# Patient Record
Sex: Male | Born: 1950
Health system: Southern US, Community
[De-identification: ages and names within clinical notes are randomized; demographics above are authoritative.]

## PROBLEM LIST (undated history)

## (undated) DIAGNOSIS — IMO0002 Reserved for concepts with insufficient information to code with codable children: Secondary | ICD-10-CM

## (undated) DIAGNOSIS — J45909 Unspecified asthma, uncomplicated: Secondary | ICD-10-CM

## (undated) DIAGNOSIS — M1990 Unspecified osteoarthritis, unspecified site: Secondary | ICD-10-CM

## (undated) DIAGNOSIS — R911 Solitary pulmonary nodule: Secondary | ICD-10-CM

## (undated) DIAGNOSIS — N189 Chronic kidney disease, unspecified: Secondary | ICD-10-CM

## (undated) DIAGNOSIS — E7800 Pure hypercholesterolemia, unspecified: Secondary | ICD-10-CM

## (undated) DIAGNOSIS — K56609 Unspecified intestinal obstruction, unspecified as to partial versus complete obstruction: Secondary | ICD-10-CM

## (undated) DIAGNOSIS — Z860100 Personal history of colon polyps, unspecified: Secondary | ICD-10-CM

## (undated) DIAGNOSIS — I2510 Atherosclerotic heart disease of native coronary artery without angina pectoris: Secondary | ICD-10-CM

## (undated) DIAGNOSIS — C61 Malignant neoplasm of prostate: Secondary | ICD-10-CM

## (undated) DIAGNOSIS — I10 Essential (primary) hypertension: Secondary | ICD-10-CM

## (undated) DIAGNOSIS — K219 Gastro-esophageal reflux disease without esophagitis: Secondary | ICD-10-CM

## (undated) DIAGNOSIS — IMO0001 Reserved for inherently not codable concepts without codable children: Secondary | ICD-10-CM

## (undated) HISTORY — PX: POLYPECTOMY: SHX149

## (undated) HISTORY — DX: Reserved for concepts with insufficient information to code with codable children: IMO0002

## (undated) HISTORY — DX: Personal history of colonic polyps: Z86.010

## (undated) HISTORY — DX: Unspecified osteoarthritis, unspecified site: M19.90

## (undated) HISTORY — DX: Unspecified intestinal obstruction, unspecified as to partial versus complete obstruction: K56.609

## (undated) HISTORY — PX: UPPER GASTROINTESTINAL ENDOSCOPY: SHX188

## (undated) HISTORY — DX: Solitary pulmonary nodule: R91.1

## (undated) HISTORY — DX: Gastro-esophageal reflux disease without esophagitis: K21.9

## (undated) HISTORY — DX: Atherosclerotic heart disease of native coronary artery without angina pectoris: I25.10

## (undated) HISTORY — PX: TONSILLECTOMY: SUR1361

## (undated) HISTORY — DX: Personal history of colon polyps, unspecified: Z86.0100

## (undated) HISTORY — PX: COLONOSCOPY: SHX174

## (undated) HISTORY — DX: Reserved for inherently not codable concepts without codable children: IMO0001

---

## 2001-05-28 ENCOUNTER — Inpatient Hospital Stay (HOSPITAL_COMMUNITY): Admission: EM | Admit: 2001-05-28 | Discharge: 2001-05-30 | Payer: Self-pay | Admitting: Emergency Medicine

## 2001-05-28 ENCOUNTER — Encounter: Payer: Self-pay | Admitting: Emergency Medicine

## 2001-05-29 ENCOUNTER — Encounter: Payer: Self-pay | Admitting: General Surgery

## 2006-12-11 ENCOUNTER — Ambulatory Visit: Payer: Self-pay | Admitting: Family Medicine

## 2007-01-21 ENCOUNTER — Ambulatory Visit: Payer: Self-pay | Admitting: Internal Medicine

## 2007-01-21 ENCOUNTER — Encounter: Payer: Self-pay | Admitting: Internal Medicine

## 2007-01-21 ENCOUNTER — Ambulatory Visit (HOSPITAL_COMMUNITY): Admission: RE | Admit: 2007-01-21 | Discharge: 2007-01-21 | Payer: Self-pay | Admitting: Internal Medicine

## 2008-12-17 ENCOUNTER — Encounter: Payer: Self-pay | Admitting: Cardiology

## 2008-12-23 ENCOUNTER — Ambulatory Visit: Payer: Self-pay | Admitting: Cardiology

## 2010-11-01 NOTE — Op Note (Signed)
NAME:  Gregory Malone, Gregory Malone            ACCOUNT NO.:  [id number]   MEDICAL RECORD NO.:  [id number]          PATIENT TYPE:  AMB   LOCATION:  DAY                           FACILITY:  [facility]   PHYSICIAN:  R. Roetta Sessions, M.D. DATE OF BIRTH:  1950-08-04   DATE OF PROCEDURE:  01/21/2007  DATE OF DISCHARGE:                               OPERATIVE REPORT   Colonoscopy with biopsy, snare polypectomy.   INDICATIONS FOR PROCEDURE:  The patient is a pleasant, 60 year old,  Caucasian male sent over courtesy of Dr. Joette Catching for colorectal  cancer screening.  He has never his lower GI tract imaged.  He tells me  he has a family history of diverticulosis but no history of colorectal  cancer.  He has no lower GI tract symptoms.  Colonoscopy is now being  done as standard screening maneuver.  This approach has discussed the  patient at length.  Potential risks, benefits and alternatives have been  reviewed, questions answered.  She is agreeable.  Please see  documentation in the medical record.   PROCEDURE NOTE:  O2 saturation, blood pressure, pulse and respirations  were monitored throughout the entire procedure.  Conscious sedation with  Versed 4 mg IV and Demerol 75 mg IV divided doses.   INSTRUMENT:  Pentax video chip system.   FINDINGS:  Digital rectal exam revealed no abnormalities.   ENDOSCOPIC FINDINGS:  The prep was adequate.   Colon:  Colonic mucosa was surveyed from rectosigmoid junction through  the left, transverse, and right colon to area of the appendiceal  orifice, ileocecal valve and cecum.  These structures were well seen and  photographed for the record.  From this level, scope was slowly and  cautiously withdrawn.  All previously mentioned mucosal surfaces were  again seen.  There were two diminutive polyps in the mid ascending colon  which were cold biopsied/removed.  The patient had a 7-mm polyp on a  stalk in the sigmoid colon which was hot snared, removed and  recovered.  The patient had left-sided diverticula.  Remainder of the colonic mucosa  appeared normal.  Scope was pulled down to the rectum where thorough  examination of the rectal mucosa including retroflexed view of the anal  verge demonstrated no rectal mucosal abnormalities.  The patient  tolerated the procedure well and was reactive to endoscopy.   IMPRESSION:  1. Normal rectum.  2. Left-sided diverticula.  3. Diminutive descending polyps cold biopsied/removed.  4. Pedunculated sigmoid polyp status post hot snare removal.   RECOMMENDATIONS:  1. No aspirin or arthritis medications for the next 10 days.  2. Diverticulosis and polyp literature provided for Gregory Malone.  3. Follow-up on pathology.  4. Further recommendations to follow.      Jonathon Bellows, M.D.  Electronically Signed     RMR/MEDQ  D:  01/21/2007  T:  01/21/2007  Job:  [id number]   cc:   Delaney Meigs, M.D.  Fax: [phone]

## 2010-11-01 NOTE — Assessment & Plan Note (Signed)
[facility]                            CARDIOLOGY OFFICE NOTE   Gregory Malone, Gregory Malone                   MRN:          [id number]  DATE:12/23/2008                            DOB:          [birth year]    PRIMARY ARE PHYSICIAN:  Helene Kelp, PA, [facility].   REASON FOR PRESENTATION:  Evaluate the patient with abnormal EKG.   HISTORY OF PRESENT ILLNESS:  The patient is a 60 year old gentleman with  no prior cardiac history.  Recently, during a routine physical, he was  noted to have an EKG with frequent premature atrial contractions.  However, the patient does not feel these contractions.  In particular,  he denies any palpitations.  He has never had any presyncope or syncope.  He says he was drinking quite a bit of caffeine at the time of that  appointment.  He denies any other cardiovascular symptoms and said that  he walked 6 miles recently without any problems.  Does not get any chest  pressure, neck, or arm discomfort.  He does not have any shortness of  breath and denies any PND or orthopnea.   PAST MEDICAL HISTORY:  Hypertension x5 years, nephrolithiasis, and  bursitis in his toes.   ALLERGIES:  None.   MEDICATIONS:  1. Hydrochlorothiazide 25 mg daily.  2. Amlodipine 5 mg daily.  3. Aspirin.   SOCIAL HISTORY:  The patient is divorced.  He has no children.  He is  retired.  He quit smoking in 1994 after 1 pack per day for 10 years.  He  does drink occasional beer.   REVIEW OF SYSTEMS:  As stated in the HPI, positive for snoring, reflux.  Negative for all other systems.   PHYSICAL EXAMINATION:  GENERAL:  The patient is pleasant and in no  distress.  VITAL SIGNS:  Blood pressure 124/62, heart rate 106 and regular, weight  238 pounds, and body mass index 39.  HEENT:  Eyelids are unremarkable; pupils equal, round, and reactive to  light; fundi not visualized; oral mucosa unremarkable.  NECK:  No jugular venous  distention at 45 degrees; carotid upstroke  brisk and symmetric; no bruits, no thyromegaly.  LYMPHATICS:  No cervical, axillary, or inguinal adenopathy.  LUNGS:  Clear to auscultation bilaterally.  BACK:  No costovertebral angle tenderness.  CHEST:  Unremarkable.  HEART:  PMI not displaced or sustained; S1 and S2 within normal limits;  no S3, no S4; no clicks, no rubs, no murmurs.  ABDOMEN:  Obese; positive  bowel sounds; normal in frequency and pitch; no bruits, no rebound, no  guarding; no midline pulsatile mass, no hepatomegaly, no splenomegaly.  SKIN:  No rashes, no nodules.  EXTREMITIES:  2+ pulses throughout; no edema, no cyanosis, no clubbing.  NEUROLOGIC:  Grossly intact.   EKG:  Sinus rhythm with premature atrial contractions, axis within  normal limits, intervals within normal limits, and no acute ST-T wave  changes.   ASSESSMENT AND PLAN:  1. Premature atrial contraction.  The patient has asymptomatic PACs.      At this point, no further cardiovascular testing is suggested.  I      do not suspect structural heart disease.  If these become      problematic in the future, he would need to reduce his caffeine      first, but I would be happy to reevaluate.  2. Obesity.  We had a long discussion about this.  He has particular      ideas about diet.  The bottom line is he needs to count calories      and I would suggest a low-glycemic index diet such as [facility].  3. Followup can be back in this clinic as needed.     Rollene Rotunda, MD, [facility]  Electronically Signed    JH/MedQ  DD: 12/23/2008  DT: 12/24/2008  Job #: [id number]   cc:   Helene Kelp, PA

## 2010-11-04 NOTE — H&P (Signed)
[facility]  Patient:    Gregory Malone, Gregory Malone Visit Number: [id number] MRN: [id number]          Service Type: MED Location: 3A [id number] Attending Physician:  Herbert Seta Dictated by:   Franky Macho, M.D. Admit Date:  05/28/2001   CC:         Colon Flattery, D.O.   History and Physical  PATIENT AGE:  60 years old.  REASON FOR ADMISSION:  Bowel obstruction.  HISTORY OF PRESENT ILLNESS:  The patient is a 60 year old white male who started having diarrhea and nonspecific abdominal pain four days ago.  He was seen by his primary care physician and was felt to have a type of viral syndrome versus gastroenteritis.  He presented to the emergency room today with worsening abdominal distention, nausea, and vomiting.  His diarrhea has since resolved.  He denies any fever or chills.  He denies any previous abdominal surgeries.  He denies any hematemesis, melena, hematochezia.  He had a recent rectal examination which was negative for blood.  There is no family history of colon carcinoma.  PAST MEDICAL HISTORY:  Includes hypertension and gout.  PAST SURGICAL HISTORY:  Unremarkable.  CURRENT MEDICATIONS:  Avapro for blood pressure, allopurinol.  ALLERGIES:  No known drug allergies.  REVIEW OF SYSTEMS:  Unremarkable.  PHYSICAL EXAMINATION:  GENERAL:  The patient is well-developed and well-nourished white male who is lying on the cart in mild discomfort.  VITAL SIGNS:  He is afebrile, and vital signs are stable.  LUNGS:  Clear to auscultation with equal breath sounds bilaterally.  HEART:  Regular rate and rhythm without S3, S4, or murmurs.  ABDOMEN:  Significantly distended with a reducible umbilical hernia.  No inguinal hernias are noted.  No hepatosplenomegaly or masses are noted.  RECTAL:  Deferred due to patient discomfort.  LABORATORY DATA:  MET-7 remarkable for potassium of 3.3, glucose 149.  Liver enzymes tests were within normal limits.   Amylase and lipase are within normal limits.  White blood cell count 15.1 with 87 segs, 8 lymphocytes.  Hematocrit 48, platelet count 240.  Acute abdominal series reveals significant small-bowel distention with some air noted in the right colon.  This is consistent with either a small-bowel obstruction or ileus.  Colon obstruction cannot be ruled out at this time.  IMPRESSION:  Bowel obstruction, question ileus versus mechanical, with an umbilical hernia.  PLAN:  The patient will be admitted to the hospital for intravenous hydration and nasogastric tube for decompression.  A CT scan of the abdomen and pelvis has been ordered for this evening.  Further management is pending the CT scan results. Dictated by:   Franky Macho, M.D. Attending Physician:  Herbert Seta DD:  05/28/01 TD:  05/28/01 Job: [id number]

## 2010-11-04 NOTE — Discharge Summary (Signed)
[facility]  Patient:    Gregory Malone, Gregory Malone Visit Number: [id number] MRN: [id number]          Service Type: MED Location: 3A [phone] Attending Physician:  Dalia Heading Dictated by:   Franky Macho, M.D. Admit Date:  05/28/2001 Discharge Date: 05/30/2001   CC:         Colon Flattery, D.O.   Discharge Summary  HOSPITAL COURSE:  The patient is a 60 year old white male who presented to the emergency room with abdominal distention, nausea, vomiting.  Abdominal films were obtained which revealed significant small bowel distention with air in the colon.  This was consistent with either a small bowel obstruction or an ileus.  A colonic obstruction could not be ruled out at the time.  A CT scan of the abdomen and pelvis was performed which revealed a partial small bowel obstruction with some narrowing at the terminal ileum.  No bowel wall thickening was noted and there was no completion of the obstruction.  The patients potassium was also noted to be low.  His white blood cell count was noted to be elevated at 15.1 at the time of admission.  He was admitted to the hospital under the surgical service for further evaluation and treatment.  He was started on Unasyn.  His white blood cell count subsequently dropped to 11.4.  A NG tube was attempted, but the patient did not tolerate it.  His potassium was corrected.  His follow up abdominal films revealed mild small bowel distention though it had somewhat resolved. His diet was then advanced without difficulty.  On hospital day #2, he was able to tolerate a soft diet without difficulty. He is having bowel movements and his abdominal pain has resolved.  His abdomen is less distended and there is no evidence of rigidity.  At this point, he did not have any evidence of an acute surgical abdomen.  The patient is being discharged home in good and improving condition.  DISCHARGE INSTRUCTIONS:  The patient is to  follow up with Dr. Franky Macho on June 04, 2001.  DISCHARGE MEDICATIONS: 1. Ciprofloxacin 500 mg p.o. b.i.d. x 5 days. 2. He is to resume his pre hospital medications as prescribed.  PRINCIPLE DIAGNOSES: 1. Partial small bowel obstruction, resolving, etiology unknown. 2. Hypertension. 3. History of gout.  PRINCIPLE PROCEDURES:  None. Dictated by:   Franky Macho, M.D. Attending Physician:  Dalia Heading DD:  05/30/01 TD:  05/30/01 Job: [id number] NF/[id number]

## 2012-03-26 HISTORY — PX: PROSTATE BIOPSY: SHX241

## 2012-05-13 ENCOUNTER — Encounter: Payer: Self-pay | Admitting: Radiation Oncology

## 2012-05-13 DIAGNOSIS — C61 Malignant neoplasm of prostate: Secondary | ICD-10-CM | POA: Insufficient documentation

## 2012-05-15 ENCOUNTER — Ambulatory Visit: Payer: Self-pay

## 2012-05-15 ENCOUNTER — Encounter: Payer: Self-pay | Admitting: Radiation Oncology

## 2012-05-15 ENCOUNTER — Ambulatory Visit
Admission: RE | Admit: 2012-05-15 | Discharge: 2012-05-15 | Disposition: A | Discharge status: Home / Self Care | Payer: PRIVATE HEALTH INSURANCE | Source: Ambulatory Visit | Attending: Radiation Oncology | Admitting: Radiation Oncology

## 2012-05-15 ENCOUNTER — Ambulatory Visit: Payer: Self-pay | Admitting: Radiation Oncology

## 2012-05-15 VITALS — BP 108/75 | HR 89 | Temp 98.3°F | Resp 20 | Ht 64.0 in | Wt 237.0 lb

## 2012-05-15 DIAGNOSIS — J45909 Unspecified asthma, uncomplicated: Secondary | ICD-10-CM | POA: Insufficient documentation

## 2012-05-15 DIAGNOSIS — C61 Malignant neoplasm of prostate: Secondary | ICD-10-CM

## 2012-05-15 DIAGNOSIS — I129 Hypertensive chronic kidney disease with stage 1 through stage 4 chronic kidney disease, or unspecified chronic kidney disease: Secondary | ICD-10-CM | POA: Insufficient documentation

## 2012-05-15 DIAGNOSIS — N189 Chronic kidney disease, unspecified: Secondary | ICD-10-CM | POA: Insufficient documentation

## 2012-05-15 HISTORY — DX: Unspecified asthma, uncomplicated: J45.909

## 2012-05-15 HISTORY — DX: Essential (primary) hypertension: I10

## 2012-05-15 HISTORY — DX: Malignant neoplasm of prostate: C61

## 2012-05-15 HISTORY — DX: Pure hypercholesterolemia, unspecified: E78.00

## 2012-05-15 HISTORY — DX: Chronic kidney disease, unspecified: N18.9

## 2012-05-15 NOTE — Progress Notes (Signed)
Please see the Nurse Progress Note in the MD Initial Consult Encounter for this patient. 

## 2012-05-15 NOTE — Progress Notes (Addendum)
[facility] Radiation Oncology NEW PATIENT EVALUATION  Name: Gregory Malone MRN: [id number]  Date:   05/15/2012           DOB: 1950-10-24  Status: outpatient   CC:   Valetta Fuller, MD   Helene Kelp. PA   REFERRING PHYSICIAN: Valetta Fuller, MD   DIAGNOSIS: Stage TI C. favorable risk adenocarcinoma prostate   HISTORY OF PRESENT ILLNESS:  Gregory Malone is a 61 y.o. male who is seen today for the courtesy of Dr. Isabel Caprice for discussion of possible radiation therapy in the management of his stage TI C. favorable risk adenocarcinoma prostate. He was noted to have a rise in his PSA from 2.3 in 2012 to 4.22 this past 01/29/2012. He was referred by Helene Kelp, PA  to Dr. Isabel Caprice for further evaluation. He underwent ultrasound-guided biopsies on 03/26/2012 with a diagnosis of adenocarcinoma, Gleason 6 (3+3) involving 20% of one core from the left base and 5% of one core from the left lateral apex. He noted areas of high-grade PIN and atypia. His gland volume was approximately 34 cc. He is doing well from a GU and GI standpoint. His I PSS score is 4. He is potent.  PREVIOUS RADIATION THERAPY: No   PAST MEDICAL HISTORY:  has a past medical history of Prostate cancer (03/26/12 bx); Asthma; Hypertension; Hypercholesterolemia; and Chronic kidney disease.     PAST SURGICAL HISTORY:  Past Surgical History  Procedure Date  . Tonsillectomy   . Prostate biopsy 03/26/12    adenocarcinoma     FAMILY HISTORY: family history includes Cancer in his cousin and paternal uncle; Cancer (age of onset:50) in his maternal grandfather; and Cancer (age of onset:71) in his father.  I treated father for prostate cancer almost 18 years ago at the [facility] in [place] . He died at age 32 of unrelated causes. His mother is alive with Alzheimer's disease at age 91. His maternal grandfather was diagnosed with prostate cancer at age 59.   SOCIAL HISTORY:  reports that he has quit  smoking. His smoking use included Cigarettes. He has a 10 pack-year smoking history. He has never used smokeless tobacco. He reports that he drinks alcohol. He reports that he does not use illicit drugs. Divorced, no children. 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   ALLERGIES: Review of patient's allergies indicates no known allergies.   MEDICATIONS:  Current Outpatient Prescriptions  Medication Sig Dispense Refill  . amLODipine (NORVASC) 5 MG tablet Take 5 mg by mouth daily.      Marland Kitchen atorvastatin (LIPITOR) 40 MG tablet Take 40 mg by mouth daily.      . hydrochlorothiazide (HYDRODIURIL) 25 MG tablet Take 25 mg by mouth daily.      Marland Kitchen ibuprofen (ADVIL,MOTRIN) 200 MG tablet Take 200 mg by mouth every 6 (six) hours as needed.         REVIEW OF SYSTEMS:  Pertinent items are noted in HPI.    PHYSICAL EXAM:  height is 5\' 4"  (1.626 m) and weight is 237 lb (107.502 kg). His oral temperature is 98.3 F (36.8 C). His blood pressure is 108/75 and his pulse is 89. His respiration is 20.   Alert and oriented. Head and neck examination: Grossly unremarkable. Nodes: Without palpable cervical or supraclavicular lymphadenopathy. Chest: Lungs clear. Back: Without spinal or CVA tenderness. Heart: Regular rate and  rhythm. Abdomen: Without masses organomegaly. Genitalia: Unremarkable to inspection. Rectal: Prostate gland is normal in size and is without focal induration or nodularity. Extremities: Without edema. Neurologic examination: Grossly nonfocal.   LABORATORY DATA:  No results found for this basename: WBC, HGB, HCT, MCV, PLT   No results found for this basename: NA, K, CL, CO2   No results found for this basename: ALT, AST, GGT, ALKPHOS, BILITOT   PSA 4.22 from 01/29/2012.   IMPRESSION: Stage TI C. favorable risk adenocarcinoma prostate. I explained to the patient that his prognosis is related to his stage, PSA level, and  Gleason score. All are favorable. We discussed surgery versus close surveillance versus radiation therapy. We discussed his radiation therapy options which include seed implantation alone or 8 weeks of external beam/IMRT. We discussed the potential acute and late toxicities of radiation therapy. I think a seed implant would be an excellent choice for him. We talked about the need to obtain a CT arch study prior to scheduling a seed implant. He will think things over and get back in touch with me after he is made a final decision. He was given seed implantation literature for review.   PLAN: As discussed above.   I spent 60 minutes minutes face to face with the patient and more than 50% of that time was spent in counseling and/or coordination of care.

## 2012-05-15 NOTE — Addendum Note (Signed)
Encounter addended by: Delynn Flavin, RN on: 05/15/2012  7:22 PM<BR>     Documentation filed: Charges VN

## 2012-05-15 NOTE — Progress Notes (Signed)
New Consult Prostate Cancer BX=03/26/12=Adenocarcinoma gleason= 3+3=6,PSA=4.22,volume=34cc PSA this year 4.22 PSA 2012=2.3 Single, Retired Chemical engineer Alert,oriented x3, no dysuria, no pain, interested in Radiation only here    His  Father  along with his  ,maternal grandfather, Kateri Mc,  And cousin dx with prostate cancer His Father died leukemia not prostate cancer, he was treated with radiation with Dr.Murray 08/24/1993 in [facility]     Allergies:NKDA

## 2012-05-27 ENCOUNTER — Telehealth: Payer: Self-pay | Admitting: *Deleted

## 2012-05-27 ENCOUNTER — Encounter: Payer: Self-pay | Admitting: Radiation Oncology

## 2012-05-27 NOTE — Telephone Encounter (Signed)
Called patient to inform of sim appt. For 06-17-12  At 11:00 am, spoke with patient and he is aware of this appt.

## 2012-05-27 NOTE — Telephone Encounter (Signed)
xxxx 

## 2012-05-27 NOTE — Progress Notes (Signed)
The patient called back, and would like to proceed with IMRT rather than seed implantation.

## 2012-05-28 ENCOUNTER — Institutional Professional Consult (permissible substitution): Payer: Self-pay | Admitting: Radiation Oncology

## 2012-05-28 ENCOUNTER — Ambulatory Visit: Payer: Self-pay

## 2012-06-14 ENCOUNTER — Telehealth: Payer: Self-pay | Admitting: *Deleted

## 2012-06-14 NOTE — Telephone Encounter (Signed)
CALLED PATIENT TO REMIND OF APPT. FOR 06-17-12 , SPOKE WITH PATIENT AND HE IS AWARE OF THIS APPT.

## 2012-06-17 ENCOUNTER — Encounter: Payer: Self-pay | Admitting: Radiation Oncology

## 2012-06-17 ENCOUNTER — Ambulatory Visit
Admission: RE | Admit: 2012-06-17 | Discharge: 2012-06-17 | Disposition: A | Discharge status: Home / Self Care | Payer: PRIVATE HEALTH INSURANCE | Source: Ambulatory Visit | Attending: Radiation Oncology | Admitting: Radiation Oncology

## 2012-06-17 DIAGNOSIS — C61 Malignant neoplasm of prostate: Secondary | ICD-10-CM

## 2012-06-17 DIAGNOSIS — Z510 Encounter for antineoplastic radiation therapy: Secondary | ICD-10-CM | POA: Insufficient documentation

## 2012-06-17 NOTE — Progress Notes (Signed)
Simulation/treatment planning note:  Gregory Malone underwent simulation/treatment planning in the management of his favorable risk adenocarcinoma prostate. A Vac Loc immobilization device was constructed. A red rubber catheter was placed in the rectal vault. He was then catheterized and contrast instilled into the bladder/urethra. I contoured his prostate, seminal vesicles, rectum, bladder, and rectosigmoid colon. I am prescribing 7600 cGy in 38 sessions utilizing 6 MV photons VMAT IMRT. I prescribing 5700 cGy 30 sessions to his seminal vesicles. He'll undergo daily KV imaging sitting up to his gold seeds and weekly cone beam CT to assess his bladder filling. He is now ready for IMRT simulation/treatment planning.

## 2012-06-17 NOTE — Progress Notes (Signed)
Met with patient to discuss RO billing.  Patient had no concerns today.  Told patient IMRT was already approved.

## 2012-06-25 ENCOUNTER — Encounter: Payer: Self-pay | Admitting: Radiation Oncology

## 2012-06-25 NOTE — Progress Notes (Signed)
IMRT simulation/treatment planning note:  The patient completed his IMRT simulation/treatment planning in the management of his carcinoma the prostate. IMRT was chosen to decrease the risk for both acute and late bladder toxicity compared to conventional or 3-D conformal radiation therapy. Dose volume histograms were obtained for the target structures including the prostate and seminal vesicles and also avoidance structures including the bladder, rectum and femoral heads. He had a small rectal diameter, and thus was difficult to meet our avoidance goals with respect to the rectum. He is being treated with 3 volume modulated arcs with 10 MV photons. I accepted a PTV coverage of just over 95% for his prescription dose to the prostate in order to limit the dose to his rectum. Otherwise, we met the departmental goals. He'll undergo daily KV imaging, setting up to his 3 gold seeds and also a weekly cone beam CT scan to assess his bladder filling. Please see the electronic medical record for specific dose volume histograms.

## 2012-06-27 ENCOUNTER — Ambulatory Visit
Admission: RE | Admit: 2012-06-27 | Discharge: 2012-06-27 | Disposition: A | Discharge status: Home / Self Care | Payer: PRIVATE HEALTH INSURANCE | Source: Ambulatory Visit | Attending: Radiation Oncology | Admitting: Radiation Oncology

## 2012-06-27 DIAGNOSIS — C61 Malignant neoplasm of prostate: Secondary | ICD-10-CM

## 2012-06-27 NOTE — Progress Notes (Signed)
Simulation verification note: The patient was simulation verification prior to his first treatment in the management of his carcinoma the prostate. He underwent KV imaging, setting up to his gold seeds.

## 2012-06-28 ENCOUNTER — Ambulatory Visit
Admission: RE | Admit: 2012-06-28 | Discharge: 2012-06-28 | Disposition: A | Discharge status: Home / Self Care | Payer: PRIVATE HEALTH INSURANCE | Source: Ambulatory Visit | Attending: Radiation Oncology | Admitting: Radiation Oncology

## 2012-07-01 ENCOUNTER — Ambulatory Visit
Admission: RE | Admit: 2012-07-01 | Discharge: 2012-07-01 | Disposition: A | Discharge status: Home / Self Care | Payer: PRIVATE HEALTH INSURANCE | Source: Ambulatory Visit | Attending: Radiation Oncology | Admitting: Radiation Oncology

## 2012-07-01 ENCOUNTER — Encounter: Payer: Self-pay | Admitting: Radiation Oncology

## 2012-07-01 VITALS — BP 133/84 | HR 93 | Temp 99.1°F | Resp 20 | Wt 242.1 lb

## 2012-07-01 DIAGNOSIS — C61 Malignant neoplasm of prostate: Secondary | ICD-10-CM

## 2012-07-01 NOTE — Progress Notes (Signed)
Post sim ed completed; documented under post sim appt. Pt denies pain, urinary, bowel issues, fatigue, loss of appetite.

## 2012-07-01 NOTE — Progress Notes (Signed)
Weekly Management Note:  Site: Prostate Current Dose:  600  cGy Projected Dose: 7600  cGy  Narrative: The patient is seen today for routine under treatment assessment. CBCT/MVCT images/port films were reviewed. The chart was reviewed.   Bladder filling is satisfactory. No new GU or GI difficulties.  Physical Examination:  Filed Vitals:   07/01/12 1440  BP: 133/84  Pulse: 93  Temp: 99.1 F (37.3 C)  Resp: 20  .  Weight: 242 lb 1.6 oz (109.816 kg). No change .  Impression: Tolerating radiation therapy well.  Plan: Continue radiation therapy as planned.

## 2012-07-02 ENCOUNTER — Ambulatory Visit
Admission: RE | Admit: 2012-07-02 | Discharge: 2012-07-02 | Disposition: A | Discharge status: Home / Self Care | Payer: PRIVATE HEALTH INSURANCE | Source: Ambulatory Visit | Attending: Radiation Oncology | Admitting: Radiation Oncology

## 2012-07-02 NOTE — Progress Notes (Signed)
Post sim ed completed w/pt. Gave pt "Radiation and You" booklet w/all pertinent pages/information marked and discussed, re: fatigue, diarrhea, skin care, nutrition, pain, urinary/bladder changes/management. All questions answered.

## 2012-07-03 ENCOUNTER — Ambulatory Visit
Admission: RE | Admit: 2012-07-03 | Discharge: 2012-07-03 | Disposition: A | Discharge status: Home / Self Care | Payer: PRIVATE HEALTH INSURANCE | Source: Ambulatory Visit | Attending: Radiation Oncology | Admitting: Radiation Oncology

## 2012-07-04 ENCOUNTER — Ambulatory Visit
Admission: RE | Admit: 2012-07-04 | Discharge: 2012-07-04 | Disposition: A | Discharge status: Home / Self Care | Payer: PRIVATE HEALTH INSURANCE | Source: Ambulatory Visit | Attending: Radiation Oncology | Admitting: Radiation Oncology

## 2012-07-05 ENCOUNTER — Ambulatory Visit
Admission: RE | Admit: 2012-07-05 | Discharge: 2012-07-05 | Disposition: A | Discharge status: Home / Self Care | Payer: PRIVATE HEALTH INSURANCE | Source: Ambulatory Visit | Attending: Radiation Oncology | Admitting: Radiation Oncology

## 2012-07-08 ENCOUNTER — Ambulatory Visit
Admission: RE | Admit: 2012-07-08 | Discharge: 2012-07-08 | Disposition: A | Discharge status: Home / Self Care | Payer: PRIVATE HEALTH INSURANCE | Source: Ambulatory Visit | Attending: Radiation Oncology | Admitting: Radiation Oncology

## 2012-07-08 ENCOUNTER — Encounter: Payer: Self-pay | Admitting: Radiation Oncology

## 2012-07-08 VITALS — BP 111/68 | HR 74 | Temp 98.4°F | Resp 20 | Wt 239.7 lb

## 2012-07-08 DIAGNOSIS — C61 Malignant neoplasm of prostate: Secondary | ICD-10-CM

## 2012-07-08 NOTE — Progress Notes (Signed)
Weekly Management Note:  Site: Prostate Current Dose:  1600  cGy Projected Dose: 7600  cGy  Narrative: The patient is seen today for routine under treatment assessment. CBCT/MVCT images/port films were reviewed. The chart was reviewed.   Satisfactory bladder filling. No GU or GI difficulties.  Physical Examination:  Filed Vitals:   07/08/12 1001  BP: 111/68  Pulse: 74  Temp: 98.4 F (36.9 C)  Resp: 20  .  Weight: 239 lb 11.2 oz (108.727 kg). No change.  Impression: Tolerating radiation therapy well.  Plan: Continue radiation therapy as planned.

## 2012-07-08 NOTE — Progress Notes (Signed)
Patient here for weekly rad txs prostate  8/38  Completed,  No dysuria, no frequency or urgency, bowels regular no c/o 10:05 AM

## 2012-07-09 ENCOUNTER — Ambulatory Visit
Admission: RE | Admit: 2012-07-09 | Discharge: 2012-07-09 | Disposition: A | Discharge status: Home / Self Care | Payer: PRIVATE HEALTH INSURANCE | Source: Ambulatory Visit | Attending: Radiation Oncology | Admitting: Radiation Oncology

## 2012-07-10 ENCOUNTER — Ambulatory Visit
Admission: RE | Admit: 2012-07-10 | Discharge: 2012-07-10 | Disposition: A | Discharge status: Home / Self Care | Payer: PRIVATE HEALTH INSURANCE | Source: Ambulatory Visit | Attending: Radiation Oncology | Admitting: Radiation Oncology

## 2012-07-11 ENCOUNTER — Ambulatory Visit
Admission: RE | Admit: 2012-07-11 | Discharge: 2012-07-11 | Disposition: A | Discharge status: Home / Self Care | Payer: PRIVATE HEALTH INSURANCE | Source: Ambulatory Visit | Attending: Radiation Oncology | Admitting: Radiation Oncology

## 2012-07-12 ENCOUNTER — Ambulatory Visit
Admission: RE | Admit: 2012-07-12 | Discharge: 2012-07-12 | Disposition: A | Discharge status: Home / Self Care | Payer: PRIVATE HEALTH INSURANCE | Source: Ambulatory Visit | Attending: Radiation Oncology | Admitting: Radiation Oncology

## 2012-07-15 ENCOUNTER — Encounter: Payer: Self-pay | Admitting: Radiation Oncology

## 2012-07-15 ENCOUNTER — Ambulatory Visit
Admission: RE | Admit: 2012-07-15 | Discharge: 2012-07-15 | Disposition: A | Discharge status: Home / Self Care | Payer: PRIVATE HEALTH INSURANCE | Source: Ambulatory Visit | Attending: Radiation Oncology | Admitting: Radiation Oncology

## 2012-07-15 VITALS — BP 125/87 | HR 88 | Temp 97.3°F | Resp 20 | Wt 239.0 lb

## 2012-07-15 DIAGNOSIS — C61 Malignant neoplasm of prostate: Secondary | ICD-10-CM

## 2012-07-15 NOTE — Progress Notes (Signed)
Pt reports slight fatigue twice last week. He denies urinary, bowel issues, loss of appetite.

## 2012-07-15 NOTE — Progress Notes (Signed)
Weekly Management Note:  Site: Prostate Current Dose:  2600  cGy Projected Dose: 7600  cGy  Narrative: The patient is seen today for routine under treatment assessment. CBCT/MVCT images/port films were reviewed. The chart was reviewed.   Bladder filling satisfactory today. No significant GU or GI difficulties.  Physical Examination:  Filed Vitals:   07/15/12 1009  BP: 125/87  Pulse: 88  Temp: 97.3 F (36.3 C)  Resp: 20  .  Weight: 239 lb (108.41 kg). No change.  Impression: Tolerating radiation therapy well.  Plan: Continue radiation therapy as planned.

## 2012-07-16 ENCOUNTER — Ambulatory Visit
Admission: RE | Admit: 2012-07-16 | Discharge: 2012-07-16 | Disposition: A | Discharge status: Home / Self Care | Payer: PRIVATE HEALTH INSURANCE | Source: Ambulatory Visit | Attending: Radiation Oncology | Admitting: Radiation Oncology

## 2012-07-17 ENCOUNTER — Ambulatory Visit
Admission: RE | Admit: 2012-07-17 | Discharge: 2012-07-17 | Disposition: A | Discharge status: Home / Self Care | Payer: PRIVATE HEALTH INSURANCE | Source: Ambulatory Visit | Attending: Radiation Oncology | Admitting: Radiation Oncology

## 2012-07-18 ENCOUNTER — Ambulatory Visit
Admission: RE | Admit: 2012-07-18 | Discharge: 2012-07-18 | Disposition: A | Discharge status: Home / Self Care | Payer: PRIVATE HEALTH INSURANCE | Source: Ambulatory Visit | Attending: Radiation Oncology | Admitting: Radiation Oncology

## 2012-07-19 ENCOUNTER — Ambulatory Visit
Admission: RE | Admit: 2012-07-19 | Discharge: 2012-07-19 | Disposition: A | Discharge status: Home / Self Care | Payer: PRIVATE HEALTH INSURANCE | Source: Ambulatory Visit | Attending: Radiation Oncology | Admitting: Radiation Oncology

## 2012-07-22 ENCOUNTER — Ambulatory Visit
Admission: RE | Admit: 2012-07-22 | Discharge: 2012-07-22 | Disposition: A | Discharge status: Home / Self Care | Payer: PRIVATE HEALTH INSURANCE | Source: Ambulatory Visit | Attending: Radiation Oncology | Admitting: Radiation Oncology

## 2012-07-22 DIAGNOSIS — C61 Malignant neoplasm of prostate: Secondary | ICD-10-CM

## 2012-07-22 NOTE — Progress Notes (Signed)
S/p rad txs 18/38 completed prostate, no  Dysuria , bowels regular, no changes stated patient, no pain

## 2012-07-22 NOTE — Progress Notes (Signed)
Weekly Management Note:  Site: Prostate Current Dose:  3600  cGy Projected Dose: 7600  cGy  Narrative: The patient is seen today for routine under treatment assessment. CBCT/MVCT images/port films were reviewed. The chart was reviewed.   Bladder filling satisfactory. No GU or GI difficulties.  Physical Examination: There were no vitals filed for this visit..  Weight:  . No change.  Impression: Tolerating radiation therapy well.  Plan: Continue radiation therapy as planned.

## 2012-07-23 ENCOUNTER — Ambulatory Visit
Admission: RE | Admit: 2012-07-23 | Discharge: 2012-07-23 | Disposition: A | Discharge status: Home / Self Care | Payer: PRIVATE HEALTH INSURANCE | Source: Ambulatory Visit | Attending: Radiation Oncology | Admitting: Radiation Oncology

## 2012-07-24 ENCOUNTER — Ambulatory Visit
Admission: RE | Admit: 2012-07-24 | Discharge: 2012-07-24 | Disposition: A | Discharge status: Home / Self Care | Payer: PRIVATE HEALTH INSURANCE | Source: Ambulatory Visit | Attending: Radiation Oncology | Admitting: Radiation Oncology

## 2012-07-25 ENCOUNTER — Ambulatory Visit
Admission: RE | Admit: 2012-07-25 | Discharge: 2012-07-25 | Disposition: A | Discharge status: Home / Self Care | Payer: PRIVATE HEALTH INSURANCE | Source: Ambulatory Visit | Attending: Radiation Oncology | Admitting: Radiation Oncology

## 2012-07-26 ENCOUNTER — Ambulatory Visit
Admission: RE | Admit: 2012-07-26 | Discharge: 2012-07-26 | Disposition: A | Discharge status: Home / Self Care | Payer: PRIVATE HEALTH INSURANCE | Source: Ambulatory Visit | Attending: Radiation Oncology | Admitting: Radiation Oncology

## 2012-07-29 ENCOUNTER — Ambulatory Visit
Admission: RE | Admit: 2012-07-29 | Discharge: 2012-07-29 | Disposition: A | Discharge status: Home / Self Care | Payer: PRIVATE HEALTH INSURANCE | Source: Ambulatory Visit | Attending: Radiation Oncology | Admitting: Radiation Oncology

## 2012-07-29 ENCOUNTER — Encounter: Payer: Self-pay | Admitting: Radiation Oncology

## 2012-07-29 VITALS — BP 119/71 | HR 84 | Temp 98.5°F | Resp 20 | Wt 244.6 lb

## 2012-07-29 DIAGNOSIS — C61 Malignant neoplasm of prostate: Secondary | ICD-10-CM

## 2012-07-29 NOTE — Addendum Note (Signed)
Encounter addended by: Maryln Gottron, MD on: 07/29/2012 11:48 AM<BR>     Documentation filed: Notes Section

## 2012-07-29 NOTE — Progress Notes (Signed)
Patient completed 23/38 prosatte rad txs, no c/o pain, or dysuria, regular bowels, slight fatigue at time,s exercised walking in the woods this weekend,eating well, drinks mostly diet sodas, juices 11:40 AM

## 2012-07-29 NOTE — Progress Notes (Addendum)
Weekly Management Note:  Site: Prostate Current Dose:  4600  cGy Projected Dose: 7600  cGy  Narrative: The patient is seen today for routine under treatment assessment. CBCT/MVCT images/port films were reviewed. The chart was reviewed.   Excellent bladder filling. No new GU or GI difficulties. He does have occasional fatigue.  Physical Examination:  Filed Vitals:   07/29/12 1137  BP: 119/71  Pulse: 84  Temp: 98.5 F (36.9 C)  Resp: 20  .  Weight: 244 lb 9.6 oz (110.95 kg). No change.  Impression: Tolerating radiation therapy well.  Plan: Continue radiation therapy as planned.

## 2012-07-30 ENCOUNTER — Ambulatory Visit
Admission: RE | Admit: 2012-07-30 | Discharge: 2012-07-30 | Disposition: A | Discharge status: Home / Self Care | Payer: PRIVATE HEALTH INSURANCE | Source: Ambulatory Visit | Attending: Radiation Oncology | Admitting: Radiation Oncology

## 2012-07-31 ENCOUNTER — Ambulatory Visit
Admission: RE | Admit: 2012-07-31 | Discharge: 2012-07-31 | Disposition: A | Discharge status: Home / Self Care | Payer: PRIVATE HEALTH INSURANCE | Source: Ambulatory Visit | Attending: Radiation Oncology | Admitting: Radiation Oncology

## 2012-08-01 ENCOUNTER — Ambulatory Visit: Payer: PRIVATE HEALTH INSURANCE

## 2012-08-02 ENCOUNTER — Ambulatory Visit
Admission: RE | Admit: 2012-08-02 | Discharge: 2012-08-02 | Disposition: A | Discharge status: Home / Self Care | Payer: PRIVATE HEALTH INSURANCE | Source: Ambulatory Visit | Attending: Radiation Oncology | Admitting: Radiation Oncology

## 2012-08-05 ENCOUNTER — Ambulatory Visit
Admission: RE | Admit: 2012-08-05 | Discharge: 2012-08-05 | Disposition: A | Discharge status: Home / Self Care | Payer: PRIVATE HEALTH INSURANCE | Source: Ambulatory Visit | Attending: Radiation Oncology | Admitting: Radiation Oncology

## 2012-08-05 ENCOUNTER — Encounter: Payer: Self-pay | Admitting: Radiation Oncology

## 2012-08-05 VITALS — BP 144/84 | HR 97 | Resp 18 | Wt 243.2 lb

## 2012-08-05 DIAGNOSIS — C61 Malignant neoplasm of prostate: Secondary | ICD-10-CM

## 2012-08-05 NOTE — Progress Notes (Signed)
Weekly Management Note:  Site: Prostate Current Dose:  5400  cGy Projected Dose: 7600  cGy  Narrative: The patient is seen today for routine under treatment assessment. CBCT/MVCT images/port films were reviewed. The chart was reviewed.   Excellent bladder filling. No significant GU or GI difficulties. He does report slight dysuria.  Physical Examination:  Filed Vitals:   08/05/12 1127  BP: 144/84  Pulse: 97  Resp: 18  .  Weight: 243 lb 3.2 oz (110.315 kg). No change.   Impression: Tolerating radiation therapy well.  Plan: Continue radiation therapy as planned.

## 2012-08-05 NOTE — Progress Notes (Signed)
Patient presents to the clinic today unaccompanied for PUT with Dr. Dayton Scrape. Patient alert and oriented to person, place, and time. No distress noted. Steady gait noted. Pleasant affect noted. Patient denies pain at this time. Patient denies burning with urination. Patient denies diarrhea. Patient reports on average he gets up once during the night to void. Patient denies hematuria. Patient reports mild fatigue. Reported all findings to Dr. Dayton Scrape

## 2012-08-06 ENCOUNTER — Ambulatory Visit
Admission: RE | Admit: 2012-08-06 | Discharge: 2012-08-06 | Disposition: A | Discharge status: Home / Self Care | Payer: PRIVATE HEALTH INSURANCE | Source: Ambulatory Visit | Attending: Radiation Oncology | Admitting: Radiation Oncology

## 2012-08-07 ENCOUNTER — Ambulatory Visit
Admission: RE | Admit: 2012-08-07 | Discharge: 2012-08-07 | Disposition: A | Discharge status: Home / Self Care | Payer: PRIVATE HEALTH INSURANCE | Source: Ambulatory Visit | Attending: Radiation Oncology | Admitting: Radiation Oncology

## 2012-08-08 ENCOUNTER — Ambulatory Visit
Admission: RE | Admit: 2012-08-08 | Discharge: 2012-08-08 | Disposition: A | Discharge status: Home / Self Care | Payer: PRIVATE HEALTH INSURANCE | Source: Ambulatory Visit | Attending: Radiation Oncology | Admitting: Radiation Oncology

## 2012-08-09 ENCOUNTER — Ambulatory Visit
Admission: RE | Admit: 2012-08-09 | Discharge: 2012-08-09 | Disposition: A | Discharge status: Home / Self Care | Payer: PRIVATE HEALTH INSURANCE | Source: Ambulatory Visit | Attending: Radiation Oncology | Admitting: Radiation Oncology

## 2012-08-12 ENCOUNTER — Ambulatory Visit
Admission: RE | Admit: 2012-08-12 | Discharge: 2012-08-12 | Disposition: A | Discharge status: Home / Self Care | Payer: PRIVATE HEALTH INSURANCE | Source: Ambulatory Visit | Attending: Radiation Oncology | Admitting: Radiation Oncology

## 2012-08-12 VITALS — BP 129/78 | HR 92 | Temp 98.2°F | Wt 243.0 lb

## 2012-08-12 DIAGNOSIS — C61 Malignant neoplasm of prostate: Secondary | ICD-10-CM

## 2012-08-12 NOTE — Progress Notes (Signed)
Mr. Gwynne here for routine put.  He is receiving radiation treatment for prostate cancer. He reports having to urinate 2 times during the night.  He denies pain on urination, incontinence and any problems with his bowels.

## 2012-08-12 NOTE — Progress Notes (Signed)
Weekly Management Note:  Site: Prostate Current Dose:  6400  cGy Projected Dose: 7600  cGy  Narrative: The patient is seen today for routine under treatment assessment. CBCT/MVCT images/port films were reviewed. The chart was reviewed.   Bladder filling is satisfactory. No GU or GI difficulties although he does omit to some increasing urinary frequency/urgency.  Physical Examination:  Filed Vitals:   08/12/12 1800  BP: 129/78  Pulse: 92  Temp: 98.2 F (36.8 C)  .  Weight: 243 lb (110.224 kg). No change.  Impression: Tolerating radiation therapy well.  Plan: Continue radiation therapy as planned.

## 2012-08-13 ENCOUNTER — Ambulatory Visit
Admission: RE | Admit: 2012-08-13 | Discharge: 2012-08-13 | Disposition: A | Discharge status: Home / Self Care | Payer: PRIVATE HEALTH INSURANCE | Source: Ambulatory Visit | Attending: Radiation Oncology | Admitting: Radiation Oncology

## 2012-08-14 ENCOUNTER — Ambulatory Visit
Admission: RE | Admit: 2012-08-14 | Discharge: 2012-08-14 | Disposition: A | Discharge status: Home / Self Care | Payer: PRIVATE HEALTH INSURANCE | Source: Ambulatory Visit | Attending: Radiation Oncology | Admitting: Radiation Oncology

## 2012-08-15 ENCOUNTER — Ambulatory Visit
Admission: RE | Admit: 2012-08-15 | Discharge: 2012-08-15 | Disposition: A | Discharge status: Home / Self Care | Payer: PRIVATE HEALTH INSURANCE | Source: Ambulatory Visit | Attending: Radiation Oncology | Admitting: Radiation Oncology

## 2012-08-16 ENCOUNTER — Ambulatory Visit
Admission: RE | Admit: 2012-08-16 | Discharge: 2012-08-16 | Disposition: A | Discharge status: Home / Self Care | Payer: PRIVATE HEALTH INSURANCE | Source: Ambulatory Visit | Attending: Radiation Oncology | Admitting: Radiation Oncology

## 2012-08-19 ENCOUNTER — Ambulatory Visit
Admission: RE | Admit: 2012-08-19 | Discharge: 2012-08-19 | Disposition: A | Discharge status: Home / Self Care | Payer: PRIVATE HEALTH INSURANCE | Source: Ambulatory Visit | Attending: Radiation Oncology | Admitting: Radiation Oncology

## 2012-08-20 ENCOUNTER — Ambulatory Visit
Admission: RE | Admit: 2012-08-20 | Discharge: 2012-08-20 | Disposition: A | Discharge status: Home / Self Care | Payer: PRIVATE HEALTH INSURANCE | Source: Ambulatory Visit | Attending: Radiation Oncology | Admitting: Radiation Oncology

## 2012-08-20 ENCOUNTER — Encounter: Payer: Self-pay | Admitting: Radiation Oncology

## 2012-08-20 ENCOUNTER — Ambulatory Visit: Payer: PRIVATE HEALTH INSURANCE

## 2012-08-20 VITALS — BP 135/84 | HR 91 | Temp 98.3°F | Wt 245.9 lb

## 2012-08-20 NOTE — Progress Notes (Signed)
Chart note: On 06/27/2012 Mr. Gregory Malone began his radiation therapy. He was treated with IMRT with 2 modulated arcs utilizing dynamic MLCs corresponding to one set of IMRT treatment devices (40981).

## 2012-08-20 NOTE — Progress Notes (Signed)
[facility] Radiation Oncology End of Treatment Note  Name:Gregory Malone  Date: 08/20/2012 ZOX:[id number] DOB:January 03, 1951   Status:outpatient    CC:  Helene Kelp, P.A., Dr. Barron Alvine  REFERRING PHYSICIAN:   Dr. Barron Alvine   DIAGNOSIS:  Stage TI C. favorable risk adenocarcinoma prostate  INDICATION FOR TREATMENT: Curative   TREATMENT DATES: 06/27/2012 through 08/20/2012                          SITE/DOSE:  Prostate 7600 cGy 38 sessions                        BEAMS/ENERGY:   6 MV photons, IMRT with dual modulated arcs                NARRATIVE:   [name] tolerated treatment beautifully with only slight urinary urgency by completion of therapy.                         PLAN: Routine followup in one month. Patient instructed to call if questions or worsening complaints in interim.

## 2012-08-20 NOTE — Progress Notes (Signed)
Gregory Malone is here for his last treatment visit for 38 fractions to his prostate.  He denies pain at this time.  He does urinate one time a night.  He also has urgency with urination which he states is improving.  He is alert and oriented x 3.

## 2012-08-20 NOTE — Progress Notes (Signed)
Weekly Management Note:  Site: Prostate Current Dose:  7600  cGy Projected Dose: 7600  cGy  Narrative: The patient is seen today for routine under treatment assessment. CBCT/MVCT images/port films were reviewed. The chart was reviewed.   Bladder filling has been satisfactory. No GU or GI difficulties except for slight urgency.  Physical Examination:  Filed Vitals:   08/20/12 1135  BP: 135/84  Pulse: 91  Temp: 98.3 F (36.8 C)  .  Weight: 245 lb 14.4 oz (111.54 kg). No change.  Impression: Radiation therapy completed.  Plan: Followup visit in one month.

## 2012-08-21 ENCOUNTER — Ambulatory Visit: Payer: PRIVATE HEALTH INSURANCE

## 2012-09-13 ENCOUNTER — Encounter: Payer: Self-pay | Admitting: Oncology

## 2012-09-17 ENCOUNTER — Ambulatory Visit
Admission: RE | Admit: 2012-09-17 | Discharge: 2012-09-17 | Disposition: A | Discharge status: Home / Self Care | Payer: PRIVATE HEALTH INSURANCE | Source: Ambulatory Visit | Attending: Radiation Oncology | Admitting: Radiation Oncology

## 2012-09-17 VITALS — BP 124/84 | HR 88 | Temp 98.2°F | Ht 64.0 in | Wt 244.3 lb

## 2012-09-17 DIAGNOSIS — C61 Malignant neoplasm of prostate: Secondary | ICD-10-CM

## 2012-09-17 NOTE — Progress Notes (Signed)
Gregory Malone here for 1 month follow appointment.  He received 38 fractions to his prostate.  He denies pain.  He does have fatigue. He denies urinary hesitancy, frequency, hematuria and diarrhea.  He states that he does not have to get up during the night to urinate.

## 2012-09-17 NOTE — Progress Notes (Addendum)
CC: Dr. Barron Alvine  Followup note: Gregory Malone returns today approximately 1 month following completion of external beam/IMRT and management of his stage TI C. favorable risk adenocarcinoma prostate. He is doing well from a GU and GI standpoint. He is back to his baseline. He has not yet have a followup appointment with Dr. Isabel Caprice.  Physical examination: Alert and oriented. Filed Vitals:   09/17/12 1603  BP: 124/84  Pulse: 88  Temp: 98.2 F (36.8 C)   Rectal examination not performed today.  Impression: Satisfactory progress. No sequelae from radiation therapy.  Plan: I told Gregory Malone that Dr. Ellin Goodie office will be calling him for a followup visit in approximately one to 2 months. I've not scheduled Gregory Malone for a formal followup visit and I ask that Dr. Isabel Caprice keep me posted on his progress.

## 2012-09-17 NOTE — Addendum Note (Signed)
Encounter addended by: Maryln Gottron, MD on: 09/17/2012  4:26 PM<BR>     Documentation filed: Visit Diagnoses, Notes Section

## 2013-02-27 ENCOUNTER — Encounter: Payer: Self-pay | Admitting: Family Medicine

## 2013-02-27 ENCOUNTER — Ambulatory Visit (INDEPENDENT_AMBULATORY_CARE_PROVIDER_SITE_OTHER): Payer: PRIVATE HEALTH INSURANCE | Admitting: Family Medicine

## 2013-02-27 VITALS — BP 115/71 | HR 86 | Temp 99.4°F | Ht 63.0 in | Wt 241.8 lb

## 2013-02-27 DIAGNOSIS — E785 Hyperlipidemia, unspecified: Secondary | ICD-10-CM

## 2013-02-27 DIAGNOSIS — I10 Essential (primary) hypertension: Secondary | ICD-10-CM

## 2013-02-27 DIAGNOSIS — Z0000 Encounter for general adult medical examination without abnormal findings: Secondary | ICD-10-CM

## 2013-02-27 LAB — POCT CBC
Granulocyte percent: 83.6 %G — AB (ref 37–80)
HCT, POC: 43.1 % — AB (ref 43.5–53.7)
Hemoglobin: 14.1 g/dL (ref 14.1–18.1)
Lymph, poc: 1.1 (ref 0.6–3.4)
MCH, POC: 29.2 pg (ref 27–31.2)
MCHC: 32.8 g/dL (ref 31.8–35.4)
MCV: 89 fL (ref 80–97)
MPV: 8.2 fL (ref 0–99.8)
POC Granulocyte: 8.3 — AB (ref 2–6.9)
POC LYMPH PERCENT: 11.6 %L (ref 10–50)
Platelet Count, POC: 197 10*3/uL (ref 142–424)
RBC: 4.8 M/uL (ref 4.69–6.13)
RDW, POC: 13.6 %
WBC: 9.9 10*3/uL (ref 4.6–10.2)

## 2013-02-27 MED ORDER — AMLODIPINE BESYLATE 5 MG PO TABS: 5.0000 mg | ORAL_TABLET | Freq: Every day | ORAL | Status: DC

## 2013-02-27 MED ORDER — ATORVASTATIN CALCIUM 40 MG PO TABS: 40.0000 mg | ORAL_TABLET | Freq: Every day | ORAL | Status: DC

## 2013-02-27 MED ORDER — HYDROCHLOROTHIAZIDE 25 MG PO TABS: 25.0000 mg | ORAL_TABLET | Freq: Every day | ORAL | Status: DC

## 2013-02-27 NOTE — Progress Notes (Signed)
  Subjective:    Patient ID: Gregory Malone, male    DOB: 09/13/1950, 62 y.o.   MRN: [id number]  HPI  This 62 y.o. male presents for evaluation of annual physical. He has hx of prostate Cancer and he sees urology.  He has had radiation tx.  He has hx of OA, htn, and hyperlipidemia. He has no acute complaints.  Review of Systems No chest pain, SOB, HA, dizziness, vision change, N/V, diarrhea, constipation, dysuria, urinary urgency or frequency, myalgias, arthralgias or rash.     Objective:   Physical Exam Vital signs noted  Well developed well nourished male.  HEENT - Head atraumatic Normocephalic                Eyes - PERRLA, Conjuctiva - clear Sclera- Clear EOMI                Ears - EAC's Wnl TM's Wnl Gross Hearing WNL                Nose - Nares patent                 Throat - oropharanx wnl Respiratory - Lungs CTA bilateral Cardiac - RRR S1 and S2 without murmur GI - Abdomen soft Nontender and bowel sounds active x 4 Extremities - No edema. Neuro - Grossly intact.       Assessment & Plan:  Other and unspecified hyperlipidemia - Plan: Lipid panel, CMP14+EGFR, atorvastatin (LIPITOR) 40 MG tablet  Essential hypertension, benign - Plan: POCT CBC, CMP14+EGFR, amLODipine (NORVASC) 5 MG tablet, hydrochlorothiazide (HYDRODIURIL) 25 MG tablet  Routine general medical examination at a health care facility - Plan: POCT CBC, Lipid panel, CMP14+EGFR, Thyroid Panel With TSH  Follow up in 6 months

## 2013-02-27 NOTE — Patient Instructions (Signed)

## 2013-02-28 ENCOUNTER — Other Ambulatory Visit: Payer: Self-pay | Admitting: Family Medicine

## 2013-02-28 ENCOUNTER — Telehealth: Payer: Self-pay | Admitting: *Deleted

## 2013-02-28 DIAGNOSIS — E785 Hyperlipidemia, unspecified: Secondary | ICD-10-CM

## 2013-02-28 DIAGNOSIS — E876 Hypokalemia: Secondary | ICD-10-CM

## 2013-02-28 LAB — CMP14+EGFR
ALT: 11 IU/L (ref 0–44)
AST: 13 IU/L (ref 0–40)
Albumin/Globulin Ratio: 1.6 (ref 1.1–2.5)
Albumin: 4.1 g/dL (ref 3.6–4.8)
Alkaline Phosphatase: 79 IU/L (ref 39–117)
BUN/Creatinine Ratio: 16 (ref 10–22)
BUN: 19 mg/dL (ref 8–27)
CO2: 27 mmol/L (ref 18–29)
Calcium: 9.4 mg/dL (ref 8.6–10.2)
Chloride: 99 mmol/L (ref 97–108)
Creatinine, Ser: 1.19 mg/dL (ref 0.76–1.27)
GFR calc Af Amer: 76 mL/min/{1.73_m2} (ref >59)
GFR calc non Af Amer: 66 mL/min/{1.73_m2} (ref >59)
Globulin, Total: 2.5 g/dL (ref 1.5–4.5)
Glucose: 97 mg/dL (ref 65–99)
Potassium: 3.3 mmol/L — ABNORMAL LOW (ref 3.5–5.2)
Sodium: 141 mmol/L (ref 134–144)
Total Bilirubin: 0.4 mg/dL (ref 0.0–1.2)
Total Protein: 6.6 g/dL (ref 6.0–8.5)

## 2013-02-28 LAB — THYROID PANEL WITH TSH
Free Thyroxine Index: 1.7 (ref 1.2–4.9)
T3 Uptake Ratio: 25 % (ref 24–39)
T4, Total: 6.7 ug/dL (ref 4.5–12.0)
TSH: 2.42 u[IU]/mL (ref 0.450–4.500)

## 2013-02-28 LAB — LIPID PANEL
Chol/HDL Ratio: 4.4 ratio units (ref 0.0–5.0)
Cholesterol, Total: 175 mg/dL (ref 100–199)
HDL: 40 mg/dL (ref >39)
LDL Calculated: 115 mg/dL — ABNORMAL HIGH (ref 0–99)
Triglycerides: 99 mg/dL (ref 0–149)
VLDL Cholesterol Cal: 20 mg/dL (ref 5–40)

## 2013-02-28 MED ORDER — POTASSIUM CHLORIDE CRYS ER 20 MEQ PO TBCR: 20.0000 meq | EXTENDED_RELEASE_TABLET | Freq: Every day | ORAL | Status: DC

## 2013-02-28 MED ORDER — ATORVASTATIN CALCIUM 80 MG PO TABS: 80.0000 mg | ORAL_TABLET | Freq: Every day | ORAL | Status: DC

## 2013-02-28 NOTE — Telephone Encounter (Signed)
Pt notified of results Had not been taking Lipitor regularly Wants to try current dose first RX called into [facility] for [facility]dur Pt notified he will continue this med until rck in 3 mths

## 2013-02-28 NOTE — Telephone Encounter (Signed)
Message copied by Bearl Mulberry on Fri Feb 28, 2013  5:38 PM ------      Message from: Deatra Canter      Created: Fri Feb 28, 2013  2:59 PM       Potassium is low and add KCL po qd and called to pharm, Increase lipitor ot 80mg  po qd and rx sent to pharm, Kwas low and chol elevated. ------

## 2014-02-25 ENCOUNTER — Other Ambulatory Visit: Payer: Self-pay | Admitting: Family Medicine

## 2014-02-26 NOTE — Telephone Encounter (Signed)
Patient last seen in office on 02-27-13. Please advise on refill

## 2014-03-03 ENCOUNTER — Other Ambulatory Visit: Payer: Self-pay | Admitting: Family Medicine

## 2014-03-04 ENCOUNTER — Other Ambulatory Visit: Payer: Self-pay | Admitting: Family Medicine

## 2014-04-03 ENCOUNTER — Other Ambulatory Visit: Payer: Self-pay | Admitting: Family Medicine

## 2014-04-03 ENCOUNTER — Ambulatory Visit (INDEPENDENT_AMBULATORY_CARE_PROVIDER_SITE_OTHER): Payer: BC Managed Care – PPO | Admitting: Family Medicine

## 2014-04-03 VITALS — BP 146/92 | HR 87 | Temp 98.1°F | Ht 63.0 in | Wt 254.0 lb

## 2014-04-03 DIAGNOSIS — E876 Hypokalemia: Secondary | ICD-10-CM

## 2014-04-03 DIAGNOSIS — R5383 Other fatigue: Secondary | ICD-10-CM

## 2014-04-03 DIAGNOSIS — I10 Essential (primary) hypertension: Secondary | ICD-10-CM

## 2014-04-03 DIAGNOSIS — E785 Hyperlipidemia, unspecified: Secondary | ICD-10-CM

## 2014-04-03 LAB — POCT CBC
Granulocyte percent: 75.1 %G (ref 37–80)
HCT, POC: 45.1 % (ref 43.5–53.7)
Hemoglobin: 14.6 g/dL (ref 14.1–18.1)
Lymph, poc: 1.5 (ref 0.6–3.4)
MCH, POC: 29.1 pg (ref 27–31.2)
MCHC: 32.3 g/dL (ref 31.8–35.4)
MCV: 90.2 fL (ref 80–97)
MPV: 9.3 fL (ref 0–99.8)
POC Granulocyte: 5.9 (ref 2–6.9)
POC LYMPH PERCENT: 19.6 %L (ref 10–50)
Platelet Count, POC: 198 10*3/uL (ref 142–424)
RBC: 5 M/uL (ref 4.69–6.13)
RDW, POC: 14.2 %
WBC: 7.9 10*3/uL (ref 4.6–10.2)

## 2014-04-03 MED ORDER — HYDROCHLOROTHIAZIDE 25 MG PO TABS: 25.0000 mg | ORAL_TABLET | Freq: Every day | ORAL | Status: DC

## 2014-04-03 MED ORDER — POTASSIUM CHLORIDE CRYS ER 20 MEQ PO TBCR
20.0000 meq | EXTENDED_RELEASE_TABLET | Freq: Once | ORAL | Status: DC
Start: 2014-04-03 — End: 2015-04-10

## 2014-04-03 MED ORDER — AMLODIPINE BESYLATE 10 MG PO TABS: 10.0000 mg | ORAL_TABLET | Freq: Every day | ORAL | Status: DC

## 2014-04-03 MED ORDER — ATORVASTATIN CALCIUM 80 MG PO TABS: 80.0000 mg | ORAL_TABLET | Freq: Every day | ORAL | Status: DC

## 2014-04-03 NOTE — Progress Notes (Signed)
   Subjective:    Patient ID: Gregory Malone, male    DOB: 02/16/1951, 63 y.o.   MRN: [id number]  HPI This 63 y.o. male presents for evaluation of CPE.  He has hx of hypertension,hyperlipidemia, And OA.  He sees Urology for hx of prostate cancer.   Review of Systems No chest pain, SOB, HA, dizziness, vision change, N/V, diarrhea, constipation, dysuria, urinary urgency or frequency, myalgias, arthralgias or rash.     Objective:   Physical Exam Vital signs noted  Well developed well nourished male.  HEENT - Head atraumatic Normocephalic                Eyes - PERRLA, Conjuctiva - clear Sclera- Clear EOMI                Ears - EAC's Wnl TM's Wnl Gross Hearing WNL                Nose - Nares patent                 Throat - oropharanx wnl Respiratory - Lungs CTA bilateral Cardiac - RRR S1 and S2 without murmur GI - Abdomen soft Nontender and bowel sounds active x 4 Extremities - No edema. Neuro - Grossly intact.       Assessment & Plan:  Essential hypertension, benign - Plan: CMP14+EGFR, potassium chloride SA (KLOR-CON M20) 20 MEQ tablet, hydrochlorothiazide (HYDRODIURIL) 25 MG tablet, amLODipine (NORVASC) 10 MG tablet  Other fatigue - Plan: POCT CBC, Thyroid Panel With TSH  Hypokalemia - Plan: CMP14+EGFR  Hyperlipemia - Plan: Lipid panel  Hyperlipidemia - Plan: atorvastatin (LIPITOR) 80 MG tablet  Lysbeth Penner FNP

## 2014-04-04 LAB — CMP14+EGFR
ALT: 21 IU/L (ref 0–44)
AST: 21 IU/L (ref 0–40)
Albumin/Globulin Ratio: 1.8 (ref 1.1–2.5)
Albumin: 4.4 g/dL (ref 3.6–4.8)
Alkaline Phosphatase: 97 IU/L (ref 39–117)
BUN/Creatinine Ratio: 13 (ref 10–22)
BUN: 17 mg/dL (ref 8–27)
CO2: 28 mmol/L (ref 18–29)
Calcium: 9.4 mg/dL (ref 8.6–10.2)
Chloride: 98 mmol/L (ref 97–108)
Creatinine, Ser: 1.26 mg/dL (ref 0.76–1.27)
GFR calc Af Amer: 71 mL/min/{1.73_m2} (ref >59)
GFR calc non Af Amer: 61 mL/min/{1.73_m2} (ref >59)
Globulin, Total: 2.4 g/dL (ref 1.5–4.5)
Glucose: 99 mg/dL (ref 65–99)
Potassium: 3.9 mmol/L (ref 3.5–5.2)
Sodium: 141 mmol/L (ref 134–144)
Total Bilirubin: 0.3 mg/dL (ref 0.0–1.2)
Total Protein: 6.8 g/dL (ref 6.0–8.5)

## 2014-04-04 LAB — THYROID PANEL WITH TSH
Free Thyroxine Index: 1.5 (ref 1.2–4.9)
T3 Uptake Ratio: 23 % — ABNORMAL LOW (ref 24–39)
T4, Total: 6.5 ug/dL (ref 4.5–12.0)
TSH: 2.7 u[IU]/mL (ref 0.450–4.500)

## 2014-04-04 LAB — LIPID PANEL
Chol/HDL Ratio: 3.4 ratio units (ref 0.0–5.0)
Cholesterol, Total: 122 mg/dL (ref 100–199)
HDL: 36 mg/dL — ABNORMAL LOW (ref >39)
LDL Calculated: 63 mg/dL (ref 0–99)
Triglycerides: 116 mg/dL (ref 0–149)
VLDL Cholesterol Cal: 23 mg/dL (ref 5–40)

## 2014-10-13 ENCOUNTER — Telehealth: Payer: Self-pay | Admitting: Family Medicine

## 2014-10-13 DIAGNOSIS — C61 Malignant neoplasm of prostate: Secondary | ICD-10-CM

## 2014-10-13 NOTE — Telephone Encounter (Signed)
It is okay to do referral based on the previous problem he had.

## 2014-10-13 NOTE — Telephone Encounter (Signed)
Patient needs a updated referral for the urologist Dr. Risa Grill his appointment is 12/03/14 and 6/23. Is it ok to send in new referral? Patient was last seen in 03/2014

## 2014-10-13 NOTE — Telephone Encounter (Signed)
Patient aware referral has been sent over.

## 2015-04-02 ENCOUNTER — Ambulatory Visit (INDEPENDENT_AMBULATORY_CARE_PROVIDER_SITE_OTHER): Payer: 59 | Admitting: Pediatrics

## 2015-04-02 ENCOUNTER — Encounter: Payer: Self-pay | Admitting: Pediatrics

## 2015-04-02 ENCOUNTER — Other Ambulatory Visit: Payer: Self-pay | Admitting: Family Medicine

## 2015-04-02 VITALS — BP 139/94 | HR 97 | Temp 97.8°F | Ht 63.0 in | Wt 250.8 lb

## 2015-04-02 DIAGNOSIS — I10 Essential (primary) hypertension: Secondary | ICD-10-CM | POA: Diagnosis not present

## 2015-04-02 DIAGNOSIS — E785 Hyperlipidemia, unspecified: Secondary | ICD-10-CM | POA: Diagnosis not present

## 2015-04-02 MED ORDER — HYDROCHLOROTHIAZIDE 25 MG PO TABS: 25.0000 mg | ORAL_TABLET | Freq: Every day | ORAL | Status: DC

## 2015-04-02 MED ORDER — AMLODIPINE BESYLATE 10 MG PO TABS: 10.0000 mg | ORAL_TABLET | Freq: Every day | ORAL | Status: DC

## 2015-04-02 NOTE — Progress Notes (Signed)
    Subjective:    Patient ID: Gregory Malone, male    DOB: 06/07/1951, 64 y.o.   MRN: [id number]  CC: med refill  HPI: Gregory Malone is a 64 y.o. male presenting on 04/02/2015 for Medication Refill  Ran out of meds today. Followed by urologist for prostate cancer Otherwise feeling well Hiking regularly No CP or SOB with exertion Used to be scout leader   Relevant past medical, surgical, family and social history reviewed and updated as indicated. Interim medical history since our last visit reviewed. Allergies and medications reviewed and updated.   ROS: Per HPI unless specifically indicated above  Past Medical History Patient Active Problem List   Diagnosis Date Noted  . Prostate cancer [facility])     Current Outpatient Prescriptions  Medication Sig Dispense Refill  . amLODipine (NORVASC) 10 MG tablet Take 1 tablet (10 mg total) by mouth daily. 90 tablet 3  . atorvastatin (LIPITOR) 80 MG tablet Take 1 tablet (80 mg total) by mouth daily. 90 tablet 3  . hydrochlorothiazide (HYDRODIURIL) 25 MG tablet Take 1 tablet (25 mg total) by mouth daily. 90 tablet 3  . ibuprofen (ADVIL,MOTRIN) 200 MG tablet Take 200 mg by mouth every 6 (six) hours as needed.    . potassium chloride SA (KLOR-CON M20) 20 MEQ tablet Take 1 tablet (20 mEq total) by mouth once. 90 tablet 3   No current facility-administered medications for this visit.       Objective:    BP 139/94 mmHg  Pulse 97  Temp(Src) 97.8 F (36.6 C) (Oral)  Ht _0  (1.6 m)  Wt 250 lb 12.8 oz (113.762 kg)  BMI 44.44 kg/m2  Wt Readings from Last 3 Encounters:  04/02/15 250 lb 12.8 oz (113.762 kg)  04/03/14 254 lb (115.214 kg)  02/27/13 241 lb 12.8 oz (109.68 kg)     Gen: NAD, alert, cooperative with exam, NCAT EYES: EOMI, no scleral injection or icterus ENT:  TMs pearly gray b/l, OP without erythema LYMPH: no cervical LAD CV: NRRR, normal S1/S2, no murmur, distal pulses 2+ b/l Resp: CTABL, no wheezes, normal  WOB Abd: +BS, soft, NTND. no guarding or organomegaly Ext: No edema, warm Neuro: Alert and oriented     Assessment & Plan:    Evyn was seen today for medication refill.  Diagnoses and all orders for this visit:  Essential hypertension, benign Elevated today. Will RTC for BP recheck in 2 weeks. Discussed small amount weight loss can help with BP control, continue reg exercise, increase fruits/vegetables. -     amLODipine (NORVASC) 10 MG tablet; Take 1 tablet (10 mg total) by mouth daily. -     BMP8+EGFR -     hydrochlorothiazide (HYDRODIURIL) 25 MG tablet; Take 1 tablet (25 mg total) by mouth daily.  Hyperlipidemia Continue atorvastatin   Follow up plan: Return in about 2 weeks (around 04/16/2015) for bp check with Dr. Evette Doffing.  [name], MD [facility] 04/02/2015, 4:58 PM

## 2015-04-03 LAB — BMP8+EGFR
BUN/Creatinine Ratio: 19 (ref 10–22)
BUN: 23 mg/dL (ref 8–27)
CALCIUM: 9.7 mg/dL (ref 8.6–10.2)
CO2: 29 mmol/L (ref 18–29)
CREATININE: 1.23 mg/dL (ref 0.76–1.27)
Chloride: 95 mmol/L — ABNORMAL LOW (ref 97–108)
GFR calc Af Amer: 72 mL/min/{1.73_m2} (ref >59)
GFR calc non Af Amer: 63 mL/min/{1.73_m2} (ref >59)
GLUCOSE: 94 mg/dL (ref 65–99)
Potassium: 4 mmol/L (ref 3.5–5.2)
Sodium: 139 mmol/L (ref 134–144)

## 2015-04-10 ENCOUNTER — Other Ambulatory Visit: Payer: Self-pay | Admitting: Family Medicine

## 2015-04-12 ENCOUNTER — Telehealth: Payer: Self-pay | Admitting: Pediatrics

## 2015-04-12 ENCOUNTER — Other Ambulatory Visit: Payer: Self-pay | Admitting: Pediatrics

## 2015-04-12 DIAGNOSIS — E876 Hypokalemia: Secondary | ICD-10-CM

## 2015-04-12 MED ORDER — POTASSIUM CHLORIDE CRYS ER 20 MEQ PO TBCR: 20.0000 meq | EXTENDED_RELEASE_TABLET | Freq: Every day | ORAL | Status: DC

## 2015-04-12 NOTE — Telephone Encounter (Signed)
Pt notified of results Verbalizes understanding 

## 2015-04-12 NOTE — Telephone Encounter (Signed)
Please review and advise.

## 2015-04-12 NOTE — Telephone Encounter (Signed)
Please review results and advise.

## 2015-04-12 NOTE — Telephone Encounter (Signed)
His labs were normal, there is a results note in now, thank you!

## 2015-04-12 NOTE — Telephone Encounter (Signed)
Done earlier this am

## 2015-04-19 ENCOUNTER — Ambulatory Visit: Payer: 59 | Admitting: Pediatrics

## 2015-04-21 ENCOUNTER — Ambulatory Visit (INDEPENDENT_AMBULATORY_CARE_PROVIDER_SITE_OTHER): Payer: 59 | Admitting: Pediatrics

## 2015-04-21 ENCOUNTER — Encounter: Payer: Self-pay | Admitting: Pediatrics

## 2015-04-21 VITALS — BP 126/78 | HR 80 | Temp 97.7°F | Ht 63.0 in | Wt 249.8 lb

## 2015-04-21 DIAGNOSIS — I10 Essential (primary) hypertension: Secondary | ICD-10-CM | POA: Diagnosis not present

## 2015-04-21 DIAGNOSIS — Z6841 Body Mass Index (BMI) 40.0 and over, adult: Secondary | ICD-10-CM | POA: Diagnosis not present

## 2015-04-21 DIAGNOSIS — E785 Hyperlipidemia, unspecified: Secondary | ICD-10-CM | POA: Diagnosis not present

## 2015-04-21 NOTE — Progress Notes (Signed)
    Subjective:    Patient ID: Gregory Malone, male    DOB: [birth year], 64 y.o.   MRN: [id number]  CC: f/u BP  HPI: Gregory Malone is a 64 y.o. male presenting on 04/21/2015 for Follow-up  On occasion will feel light headed when he stands up, less than once a month Last check was 140/80 at [place], was a while ago Continues to take amlodipine, HCTZ daily. F/u urologist appt in next few months Dad with prostate cancer No headaches, dizziness Smoked 1ppd for 10 yrs, quit 1994   Relevant past medical, surgical, family and social history reviewed and updated as indicated. Interim medical history since our last visit reviewed. Allergies and medications reviewed and updated.   ROS: Per HPI unless specifically indicated above  Past Medical History Patient Active Problem List   Diagnosis Date Noted  . Essential hypertension 04/21/2015  . Hyperlipidemia 04/21/2015  . Prostate cancer [facility])     Current Outpatient Prescriptions  Medication Sig Dispense Refill  . amLODipine (NORVASC) 10 MG tablet Take 1 tablet (10 mg total) by mouth daily. 90 tablet 3  . atorvastatin (LIPITOR) 80 MG tablet Take 1 tablet (80 mg total) by mouth daily. 90 tablet 3  . hydrochlorothiazide (HYDRODIURIL) 25 MG tablet Take 1 tablet (25 mg total) by mouth daily. 90 tablet 3  . ibuprofen (ADVIL,MOTRIN) 200 MG tablet Take 200 mg by mouth every 6 (six) hours as needed.    . potassium chloride SA (KLOR-CON M20) 20 MEQ tablet Take 1 tablet (20 mEq total) by mouth daily. 90 tablet 1   No current facility-administered medications for this visit.       Objective:    BP 126/78 mmHg  Pulse 80  Temp(Src) 97.7 F (36.5 C) (Oral)  Ht 5\' 3"  (1.6 m)  Wt 249 lb 12.8 oz (113.309 kg)  BMI 44.26 kg/m2  Wt Readings from Last 3 Encounters:  04/21/15 249 lb 12.8 oz (113.309 kg)  04/02/15 250 lb 12.8 oz (113.762 kg)  04/03/14 254 lb (115.214 kg)    Gen: NAD, alert, cooperative with exam, NCAT EYES: EOMI, no  scleral injection or icterus LYMPH: no cervical LAD CV: NRRR, normal S1/S2, no murmur, distal pulses 2+ b/l Resp: CTABL, no wheezes, normal WOB Abd: +BS, soft, NTND. no guarding or organomegaly Ext: No edema, warm Neuro: Alert and oriented, strength equal b/l UE and LE, coordination grossly normal MSK: normal muscle bulk     Assessment & Plan:   Cougar was seen today for follow-up of multiple medical problems.  Diagnoses and all orders for this visit:  Essential hypertension Improved control today. Continue current medicines.  Hyperlipidemia Continue atorvastatin.   BMI 40.0-44.9, adult ([place]) Discussed healthy eating choices, increasing physical activity, only going through buffet lines once, even if meat and vegetables should still only have one plate at a meal.  Follow up plan: Return in about 6 months (around 10/19/2015).  [name], MD [facility] 04/21/2015, 11:56 AM

## 2015-04-21 NOTE — Patient Instructions (Signed)
Avoid carbohydrates such as bread Keep hiking regularly One plate with each meal, three meals a day. Minimize snacking

## 2015-05-31 ENCOUNTER — Other Ambulatory Visit: Payer: Self-pay | Admitting: Family Medicine

## 2015-07-13 ENCOUNTER — Other Ambulatory Visit: Payer: BLUE CROSS/BLUE SHIELD

## 2015-07-13 DIAGNOSIS — C61 Malignant neoplasm of prostate: Secondary | ICD-10-CM

## 2015-07-13 NOTE — Progress Notes (Signed)
Labs for dr. Risa Grill

## 2015-07-14 LAB — PSA, TOTAL AND FREE
PSA FREE PCT: 15 %
PSA FREE: 0.06 ng/mL
Prostate Specific Ag, Serum: 0.4 ng/mL (ref 0.0–4.0)

## 2015-10-11 ENCOUNTER — Other Ambulatory Visit: Payer: Self-pay | Admitting: Pediatrics

## 2015-10-12 NOTE — Telephone Encounter (Signed)
Last refill without being seen 

## 2015-12-22 ENCOUNTER — Ambulatory Visit (INDEPENDENT_AMBULATORY_CARE_PROVIDER_SITE_OTHER): Payer: BLUE CROSS/BLUE SHIELD | Admitting: Pediatrics

## 2015-12-22 ENCOUNTER — Encounter: Payer: Self-pay | Admitting: Pediatrics

## 2015-12-22 VITALS — BP 141/87 | HR 73 | Temp 97.2°F | Ht 63.0 in | Wt 251.8 lb

## 2015-12-22 DIAGNOSIS — I10 Essential (primary) hypertension: Secondary | ICD-10-CM

## 2015-12-22 DIAGNOSIS — Z6841 Body Mass Index (BMI) 40.0 and over, adult: Secondary | ICD-10-CM

## 2015-12-22 DIAGNOSIS — C61 Malignant neoplasm of prostate: Secondary | ICD-10-CM

## 2015-12-22 DIAGNOSIS — E785 Hyperlipidemia, unspecified: Secondary | ICD-10-CM

## 2015-12-22 DIAGNOSIS — Z1159 Encounter for screening for other viral diseases: Secondary | ICD-10-CM | POA: Diagnosis not present

## 2015-12-22 MED ORDER — LOSARTAN POTASSIUM 50 MG PO TABS: 50.0000 mg | ORAL_TABLET | Freq: Every day | ORAL | Status: DC

## 2015-12-22 NOTE — Progress Notes (Signed)
    Subjective:    Patient ID: Gregory Malone, male    DOB: 1950/11/07, 65 y.o.   MRN: [id number]  CC: Follow-up multiple med problems.  HPI: Gregory Malone is a 65 y.o. male presenting for Follow-up  Elevated BMI: Started new diet Avoiding simple carbs Eating whole grains Drinking unsweet tea Still hiking some, mowing yards  HTN: no headaches, dizziness BPs at home around 140s/80s  Hep C screen due  Snores some Feels rested when he wakes up Does not fall asleep during the day   Depression screen [facility] 2/9 12/22/2015 04/21/2015 04/02/2015  Decreased Interest 0 0 0  Down, Depressed, Hopeless 0 0 0  PHQ - 2 Score 0 0 0     Relevant past medical, surgical, family and social history reviewed and updated as indicated.  Interim medical history since our last visit reviewed. Allergies and medications reviewed and updated.  ROS: Per HPI unless specifically indicated above  History  Smoking status  . Former Smoker -- 1.00 packs/day for 10 years  . Types: Cigarettes  Smokeless tobacco  . Never Used       Objective:    BP 141/87 mmHg  Pulse 73  Temp(Src) 97.2 F (36.2 C) (Oral)  Ht 5\' 3"  (1.6 m)  Wt 251 lb 12.8 oz (114.216 kg)  BMI 44.62 kg/m2  Wt Readings from Last 3 Encounters:  12/22/15 251 lb 12.8 oz (114.216 kg)  04/21/15 249 lb 12.8 oz (113.309 kg)  04/02/15 250 lb 12.8 oz (113.762 kg)     Gen: NAD, alert, cooperative with exam, NCAT EYES: EOMI, no scleral injection or icterus CV: NRRR, normal S1/S2, no murmur, distal pulses 2+ b/l Resp: CTABL, no wheezes, normal WOB Ext: No edema, warm Neuro: Alert and oriented     Assessment & Plan:    Gregory Malone was seen today for follow-up multiple med problems.  Diagnoses and all orders for this visit:  Essential hypertension Continue amlodipine, HCTZ, cont weight loss. If BPs remain elevated next visit or if increase will start ACE-i -     Basic Metabolic Panel  Hyperlipidemia Cont atorvastatin,  improved control  BMI 40.0-44.9, adult (HCC) Cont lifestyle changes, nutrition changes, decrease sugar -     Hemoglobin A1c  Prostate cancer ([place]) Followed by urology, most recent numbers good S/p radiation.  Need for hepatitis C screening test -     Hepatitis C antibody   Follow up plan: Return in about 4 months (around 04/23/2016) for med follow up.  [name], MD [facility] 12/22/2015, 12:12 PM

## 2015-12-23 LAB — BASIC METABOLIC PANEL
BUN / CREAT RATIO: 18 (ref 10–24)
BUN: 20 mg/dL (ref 8–27)
CHLORIDE: 99 mmol/L (ref 96–106)
CO2: 26 mmol/L (ref 18–29)
CREATININE: 1.1 mg/dL (ref 0.76–1.27)
Calcium: 9.7 mg/dL (ref 8.6–10.2)
GFR calc Af Amer: 82 mL/min/{1.73_m2} (ref >59)
GFR calc non Af Amer: 71 mL/min/{1.73_m2} (ref >59)
GLUCOSE: 101 mg/dL — AB (ref 65–99)
Potassium: 4.1 mmol/L (ref 3.5–5.2)
SODIUM: 143 mmol/L (ref 134–144)

## 2015-12-23 LAB — HEMOGLOBIN A1C
ESTIMATED AVERAGE GLUCOSE: 123 mg/dL
HEMOGLOBIN A1C: 5.9 % — AB (ref 4.8–5.6)

## 2015-12-23 LAB — HEPATITIS C ANTIBODY: Hep C Virus Ab: <0.1 s/co ratio (ref 0.0–0.9)

## 2015-12-27 ENCOUNTER — Encounter: Payer: Self-pay | Admitting: Pediatrics

## 2015-12-27 DIAGNOSIS — E119 Type 2 diabetes mellitus without complications: Secondary | ICD-10-CM | POA: Insufficient documentation

## 2015-12-27 DIAGNOSIS — R7303 Prediabetes: Secondary | ICD-10-CM | POA: Insufficient documentation

## 2015-12-27 DIAGNOSIS — E669 Obesity, unspecified: Secondary | ICD-10-CM | POA: Insufficient documentation

## 2015-12-27 DIAGNOSIS — E1169 Type 2 diabetes mellitus with other specified complication: Secondary | ICD-10-CM | POA: Insufficient documentation

## 2015-12-27 HISTORY — DX: Type 2 diabetes mellitus without complications: E11.9

## 2016-01-13 ENCOUNTER — Other Ambulatory Visit: Payer: Self-pay | Admitting: Nurse Practitioner

## 2016-01-25 ENCOUNTER — Other Ambulatory Visit: Payer: Self-pay | Admitting: Pediatrics

## 2016-03-22 ENCOUNTER — Other Ambulatory Visit: Payer: Self-pay | Admitting: Pediatrics

## 2016-03-22 DIAGNOSIS — I10 Essential (primary) hypertension: Secondary | ICD-10-CM

## 2016-04-14 ENCOUNTER — Other Ambulatory Visit: Payer: Self-pay | Admitting: Pediatrics

## 2016-04-26 ENCOUNTER — Encounter: Payer: Self-pay | Admitting: Pediatrics

## 2016-04-26 ENCOUNTER — Ambulatory Visit (INDEPENDENT_AMBULATORY_CARE_PROVIDER_SITE_OTHER): Payer: BLUE CROSS/BLUE SHIELD | Admitting: Pediatrics

## 2016-04-26 VITALS — BP 159/93 | HR 80 | Temp 97.5°F | Ht 63.0 in | Wt 245.0 lb

## 2016-04-26 DIAGNOSIS — I10 Essential (primary) hypertension: Secondary | ICD-10-CM

## 2016-04-26 DIAGNOSIS — R7303 Prediabetes: Secondary | ICD-10-CM

## 2016-04-26 DIAGNOSIS — E785 Hyperlipidemia, unspecified: Secondary | ICD-10-CM | POA: Diagnosis not present

## 2016-04-26 DIAGNOSIS — Z6841 Body Mass Index (BMI) 40.0 and over, adult: Secondary | ICD-10-CM | POA: Diagnosis not present

## 2016-04-26 LAB — BAYER DCA HB A1C WAIVED: HB A1C: 5.4 % (ref <7.0)

## 2016-04-26 MED ORDER — LISINOPRIL-HYDROCHLOROTHIAZIDE 20-25 MG PO TABS: 1.0000 | ORAL_TABLET | Freq: Every day | ORAL | 2 refills | Status: DC

## 2016-04-26 MED ORDER — ATORVASTATIN CALCIUM 80 MG PO TABS: 80.0000 mg | ORAL_TABLET | Freq: Every day | ORAL | 1 refills | Status: DC

## 2016-04-26 NOTE — Progress Notes (Signed)
  Subjective:   Patient ID: Gregory Malone, male    DOB: [birth year], 65 y.o.   MRN: [id number] CC: Follow-up (4 month)  HPI: Gregory Malone is a 65 y.o. male presenting for Follow-up (4 month)  BMI elevated: Decreased sugar level Doesn't eat sweets at all, cut out pies and ice cream Walking some regulalry, enjoys hiking Checking sugar grams regularly, carbs under 50 a day  HTN: Says BP varies at home Taking meds daily No CP, no SOB  Prostate cancer: follows with urology  HLD: Takes lipitor regularly  Relevant past medical, surgical, family and social history reviewed. Allergies and medications reviewed and updated. History  Smoking Status  . Former Smoker  . Packs/day: 1.00  . Years: 10.00  . Types: Cigarettes  Smokeless Tobacco  . Never Used   ROS: Per HPI   Objective:    BP (!) 159/93   Pulse 80   Temp 97.5 F (36.4 C) (Oral)   Ht '5\' 3"'$  (1.6 m)   Wt 245 lb (111.1 kg)   BMI 43.40 kg/m   Wt Readings from Last 3 Encounters:  04/26/16 245 lb (111.1 kg)  12/22/15 251 lb 12.8 oz (114.2 kg)  04/21/15 249 lb 12.8 oz (113.3 kg)    Gen: NAD, alert, cooperative with exam, NCAT EYES: EOMI, no conjunctival injection, or no icterus ENT:  TMs pearly gray b/l, OP without erythema LYMPH: no cervical LAD CV: NRRR, normal S1/S2, no murmur, distal pulses 2+ b/l Resp: CTABL, no wheezes, normal WOB Abd: +BS, soft, NTND. no guarding or organomegaly Ext: No edema, warm Neuro: Alert and oriented, strength equal b/l UE and LE, coordination grossly normal MSK: normal muscle bulk  Assessment & Plan:  [name] was seen today for follow-up multiple med problems  Diagnoses and all orders for this visit:  Essential hypertension BP elevated Add lisinopril Repeat BMP 2 weeks Check BP at home, let me know if regularly elevated -     BMP8+EGFR -     lisinopril-hydrochlorothiazide (PRINZIDE,ZESTORETIC) 20-25 MG tablet; Take 1 tablet by mouth daily.  Hyperlipidemia,  unspecified hyperlipidemia type Cont lipitor -     atorvastatin (LIPITOR) 80 MG tablet; Take 1 tablet (80 mg total) by mouth daily.  BMI 40.0-44.9, adult (HCC) Weight down 10 lbs Cont lifestyle changes, diet, exercise, avoiding sugar  Pre-diabetes Down to 5.4 A1c, was 5.9 Cont lifestyle changes -     Bayer DCA Hb A1c Waived   Follow up plan: Return in about 4 months (around 08/24/2016). [name], MD [place]

## 2016-04-27 LAB — BMP8+EGFR
BUN/Creatinine Ratio: 17 (ref 10–24)
BUN: 18 mg/dL (ref 8–27)
CO2: 29 mmol/L (ref 18–29)
CREATININE: 1.08 mg/dL (ref 0.76–1.27)
Calcium: 9.6 mg/dL (ref 8.6–10.2)
Chloride: 99 mmol/L (ref 96–106)
GFR calc Af Amer: 84 mL/min/{1.73_m2} (ref >59)
GFR calc non Af Amer: 73 mL/min/{1.73_m2} (ref >59)
GLUCOSE: 96 mg/dL (ref 65–99)
POTASSIUM: 4.2 mmol/L (ref 3.5–5.2)
SODIUM: 144 mmol/L (ref 134–144)

## 2016-06-01 ENCOUNTER — Other Ambulatory Visit: Payer: Self-pay | Admitting: *Deleted

## 2016-06-01 DIAGNOSIS — I10 Essential (primary) hypertension: Secondary | ICD-10-CM

## 2016-06-01 MED ORDER — LISINOPRIL-HYDROCHLOROTHIAZIDE 20-25 MG PO TABS: 1.0000 | ORAL_TABLET | Freq: Every day | ORAL | 1 refills | Status: DC

## 2016-06-19 ENCOUNTER — Other Ambulatory Visit: Payer: Self-pay | Admitting: Pediatrics

## 2016-06-19 DIAGNOSIS — I10 Essential (primary) hypertension: Secondary | ICD-10-CM

## 2016-07-12 ENCOUNTER — Other Ambulatory Visit: Payer: Self-pay | Admitting: Pediatrics

## 2016-08-09 ENCOUNTER — Encounter: Payer: Self-pay | Admitting: Pediatrics

## 2016-08-09 ENCOUNTER — Telehealth: Payer: Self-pay | Admitting: Pediatrics

## 2016-08-09 ENCOUNTER — Ambulatory Visit (INDEPENDENT_AMBULATORY_CARE_PROVIDER_SITE_OTHER): Payer: BLUE CROSS/BLUE SHIELD | Admitting: Pediatrics

## 2016-08-09 VITALS — BP 123/84 | HR 90 | Temp 98.3°F | Ht 63.0 in | Wt 239.0 lb

## 2016-08-09 DIAGNOSIS — Z8546 Personal history of malignant neoplasm of prostate: Secondary | ICD-10-CM

## 2016-08-09 DIAGNOSIS — I10 Essential (primary) hypertension: Secondary | ICD-10-CM

## 2016-08-09 DIAGNOSIS — K219 Gastro-esophageal reflux disease without esophagitis: Secondary | ICD-10-CM | POA: Diagnosis not present

## 2016-08-09 DIAGNOSIS — Z6841 Body Mass Index (BMI) 40.0 and over, adult: Secondary | ICD-10-CM

## 2016-08-09 MED ORDER — ESOMEPRAZOLE MAGNESIUM 40 MG PO CPDR: 40.0000 mg | DELAYED_RELEASE_CAPSULE | Freq: Every day | ORAL | 3 refills | Status: DC

## 2016-08-09 NOTE — Telephone Encounter (Signed)
What symptoms do you have? Coughing up blood   How long have you been sick? 2-3 day  Have you been seen for this problem? no  If your provider decides to give you a prescription, which pharmacy would you like for it to be sent to? [facility]   Patient informed that this information will be sent to the clinical staff for review and that they should receive a follow up call.

## 2016-08-09 NOTE — Progress Notes (Signed)
  Subjective:   Patient ID: Gregory Malone, male    DOB: 1951/01/31, 66 y.o.   MRN: [id number] CC: coughing up small amount of blood in mornings (? acid reflux) and darker stools  HPI: Gregory Malone is a 66 y.o. male presenting for coughing up small amount of blood in mornings (? acid reflux) and darker stools  Two days ago in the morning coughed up small "scabby bits" of reddish or black material Coughing in the morning past two days, not every morning This is unusual No bright red blood Tiny amount of light pink material that he had to force out of coughing this morning by coughing really hard No SOB, no trouble breathing, no coughing other than in the morning No chest pain No pain with deep breaths  Continues to have some acid reflux Drinking a lot of tomato juice every day Drinks 1-2 beers at a time, none the last few days  Stopped taking ibuprofen No chest pain, SOB, trouble breathing  No abd pain Takes tums every day for reflux, peptobismal regularly but not every day Stools have been slightly darker he thinks  Relevant past medical, surgical, family and social history reviewed. Allergies and medications reviewed and updated. History  Smoking Status  . Former Smoker  . Packs/day: 1.00  . Years: 10.00  . Types: Cigarettes  Smokeless Tobacco  . Never Used   ROS: Per HPI   Objective:    BP 123/84   Pulse 90   Temp 98.3 F (36.8 C) (Oral)   Ht 5\' 3"  (1.6 m)   Wt 239 lb (108.4 kg)   BMI 42.34 kg/m   Wt Readings from Last 3 Encounters:  08/09/16 239 lb (108.4 kg)  04/26/16 245 lb (111.1 kg)  12/22/15 251 lb 12.8 oz (114.2 kg)    Gen: NAD, alert, cooperative with exam, NCAT EYES: EOMI, no conjunctival injection, or no icterus ENT:   OP without erythema LYMPH: no cervical LAD CV: NRRR, normal S1/S2, no murmur, distal pulses 2+ b/l Resp: CTABL, no wheezes, normal WOB Abd: +BS, soft, NTND.  Ext: No edema, warm, calves equal Neuro: Alert and  oriented  Assessment & Plan:  Gregory Malone was seen today for coughing up small amount of blood in mornings and darker stools.  Diagnoses and all orders for this visit:  Gastroesophageal reflux disease, esophagitis presence not specified Worsening symptoms of reflux Tends to have worse symptoms in the morning Not sure if coughing vs regurgitating reddish flecks in mucus No bright red blood, only flecks, minimal amount of material O2 sat 97%, no resp symptoms, normal lung exam Will treat for GER, discussed foods to avoid Let me know if any worsening symptoms -     esomeprazole (NEXIUM) 40 MG capsule; Take 1 capsule (40 mg total) by mouth daily.  H/O prostate cancer Due for repeat PSA per urology -     PSA, total and free -     CBC with Differential/Platelet  Essential hypertension Adequate control, cont current medicines  BMI 40.0-44.9, adult ([place]) Has been working hard to lose weight Cont lifestyle changes  Follow up plan: 3 mo [name], MD [place]

## 2016-08-09 NOTE — Telephone Encounter (Signed)
No answer, no voicemail jkp 2/21

## 2016-08-09 NOTE — Telephone Encounter (Signed)
Patient has been coughing up some blood, has noticed that his stool seems to be a little doctor.  Is concerned and would like to be evaluated.  Appointment today at 3:30 with Dr. Evette Doffing.

## 2016-08-10 LAB — CBC WITH DIFFERENTIAL/PLATELET
BASOS ABS: 0 10*3/uL (ref 0.0–0.2)
Basos: 0 %
EOS (ABSOLUTE): 0.1 10*3/uL (ref 0.0–0.4)
Eos: 1 %
HEMOGLOBIN: 15.8 g/dL (ref 13.0–17.7)
Hematocrit: 46.3 % (ref 37.5–51.0)
IMMATURE GRANS (ABS): 0 10*3/uL (ref 0.0–0.1)
IMMATURE GRANULOCYTES: 0 %
LYMPHS: 18 %
Lymphocytes Absolute: 1.7 10*3/uL (ref 0.7–3.1)
MCH: 31.3 pg (ref 26.6–33.0)
MCHC: 34.1 g/dL (ref 31.5–35.7)
MCV: 92 fL (ref 79–97)
MONOCYTES: 8 %
Monocytes Absolute: 0.7 10*3/uL (ref 0.1–0.9)
NEUTROS PCT: 73 %
Neutrophils Absolute: 6.8 10*3/uL (ref 1.4–7.0)
Platelets: 231 10*3/uL (ref 150–379)
RBC: 5.05 x10E6/uL (ref 4.14–5.80)
RDW: 14.5 % (ref 12.3–15.4)
WBC: 9.3 10*3/uL (ref 3.4–10.8)

## 2016-08-10 LAB — PSA, TOTAL AND FREE
PSA FREE PCT: 10
PSA, Free: 0.05 ng/mL
Prostate Specific Ag, Serum: 0.5 ng/mL (ref 0.0–4.0)

## 2016-08-11 ENCOUNTER — Telehealth: Payer: Self-pay | Admitting: Pediatrics

## 2016-08-11 NOTE — Telephone Encounter (Signed)
Patient wants to make you aware

## 2016-08-23 ENCOUNTER — Telehealth: Payer: Self-pay | Admitting: Pediatrics

## 2016-08-23 DIAGNOSIS — R040 Epistaxis: Secondary | ICD-10-CM

## 2016-08-23 NOTE — Progress Notes (Signed)
I will put in referral. He does not need to come in tomorrow, can f/u in 3 mo

## 2016-08-23 NOTE — Telephone Encounter (Signed)
Frequent nosebleeds

## 2016-08-24 ENCOUNTER — Ambulatory Visit: Payer: BLUE CROSS/BLUE SHIELD | Admitting: Pediatrics

## 2016-10-03 DIAGNOSIS — R042 Hemoptysis: Secondary | ICD-10-CM | POA: Insufficient documentation

## 2016-10-07 ENCOUNTER — Other Ambulatory Visit: Payer: Self-pay | Admitting: Pediatrics

## 2016-10-07 DIAGNOSIS — I10 Essential (primary) hypertension: Secondary | ICD-10-CM

## 2016-10-09 ENCOUNTER — Telehealth: Payer: Self-pay | Admitting: Pediatrics

## 2016-10-09 DIAGNOSIS — R042 Hemoptysis: Secondary | ICD-10-CM

## 2016-10-09 NOTE — Telephone Encounter (Signed)
ENT saw small spots in sinus but no real cause for the epitaxis he has been having.  ENT suggests seeing a pulmonologist since he is still having this issue.  He is still taking his reflux meds with no current issues besides the soreness he has reported around the middle of the chest occasionally as if he has coughed to hard.

## 2016-10-09 NOTE — Addendum Note (Signed)
Addended by: Eustaquio Maize on: 10/09/2016 02:06 PM   Modules accepted: Orders

## 2016-10-09 NOTE — Telephone Encounter (Signed)
Taking nexium daily Continues to have pink frothy sputum every morning A few weeks ago thought was decreasing Saw by ENT, no cause identified Will refer to pulm

## 2016-10-17 ENCOUNTER — Ambulatory Visit (INDEPENDENT_AMBULATORY_CARE_PROVIDER_SITE_OTHER): Payer: BLUE CROSS/BLUE SHIELD | Admitting: Pulmonary Disease

## 2016-10-17 ENCOUNTER — Encounter: Payer: Self-pay | Admitting: Pulmonary Disease

## 2016-10-17 DIAGNOSIS — R042 Hemoptysis: Secondary | ICD-10-CM

## 2016-10-17 NOTE — Patient Instructions (Addendum)
We'll schedule for a CT of the chest If this is normal then we will need to refer you to gastroenterology for evaluation of acid reflux, esophagitis  Return to clinic in 3 months

## 2016-10-17 NOTE — Progress Notes (Signed)
Gregory Malone    [id number]    1950-09-09  Primary Care Physician:Carol Esther Hardy, MD  Referring Physician: Eustaquio Maize, MD [place], [place] [id number]  Chief complaint:  Consult for evaluation of bloody sputum  HPI: 66 year old with history of GERD. He has complains of spitting up blood for the past 3 months. He reports that he brings up blood when he wakes up in the morning. The blood is described as small scabby pieces of reddish material. He does not have issues for the rest of the day usually. However when he lies down and takes a nap for several hours the symptoms recur. He has some soreness at the center of the chest. No cough, palpitations, dyspnea, wheezing, fevers production, fevers, chills.  He had an ENT examination by Dr. Redmond Baseman in April 2018 which did not show any acute abnormality. A GI consult was suggested to evaluate acid reflux but this has not been done yet. He's had a colonoscopy in 2008 which showed diverticula, colon polyps. There is no record of EGD. He has been started on Nexium for the past 2 months by his primary care and this has not improved his symptoms.   Occupation: Worked in the [employer]. Currently retired Exposures: No significant exposures at work or at home Smoking history: 10 pack year smoker and quit in [place] Prescriptions as of 10/17/2016  Medication Sig  . amLODipine (NORVASC) 10 MG tablet TAKE 1 TABLET BY MOUTH EVERY DAY  . atorvastatin (LIPITOR) 80 MG tablet Take 1 tablet (80 mg total) by mouth daily.  Marland Kitchen esomeprazole (NEXIUM) 40 MG capsule Take 1 capsule (40 mg total) by mouth daily.  Marland Kitchen ibuprofen (ADVIL,MOTRIN) 200 MG tablet Take 200 mg by mouth every 6 (six) hours as needed.  Marland Kitchen KLOR-CON M20 20 MEQ tablet TAKE 1 TABLET EVERY DAY  . lisinopril-hydrochlorothiazide (PRINZIDE,ZESTORETIC) 20-25 MG tablet TAKE 1 TABLET BY MOUTH DAILY.  Marland Kitchen ibuprofen (ADVIL,MOTRIN) 200 MG tablet  Take by mouth.   No facility-administered encounter medications on file as of 10/17/2016.     Allergies as of 10/17/2016  . (No Known Allergies)    Past Medical History:  Diagnosis Date  . Asthma   . Chronic kidney disease    nephrolithiasis  couple episodes 10 years ago  . Hypercholesterolemia   . Hypertension   . Prostate cancer ([place]) 03/26/12 bx   ,gleason=3+3=6,PSA=4.22,volume=34cc  . Radiation 06/27/2012-08/20/2012   prostate 7600 cGy 38 sessions    Past Surgical History:  Procedure Laterality Date  . PROSTATE BIOPSY  03/26/12   adenocarcinoma  . TONSILLECTOMY      Family History  Problem Relation Age of Onset  . Cancer Father 85    prostate   . Cancer Maternal Grandfather 50    prostate ca  . Cancer Paternal Uncle     prostate ca  . Cancer Cousin     prostate ca    Social History   Social History  . Marital status: Divorced    Spouse name: N/A  . Number of children: 0  . Years of education: N/A   Occupational History  .      Retired Media planner   Social History Main Topics  . Smoking status: Former Smoker    Packs/day: 1.00    Years: 10.00    Types: Cigarettes  . Smokeless tobacco: Never Used     Comment:  quit smoking in 1994  . Alcohol use Yes     Comment: 1  beer  occasionally social  . Drug use: No     Comment: quit smoking 1994  . Sexual activity: Yes   Other Topics Concern  . Not on file   Social History Narrative  . No narrative on file    Review of systems: Review of Systems  Constitutional: Negative for fever and chills.  HENT: Negative.   Eyes: Negative for blurred vision.  Respiratory: as per HPI  Cardiovascular: Negative for chest pain and palpitations.  Gastrointestinal: Negative for vomiting, diarrhea, blood per rectum. Genitourinary: Negative for dysuria, urgency, frequency and hematuria.  Musculoskeletal: Negative for myalgias, back pain and joint pain.  Skin: Negative for itching and rash.  Neurological: Negative  for dizziness, tremors, focal weakness, seizures and loss of consciousness.  Endo/Heme/Allergies: Negative for environmental allergies.  Psychiatric/Behavioral: Negative for depression, suicidal ideas and hallucinations.  All other systems reviewed and are negative.  Physical Exam: There were no vitals taken for this visit. Gen:      No acute distress HEENT:  EOMI, sclera anicteric Neck:     No masses; no thyromegaly Lungs:    Clear to auscultation bilaterally; normal respiratory effort CV:         Regular rate and rhythm; no murmurs Abd:      + bowel sounds; soft, non-tender; no palpable masses, no distension Ext:    No edema; adequate peripheral perfusion Skin:      Warm and dry; no rash Neuro: alert and oriented x 3 Psych: normal mood and affect  Data Reviewed:   Assessment:  Eval for atypical chest pain, blood in the sputum The symptoms may be related to his acid reflux and possibly esophagitis. This is supported by the pattern of having the symptoms when lying down and bringing up blood in the morning. Though he has a remote smoking history suspicion for malignancy is low. As he has atypical chest pain we will evaluate by getting a CT of the chest. If this is normal then I will refer him to GI for evaluation of acid reflux.  I don't feel he'll need a bronchoscopy until the above evaluation is complete. However if his symptoms persist then we will schedule bronch for airway inspection.  Plan/Recommendations: - CT chest  Marshell Garfinkel MD [facility] Pager [phone] 10/17/2016, 4:07 PM  CC: Eustaquio Maize, MD

## 2016-10-18 ENCOUNTER — Telehealth: Payer: Self-pay | Admitting: Pulmonary Disease

## 2016-10-18 NOTE — Telephone Encounter (Signed)
Pt is having CT chest tomorrow, and order is not signed by PM.   PM please sign order ASAP.  Thanks!

## 2016-10-19 ENCOUNTER — Telehealth: Payer: Self-pay | Admitting: Pediatrics

## 2016-10-19 ENCOUNTER — Other Ambulatory Visit: Payer: Self-pay

## 2016-10-19 ENCOUNTER — Encounter: Payer: Self-pay | Admitting: Gastroenterology

## 2016-10-19 ENCOUNTER — Ambulatory Visit (HOSPITAL_COMMUNITY)
Admission: RE | Admit: 2016-10-19 | Discharge: 2016-10-19 | Disposition: A | Discharge status: Home / Self Care | Payer: BLUE CROSS/BLUE SHIELD | Source: Ambulatory Visit | Attending: Pulmonary Disease | Admitting: Pulmonary Disease

## 2016-10-19 DIAGNOSIS — I700 Atherosclerosis of aorta: Secondary | ICD-10-CM | POA: Diagnosis not present

## 2016-10-19 DIAGNOSIS — I2510 Atherosclerotic heart disease of native coronary artery without angina pectoris: Secondary | ICD-10-CM | POA: Insufficient documentation

## 2016-10-19 DIAGNOSIS — K219 Gastro-esophageal reflux disease without esophagitis: Secondary | ICD-10-CM

## 2016-10-19 DIAGNOSIS — R918 Other nonspecific abnormal finding of lung field: Secondary | ICD-10-CM | POA: Insufficient documentation

## 2016-10-19 DIAGNOSIS — R042 Hemoptysis: Secondary | ICD-10-CM | POA: Diagnosis present

## 2016-10-19 NOTE — Telephone Encounter (Signed)
Patient has a CT chest done this morning and would like Dr. Evette Doffing to call him about results.

## 2016-10-19 NOTE — Telephone Encounter (Signed)
He'll should contact the pulmonologist bc that is who ordered the test

## 2016-10-19 NOTE — Telephone Encounter (Signed)
Patient aware and verbalizes understanding. 

## 2016-10-20 ENCOUNTER — Telehealth: Payer: Self-pay | Admitting: Pulmonary Disease

## 2016-10-20 NOTE — Telephone Encounter (Signed)
PM  Please Advise-  Pt called and stated he talked with Dr. Autumn Patty office and they received the CT scan. Pt states when he spoke with nurse he did not know if he needed to see them urgently due to the coronary artery calcifications. Please advise if pt needs to see them urgently or have them make the decision.    Notes recorded by Marshell Garfinkel, MD on 10/19/2016 at 12:15 PM EDT Please let the patient know that the CT small lung nodules that are likely benign. There is no explanation in the lung for the blood in sputum. Refer him to GI for evaluation of GERD  He has coronary artery calcifications that may indicate heart disease. Ask him to follow with his primary care physician and send a report of the CT scan. Thanks

## 2016-10-20 NOTE — Telephone Encounter (Signed)
Attempted to call the pt but the VM has not been set up yet.

## 2016-10-20 NOTE — Telephone Encounter (Signed)
It is non urgent.  PM

## 2016-10-20 NOTE — Telephone Encounter (Signed)
Patient returned phone call, patient states does not have voicemail.Marland Kitchen Marland KitchenMearl Malone

## 2016-10-23 NOTE — Telephone Encounter (Signed)
Patient returning call - he can be reached at [phone] -pr

## 2016-10-23 NOTE — Telephone Encounter (Signed)
Pt called back, aware of PM's recs.   Nothing further needed.

## 2016-10-23 NOTE — Telephone Encounter (Signed)
atc pt back, no answer, no vm.  wcb.

## 2016-10-23 NOTE — Telephone Encounter (Signed)
Pt returning call.Gregory Malone ° °

## 2016-10-23 NOTE — Telephone Encounter (Signed)
Attempted to call the pt but the VM is not set up.

## 2016-10-26 ENCOUNTER — Ambulatory Visit (INDEPENDENT_AMBULATORY_CARE_PROVIDER_SITE_OTHER): Payer: BLUE CROSS/BLUE SHIELD | Admitting: Gastroenterology

## 2016-10-26 ENCOUNTER — Encounter: Payer: Self-pay | Admitting: Gastroenterology

## 2016-10-26 VITALS — BP 138/72 | HR 88 | Ht 63.0 in | Wt 246.0 lb

## 2016-10-26 DIAGNOSIS — R0789 Other chest pain: Secondary | ICD-10-CM | POA: Diagnosis not present

## 2016-10-26 DIAGNOSIS — K920 Hematemesis: Secondary | ICD-10-CM | POA: Insufficient documentation

## 2016-10-26 DIAGNOSIS — Z1211 Encounter for screening for malignant neoplasm of colon: Secondary | ICD-10-CM | POA: Diagnosis not present

## 2016-10-26 DIAGNOSIS — K219 Gastro-esophageal reflux disease without esophagitis: Secondary | ICD-10-CM

## 2016-10-26 MED ORDER — NA SULFATE-K SULFATE-MG SULF 17.5-3.13-1.6 GM/177ML PO SOLN: 1.0000 | ORAL | 0 refills | Status: DC

## 2016-10-26 NOTE — Progress Notes (Signed)
Thank you for sending this case to me. I have reviewed the entire note, and the outlined plan seems appropriate.  I agree that is sounds unlikely to be from the UGI tract.  However, its duration and extensive evaluation thus far warrants an answer one way or the other.  Wilfrid Lund, MD

## 2016-10-26 NOTE — Progress Notes (Signed)
10/26/2016 DEAUNTE DENTE [id number] 1951/01/08   HISTORY OF PRESENT ILLNESS:  This is a 66 year old male who is new to our office. He is here at the request of Dr. Vaughan Browner of Pulmonary to discuss spitting up blood.  He states that the end of February he began having this issue. He says that when he wakes up in the morning he spits out pink material with what he describes as small scabby pieces of reddish material or flakes of blood. He did see ENT and had evaluation by them, which was unremarkable. Then he saw pulmonary for hemoptysis and underwent chest CT, but no source of this was identified. He was then referred here to determine if this could be GI in origin.  He has had some issues with reflux over the years. Was actually placed on Nexium about 8 weeks ago, which he thinks has helped with his reflux symptoms. He also describes a discomfort in the center of his chest like a soreness. He is unsure if it is musculoskeletal or related to some possible esophagitis or whatever else may be going on for from a GI standpoint that is causing this bleeding issue.  He does admit to taking ibuprofen daily for the past 5 years or more for arthritic type pains. Denies black stools or red blood in his stools.  He tells me that he did have a colonoscopy at [name] about 9 or 10 years ago. He thinks he is found have some polyps but they were benign and he was told to have a repeat colonoscopy in 10 years. We do not have the records of that study.  Past Medical History:  Diagnosis Date  . Asthma   . Bowel obstruction ([place])   . CAD (coronary artery disease)   . Chronic kidney disease    nephrolithiasis  couple episodes 10 years ago  . GERD (gastroesophageal reflux disease)   . Hx of colonic polyp   . Hypercholesterolemia   . Hypertension   . Prostate cancer ([place]) 03/26/12 bx   ,gleason=3+3=6,PSA=4.22,volume=34cc  . Pulmonary nodule   . Radiation 06/27/2012-08/20/2012   prostate 7600  cGy 38 sessions   Past Surgical History:  Procedure Laterality Date  . PROSTATE BIOPSY  03/26/12   adenocarcinoma  . TONSILLECTOMY      reports that he quit smoking about 24 years ago. His smoking use included Cigarettes. He has a 10.00 pack-year smoking history. He has never used smokeless tobacco. He reports that he drinks alcohol. He reports that he does not use drugs. family history includes Colon cancer in his cousin and maternal aunt; Diabetes in his sister; Prostate cancer in his cousin and paternal uncle; Prostate cancer (age of onset: 30) in his maternal grandfather; Prostate cancer (age of onset: 69) in his father; Uterine cancer in his sister. No Known Allergies    Outpatient Encounter Prescriptions as of 10/26/2016  Medication Sig  . amLODipine (NORVASC) 10 MG tablet TAKE 1 TABLET BY MOUTH EVERY DAY  . atorvastatin (LIPITOR) 80 MG tablet Take 1 tablet (80 mg total) by mouth daily.  Marland Kitchen esomeprazole (NEXIUM) 40 MG capsule Take 1 capsule (40 mg total) by mouth daily.  Marland Kitchen ibuprofen (ADVIL,MOTRIN) 200 MG tablet Take 200 mg by mouth every 6 (six) hours as needed.  Marland Kitchen ibuprofen (ADVIL,MOTRIN) 200 MG tablet Take by mouth.  Marland Kitchen KLOR-CON M20 20 MEQ tablet TAKE 1 TABLET EVERY DAY  . lisinopril-hydrochlorothiazide (PRINZIDE,ZESTORETIC) 20-25 MG tablet TAKE 1 TABLET BY  MOUTH DAILY.   No facility-administered encounter medications on file as of 10/26/2016.     REVIEW OF SYSTEMS  : All other systems reviewed and negative except where noted in the History of Present Illness.   PHYSICAL EXAM: BP 138/72 (BP Location: Left Arm, Patient Position: Sitting, Cuff Size: Large)   Pulse 88   Ht 5\' 3"  (1.6 m) Comment: height measured without shoes  Wt 246 lb (111.6 kg)   BMI 43.58 kg/m  General: Well developed white male in no acute distress Head: Normocephalic and atraumatic Eyes:  Sclerae anicteric, conjunctiva pink. Ears: Normal auditory acuity Lungs: Clear throughout to auscultation; no  increased WOB Heart: Regular rate and rhythm Abdomen: Soft, obese, non-distended.  Normal bowel sounds. Non-tender. Rectal:  Will be done at the time of colonoscopy. Musculoskeletal: Symmetrical with no gross deformities  Skin: No lesions on visible extremities Extremities: No edema  Neurological: Alert oriented x 4, grossly non-focal Psychological:  Alert and cooperative. Normal mood and affect  ASSESSMENT AND PLAN: -Hematemesis, atypical chest pain, chronic GERD:  This does not sound like hematemesis.  Describes "spitting blood".  ENT and pulmonary evals negative and referred here.  ? Esophagitis with nocturnal reflux causing this.  ? If chest pain is musculoskeletal.  Will schedule for EGD with Dr. [name].  Continue Nexium 40 mg daily for now. -Screening colonoscopy:  Will schedule with Dr. [name] as well.  *The risks, benefits, and alternatives to EGD and colonoscopy were discussed with the patient and he consents to proceed.   CC:  Gregory Maize, MD  CC:  Dr. Vaughan Browner CC:  Dr. Redmond Baseman, ENT

## 2016-10-26 NOTE — Patient Instructions (Signed)

## 2016-10-30 ENCOUNTER — Ambulatory Visit (HOSPITAL_COMMUNITY): Payer: BLUE CROSS/BLUE SHIELD

## 2016-11-09 ENCOUNTER — Encounter: Payer: Self-pay | Admitting: Gastroenterology

## 2016-11-20 ENCOUNTER — Telehealth: Payer: Self-pay | Admitting: Gastroenterology

## 2016-11-20 NOTE — Telephone Encounter (Signed)
Called and spoke to the pharmacy. Pt has a pay no more than $50 coupon. Explained the process to the patient.

## 2016-11-23 ENCOUNTER — Ambulatory Visit (AMBULATORY_SURGERY_CENTER): Payer: BLUE CROSS/BLUE SHIELD | Admitting: Gastroenterology

## 2016-11-23 ENCOUNTER — Encounter: Payer: Self-pay | Admitting: Gastroenterology

## 2016-11-23 VITALS — BP 123/76 | HR 80 | Temp 99.1°F | Resp 17 | Ht 63.0 in | Wt 246.0 lb

## 2016-11-23 DIAGNOSIS — D124 Benign neoplasm of descending colon: Secondary | ICD-10-CM | POA: Diagnosis not present

## 2016-11-23 DIAGNOSIS — Z1212 Encounter for screening for malignant neoplasm of rectum: Secondary | ICD-10-CM

## 2016-11-23 DIAGNOSIS — D120 Benign neoplasm of cecum: Secondary | ICD-10-CM | POA: Diagnosis not present

## 2016-11-23 DIAGNOSIS — D122 Benign neoplasm of ascending colon: Secondary | ICD-10-CM

## 2016-11-23 DIAGNOSIS — D123 Benign neoplasm of transverse colon: Secondary | ICD-10-CM | POA: Diagnosis not present

## 2016-11-23 DIAGNOSIS — K635 Polyp of colon: Secondary | ICD-10-CM

## 2016-11-23 DIAGNOSIS — K219 Gastro-esophageal reflux disease without esophagitis: Secondary | ICD-10-CM

## 2016-11-23 DIAGNOSIS — D125 Benign neoplasm of sigmoid colon: Secondary | ICD-10-CM

## 2016-11-23 DIAGNOSIS — Z1211 Encounter for screening for malignant neoplasm of colon: Secondary | ICD-10-CM | POA: Diagnosis not present

## 2016-11-23 MED ORDER — SODIUM CHLORIDE 0.9 % IV SOLN: 500.0000 mL | INTRAVENOUS | Status: DC

## 2016-11-23 NOTE — Patient Instructions (Signed)
Discharge instructions given.  Handout on polyps and diverticulosis. Resume previous medications. No aspirin ,ibuprofen,naproxen,or other non-steroidal anti-inflammatory drugs for 7 days. YOU HAD AN ENDOSCOPIC PROCEDURE TODAY AT [facility]:   Refer to the procedure report that was given to you for any specific questions about what was found during the examination.  If the procedure report does not answer your questions, please call your gastroenterologist to clarify.  If you requested that your care partner not be given the details of your procedure findings, then the procedure report has been included in a sealed envelope for you to review at your convenience later.  YOU SHOULD EXPECT: Some feelings of bloating in the abdomen. Passage of more gas than usual.  Walking can help get rid of the air that was put into your GI tract during the procedure and reduce the bloating. If you had a lower endoscopy (such as a colonoscopy or flexible sigmoidoscopy) you may notice spotting of blood in your stool or on the toilet paper. If you underwent a bowel prep for your procedure, you may not have a normal bowel movement for a few days.  Please Note:  You might notice some irritation and congestion in your nose or some drainage.  This is from the oxygen used during your procedure.  There is no need for concern and it should clear up in a day or so.  SYMPTOMS TO REPORT IMMEDIATELY:   Following lower endoscopy (colonoscopy or flexible sigmoidoscopy):  Excessive amounts of blood in the stool  Significant tenderness or worsening of abdominal pains  Swelling of the abdomen that is new, acute  Fever of 100F or higher   Following upper endoscopy (EGD)  Vomiting of blood or coffee ground material  New chest pain or pain under the shoulder blades  Painful or persistently difficult swallowing  New shortness of breath  Fever of 100F or higher  Black, tarry-looking stools  For urgent or  emergent issues, a gastroenterologist can be reached at any hour by calling [phone].   DIET:  We do recommend a small meal at first, but then you may proceed to your regular diet.  Drink plenty of fluids but you should avoid alcoholic beverages for 24 hours.  ACTIVITY:  You should plan to take it easy for the rest of today and you should NOT DRIVE or use heavy machinery until tomorrow (because of the sedation medicines used during the test).    FOLLOW UP: Our staff will call the number listed on your records the next business day following your procedure to check on you and address any questions or concerns that you may have regarding the information given to you following your procedure. If we do not reach you, we will leave a message.  However, if you are feeling well and you are not experiencing any problems, there is no need to return our call.  We will assume that you have returned to your regular daily activities without incident.  If any biopsies were taken you will be contacted by phone or by letter within the next 1-3 weeks.  Please call us at [phone] if you have not heard about the biopsies in 3 weeks.    SIGNATURES/CONFIDENTIALITY: You and/or your care partner have signed paperwork which will be entered into your electronic medical record.  These signatures attest to the fact that that the information above on your After Visit Summary has been reviewed and is understood.  Full responsibility of the confidentiality  of this discharge information lies with you and/or your care-partner.

## 2016-11-23 NOTE — Progress Notes (Signed)
Pt's states no medical or surgical changes since previsit or office visit. 

## 2016-11-23 NOTE — Progress Notes (Signed)
Called to room to assist during endoscopic procedure.  Patient ID and intended procedure confirmed with present staff. Received instructions for my participation in the procedure from the performing physician.  

## 2016-11-23 NOTE — Progress Notes (Signed)
Report given to PACU, vss 

## 2016-11-23 NOTE — Progress Notes (Signed)
Patient's r hand was swollen. He stated that "it was normal"  It's okay to put IV in it."

## 2016-11-23 NOTE — Op Note (Signed)
[place] Patient Name: Gregory Malone Procedure Date: 11/23/2016 2:02 PM MRN: [id number] Endoscopist: Mallie Mussel L. [name] , MD Age: 66 Referring MD:  Date of Birth: Mar 19, 1951 Gender: Male Account #: [id number] Procedure:                Upper GI endoscopy Indications:              Heartburn, hemoptysis (extensive prior workup -                            question of whether GERD may have caused bleeding) Medicines:                Monitored Anesthesia Care Procedure:                Pre-Anesthesia Assessment:                           - Prior to the procedure, a History and Physical                            was performed, and patient medications and                            allergies were reviewed. The patient's tolerance of                            previous anesthesia was also reviewed. The risks                            and benefits of the procedure and the sedation                            options and risks were discussed with the patient.                            All questions were answered, and informed consent                            was obtained. Prior Anticoagulants: The patient has                            taken no previous anticoagulant or antiplatelet                            agents. ASA Grade Assessment: II - A patient with                            mild systemic disease. After reviewing the risks                            and benefits, the patient was deemed in                            satisfactory condition to undergo the procedure.  After obtaining informed consent, the endoscope was                            passed under direct vision. Throughout the                            procedure, the patient's blood pressure, pulse, and                            oxygen saturations were monitored continuously. The                            Model GIF-HQ190 [id number]) scope was introduced   through the mouth, and advanced to the second part                            of duodenum. The upper GI endoscopy was                            accomplished without difficulty. The patient                            tolerated the procedure well. Scope In: Scope Out: Findings:                 The larynx was normal.                           The esophagus was normal.                           The stomach was normal.                           The cardia and gastric fundus were normal on                            retroflexion.                           The examined duodenum was normal. Complications:            No immediate complications. Estimated Blood Loss:     Estimated blood loss: none. Impression:               - Normal larynx.                           - Normal esophagus.                           - Normal stomach.                           - Normal examined duodenum.                           - No specimens collected.  No source of bleeding seen. Sounds most likely to                            have been from an airway source. Recommendation:           - Patient has a contact number available for                            emergencies. The signs and symptoms of potential                            delayed complications were discussed with the                            patient. Return to normal activities tomorrow.                            Written discharge instructions were provided to the                            patient.                           - Resume previous diet.                           - Continue present medications.                           - See the other procedure note for documentation of                            additional recommendations. Henry L. [name], MD 11/23/2016 2:46:04 PM This report has been signed electronically.

## 2016-11-23 NOTE — Op Note (Signed)
[place] Patient Name: Gregory Malone Procedure Date: 11/23/2016 2:17 PM MRN: [id number] Endoscopist: Mallie Mussel L. [name] , MD Age: 66 Referring MD:  Date of Birth: 12-07-1950 Gender: Male Account #: [id number] Procedure:                Colonoscopy Indications:              Screening for colorectal malignant neoplasm                            (patient reports normal colonoscopy 10 years ago) Medicines:                Monitored Anesthesia Care Procedure:                Pre-Anesthesia Assessment:                           - Prior to the procedure, a History and Physical                            was performed, and patient medications and                            allergies were reviewed. The patient's tolerance of                            previous anesthesia was also reviewed. The risks                            and benefits of the procedure and the sedation                            options and risks were discussed with the patient.                            All questions were answered, and informed consent                            was obtained. Prior Anticoagulants: The patient has                            taken no previous anticoagulant or antiplatelet                            agents. ASA Grade Assessment: II - A patient with                            mild systemic disease. After reviewing the risks                            and benefits, the patient was deemed in                            satisfactory condition to undergo the procedure.                           -  Prior to the procedure, a History and Physical                            was performed, and patient medications and                            allergies were reviewed. The patient's tolerance of                            previous anesthesia was also reviewed. The risks                            and benefits of the procedure and the sedation                            options and risks were  discussed with the patient.                            All questions were answered, and informed consent                            was obtained. Prior Anticoagulants: The patient has                            taken no previous anticoagulant or antiplatelet                            agents. ASA Grade Assessment: II - A patient with                            mild systemic disease. After reviewing the risks                            and benefits, the patient was deemed in                            satisfactory condition to undergo the procedure.                           After obtaining informed consent, the colonoscope                            was passed under direct vision. Throughout the                            procedure, the patient's blood pressure, pulse, and                            oxygen saturations were monitored continuously. The                            Colonoscope was introduced through the anus and  advanced to the the cecum, identified by                            appendiceal orifice and ileocecal valve. The                            colonoscopy was performed without difficulty. The                            patient tolerated the procedure well. The quality                            of the bowel preparation was good. The ileocecal                            valve, appendiceal orifice, and rectum were                            photographed. The quality of the bowel preparation                            was evaluated using the BBPS [facility] Bowel                            Preparation Scale) with scores of: Right Colon = 2,                            Transverse Colon = 2 and Left Colon = 2. The total                            BBPS score equals 6. After lavage. The bowel                            preparation used was SUPREP. Scope In: 2:18:39 PM Scope Out: 2:41:08 PM Scope Withdrawal Time: 0 hours 13 minutes 34 seconds  Total  Procedure Duration: 0 hours 22 minutes 29 seconds  Findings:                 The perianal and digital rectal examinations were                            normal.                           12 sessile polyps were found in the sigmoid colon                            (1), descending colon (2), transverse colon,(5)                            ascending colon (2) and cecum (2). The polyps were                            2 to 6 mm in  size. These polyps were removed with a                            cold snare. Resection and retrieval were complete.                            Six of the polyps were removed during scope                            insertion and the remainder during scope withdrawal.                           Diverticula were found in the entire colon.                           The exam was otherwise without abnormality on                            direct and retroflexion views. Complications:            No immediate complications. Estimated Blood Loss:     Estimated blood loss: none. Impression:               - 12 2 to 6 mm polyps in the sigmoid colon, in the                            descending colon, in the transverse colon, in the                            ascending colon and in the cecum, removed with a                            cold snare. Resected and retrieved.                           - Diverticulosis in the entire examined colon.                           - The examination was otherwise normal on direct                            and retroflexion views. Recommendation:           - Patient has a contact number available for                            emergencies. The signs and symptoms of potential                            delayed complications were discussed with the                            patient. Return to normal activities tomorrow.  Written discharge instructions were provided to the                            patient.                            - Resume previous diet.                           - Continue present medications.                           - No aspirin, ibuprofen, naproxen, or other                            non-steroidal anti-inflammatory drugs for 7 days                            after polyp removal.                           - Await pathology results.                           - Repeat colonoscopy is recommended for                            surveillance. The colonoscopy date will be                            determined after pathology results from today's                            exam become available for review. Kaya Klausing L. [name], MD 11/23/2016 2:50:17 PM This report has been signed electronically.

## 2016-11-24 ENCOUNTER — Telehealth: Payer: Self-pay | Admitting: *Deleted

## 2016-11-24 NOTE — Telephone Encounter (Signed)
  Follow up Call-  Call back number 11/23/2016  Post procedure Call Back phone  # [phone]  Permission to leave phone message No  Some recent data might be hidden     Patient questions:  Do you have a fever, pain , or abdominal swelling? No. Pain Score  0 *  Have you tolerated food without any problems? Yes.    Have you been able to return to your normal activities? Yes.    Do you have any questions about your discharge instructions: Diet   No. Medications  No. Follow up visit  No.  Do you have questions or concerns about your Care? No.  Actions: * If pain score is 4 or above: No action needed, pain <4.

## 2016-11-28 ENCOUNTER — Encounter: Payer: Self-pay | Admitting: Gastroenterology

## 2016-11-29 ENCOUNTER — Other Ambulatory Visit: Payer: Self-pay | Admitting: Pediatrics

## 2016-11-29 DIAGNOSIS — K219 Gastro-esophageal reflux disease without esophagitis: Secondary | ICD-10-CM

## 2016-12-17 ENCOUNTER — Other Ambulatory Visit: Payer: Self-pay | Admitting: Pediatrics

## 2016-12-17 DIAGNOSIS — E785 Hyperlipidemia, unspecified: Secondary | ICD-10-CM

## 2016-12-19 NOTE — Telephone Encounter (Signed)
Last lipid 10/15

## 2016-12-28 ENCOUNTER — Encounter: Payer: Self-pay | Admitting: Pediatrics

## 2016-12-28 ENCOUNTER — Ambulatory Visit (INDEPENDENT_AMBULATORY_CARE_PROVIDER_SITE_OTHER): Payer: BLUE CROSS/BLUE SHIELD | Admitting: Pediatrics

## 2016-12-28 VITALS — BP 129/87 | HR 95 | Temp 99.2°F | Ht 63.0 in | Wt 226.6 lb

## 2016-12-28 DIAGNOSIS — I2510 Atherosclerotic heart disease of native coronary artery without angina pectoris: Secondary | ICD-10-CM | POA: Diagnosis not present

## 2016-12-28 DIAGNOSIS — I10 Essential (primary) hypertension: Secondary | ICD-10-CM

## 2016-12-28 DIAGNOSIS — K5790 Diverticulosis of intestine, part unspecified, without perforation or abscess without bleeding: Secondary | ICD-10-CM

## 2016-12-28 DIAGNOSIS — M7989 Other specified soft tissue disorders: Secondary | ICD-10-CM | POA: Diagnosis not present

## 2016-12-28 MED ORDER — METOPROLOL TARTRATE 25 MG PO TABS: 25.0000 mg | ORAL_TABLET | Freq: Two times a day (BID) | ORAL | 3 refills | Status: DC

## 2016-12-28 MED ORDER — ASPIRIN EC 81 MG PO TBEC: 81.0000 mg | DELAYED_RELEASE_TABLET | Freq: Every day | ORAL | Status: DC

## 2016-12-28 NOTE — Progress Notes (Signed)
  Subjective:   Patient ID: Gregory Malone, male    DOB: [birth year], 66 y.o.   MRN: [id number] CC: Follow-up and Leg cramps  HPI: Gregory Malone is a 66 y.o. male presenting for Follow-up and Leg cramps  Started with leg cramps several months ago in the spring Thinks it was due to low potassium Has been improving past week Was pain to walk on legs, both legs were swollen equally  Changed his diet Started losing weight Has continued to be active, hiking regularly Has not had any chest pain or pressure or SOB with exercise Is pleased with weight loss  Coughing up pink tinged sputum has stopped Had EGD, CT scan of chest with no clear source or cause of blood CT chest showed CAD  Relevant past medical, surgical, family and social history reviewed. Allergies and medications reviewed and updated. History  Smoking Status  . Former Smoker  . Packs/day: 1.00  . Years: 10.00  . Types: Cigarettes  . Quit date: 06/19/1992  Smokeless Tobacco  . Never Used    Comment: quit smoking in 1994   ROS: Per HPI   Objective:    BP 129/87   Pulse 95   Temp 99.2 F (37.3 C) (Oral)   Ht 5' 3" (1.6 m)   Wt 226 lb 9.6 oz (102.8 kg)   BMI 40.14 kg/m   Wt Readings from Last 3 Encounters:  12/28/16 226 lb 9.6 oz (102.8 kg)  11/23/16 246 lb (111.6 kg)  10/26/16 246 lb (111.6 kg)    Gen: NAD, alert, cooperative with exam, NCAT EYES: EOMI, no conjunctival injection, or no icterus ENT: OP without erythema LYMPH: no cervical LAD CV: NRRR, normal S1/S2, no murmur, distal pulses 2+ b/l Resp: CTABL, no wheezes, normal WOB Abd: +BS, soft, NTND. Ext: No edema, warm Neuro: Alert and oriented, strength equal b/l UE and LE, coordination grossly normal MSK: normal muscle bulk  Assessment & Plan:  Gregory Malone was seen today for follow-up and leg cramps.  Diagnoses and all orders for this visit:  Leg swelling Improved, decreased salt in diet -     CMP14+EGFR -     Uric Acid  Coronary artery  disease involving native heart without angina pectoris, unspecified vessel or lesion type No h/o MI, never had symptoms Remains very active, hiking regularly though has had to decrease activity past few weeks with leg pain  On statin, start ASA 81, low dose metoprolol Cont BP meds -     aspirin EC 81 MG tablet; Take 1 tablet (81 mg total) by mouth daily. -     metoprolol tartrate (LOPRESSOR) 25 MG tablet; Take 1 tablet (25 mg total) by mouth 2 (two) times daily.  Diverticulosis of intestine without bleeding, unspecified intestinal tract location No symptoms, seen on colonoscopy  HTN DBP slightly elevated today Cont to check at home Let me know if stays elevated  Follow up plan: Return in about 3 months (around 03/30/2017) for med follow up. [name], MD [place]

## 2016-12-29 LAB — CMP14+EGFR
ALBUMIN: 3.8 g/dL (ref 3.6–4.8)
ALK PHOS: 96 IU/L (ref 39–117)
ALT: 18 IU/L (ref 0–44)
AST: 14 IU/L (ref 0–40)
Albumin/Globulin Ratio: 1.2 (ref 1.2–2.2)
BILIRUBIN TOTAL: 0.3 mg/dL (ref 0.0–1.2)
BUN / CREAT RATIO: 16 (ref 10–24)
BUN: 20 mg/dL (ref 8–27)
CHLORIDE: 100 mmol/L (ref 96–106)
CO2: 28 mmol/L (ref 20–29)
CREATININE: 1.29 mg/dL — AB (ref 0.76–1.27)
Calcium: 9.7 mg/dL (ref 8.6–10.2)
GFR calc Af Amer: 67 mL/min/{1.73_m2} (ref >59)
GFR calc non Af Amer: 58 mL/min/{1.73_m2} — ABNORMAL LOW (ref >59)
GLUCOSE: 87 mg/dL (ref 65–99)
Globulin, Total: 3.2 g/dL (ref 1.5–4.5)
Potassium: 4 mmol/L (ref 3.5–5.2)
Sodium: 143 mmol/L (ref 134–144)
Total Protein: 7 g/dL (ref 6.0–8.5)

## 2016-12-29 LAB — URIC ACID: URIC ACID: 8.2 mg/dL (ref 3.7–8.6)

## 2017-01-02 LAB — LIPID PANEL
CHOLESTEROL TOTAL: 111 mg/dL (ref 100–199)
Chol/HDL Ratio: 4 ratio (ref 0.0–5.0)
HDL: 28 mg/dL — ABNORMAL LOW (ref >39)
LDL Calculated: 60 mg/dL (ref 0–99)
Triglycerides: 114 mg/dL (ref 0–149)
VLDL Cholesterol Cal: 23 mg/dL (ref 5–40)

## 2017-01-02 LAB — SPECIMEN STATUS REPORT

## 2017-01-03 ENCOUNTER — Other Ambulatory Visit: Payer: Self-pay | Admitting: Pediatrics

## 2017-01-03 DIAGNOSIS — K219 Gastro-esophageal reflux disease without esophagitis: Secondary | ICD-10-CM

## 2017-01-04 ENCOUNTER — Other Ambulatory Visit: Payer: Self-pay | Admitting: Pediatrics

## 2017-01-18 ENCOUNTER — Encounter: Payer: Self-pay | Admitting: Pulmonary Disease

## 2017-01-18 ENCOUNTER — Ambulatory Visit (INDEPENDENT_AMBULATORY_CARE_PROVIDER_SITE_OTHER): Payer: BLUE CROSS/BLUE SHIELD | Admitting: Pulmonary Disease

## 2017-01-18 VITALS — BP 134/74 | HR 86 | Ht 63.0 in | Wt 226.4 lb

## 2017-01-18 DIAGNOSIS — R042 Hemoptysis: Secondary | ICD-10-CM | POA: Diagnosis not present

## 2017-01-18 DIAGNOSIS — R918 Other nonspecific abnormal finding of lung field: Secondary | ICD-10-CM

## 2017-01-18 NOTE — Patient Instructions (Signed)
I'm glad that his hemoptysis has resolved Recommend getting a follow-up CT scan in a year's time to keep an eye on the lung nodules Return to clinic in 1 year. Please let us know when he gets on Medicare and would like to get that ordered.

## 2017-01-18 NOTE — Progress Notes (Signed)
Gregory Malone    [id number]    [birth year]  Primary Care Physician:Vincent, [place] Hun, MD  Referring Physician: Eustaquio Maize, MD [place], [place] [address]  Chief complaint:  Follow up for evaluation of bloody sputum  HPI: 66 year old with history of GERD. He has complains of spitting up blood for the past 3 months. He reports that he brings up blood when he wakes up in the morning. The blood is described as small scabby pieces of reddish material. He does not have issues for the rest of the day usually. However when he lies down and takes a nap for several hours the symptoms recur. He has some soreness at the center of the chest. No cough, palpitations, dyspnea, wheezing, fevers production, fevers, chills.  He had an ENT examination by Dr. Redmond Baseman in April 2018 which did not show any acute abnormality. A GI consult was suggested to evaluate acid reflux but this has not been done yet. He's had a colonoscopy in 2008 which showed diverticula, colon polyps. There is no record of EGD. He has been started on Nexium for the past 2 months by his primary care and this has not improved his symptoms.   Occupation: Worked in the [place]. Currently retired Exposures: No significant exposures at work or at home Smoking history: 10 pack year smoker and quit in 1994  Interim History: He has seen GI and had EGD, colonoscopy done by Dr. [name]. The results of these are unremarkable with no signs of bleeding identified. He reports that the hemoptysis has resolved. He does not have any other episodes of chest pain. Denies any cough, sputum production, dyspnea, wheezing.  Outpatient Encounter Prescriptions as of 01/18/2017  Medication Sig  . amLODipine (NORVASC) 10 MG tablet TAKE 1 TABLET BY MOUTH EVERY DAY  . aspirin EC 81 MG tablet Take 1 tablet (81 mg total) by mouth daily.  Marland Kitchen atorvastatin (LIPITOR) 80 MG tablet TAKE 1 TABLET (80 MG TOTAL) BY MOUTH  DAILY.  Marland Kitchen esomeprazole (NEXIUM) 40 MG capsule TAKE 1 CAPSULE (40 MG TOTAL) BY MOUTH DAILY.  Marland Kitchen ibuprofen (ADVIL,MOTRIN) 200 MG tablet Take 200 mg by mouth every 6 (six) hours as needed.  Marland Kitchen KLOR-CON M20 20 MEQ tablet TAKE 1 TABLET EVERY DAY  . lisinopril-hydrochlorothiazide (PRINZIDE,ZESTORETIC) 20-25 MG tablet TAKE 1 TABLET BY MOUTH DAILY.  . metoprolol tartrate (LOPRESSOR) 25 MG tablet Take 1 tablet (25 mg total) by mouth 2 (two) times daily.   Facility-Administered Encounter Medications as of 01/18/2017  Medication  . 0.9 %  sodium chloride infusion    Allergies as of 01/18/2017  . (No Known Allergies)    Past Medical History:  Diagnosis Date  . Asthma   . Bowel obstruction ([place])   . CAD (coronary artery disease)   . Chronic kidney disease    nephrolithiasis  couple episodes 10 years ago  . GERD (gastroesophageal reflux disease)   . Hx of colonic polyp   . Hypercholesterolemia   . Hypertension   . Prostate cancer ([place]) 03/26/12 bx   ,gleason=3+3=6,PSA=4.22,volume=34cc  . Pulmonary nodule   . Radiation 06/27/2012-08/20/2012   prostate 7600 cGy 38 sessions    Past Surgical History:  Procedure Laterality Date  . PROSTATE BIOPSY  03/26/12   adenocarcinoma  . TONSILLECTOMY      Family History  Problem Relation Age of Onset  . Prostate cancer Father 93  . Prostate cancer  Maternal Grandfather 65  . Prostate cancer Paternal Uncle   . Prostate cancer Cousin   . Uterine cancer Sister   . Diabetes Sister   . Colon cancer Maternal Aunt   . Colon cancer Cousin        x 2    Social History   Social History  . Marital status: Divorced    Spouse name: N/A  . Number of children: 0  . Years of education: N/A   Occupational History  . retired/[employer]     Retired Media planner   Social History Main Topics  . Smoking status: Former Smoker    Packs/day: 1.00    Years: 10.00    Types: Cigarettes    Quit date: 06/19/1992  . Smokeless tobacco: Never Used     Comment:  quit smoking in 1994  . Alcohol use Yes     Comment: 1  beer  occasionally social  . Drug use: No  . Sexual activity: Yes   Other Topics Concern  . Not on file   Social History Narrative  . No narrative on file    Review of systems: Review of Systems  Constitutional: Negative for fever and chills.  HENT: Negative.   Eyes: Negative for blurred vision.  Respiratory: as per HPI  Cardiovascular: Negative for chest pain and palpitations.  Gastrointestinal: Negative for vomiting, diarrhea, blood per rectum. Genitourinary: Negative for dysuria, urgency, frequency and hematuria.  Musculoskeletal: Negative for myalgias, back pain and joint pain.  Skin: Negative for itching and rash.  Neurological: Negative for dizziness, tremors, focal weakness, seizures and loss of consciousness.  Endo/Heme/Allergies: Negative for environmental allergies.  Psychiatric/Behavioral: Negative for depression, suicidal ideas and hallucinations.  All other systems reviewed and are negative.  Physical Exam: There were no vitals taken for this visit. Gen:      No acute distress HEENT:  EOMI, sclera anicteric Neck:     No masses; no thyromegaly Lungs:    Clear to auscultation bilaterally; normal respiratory effort CV:         Regular rate and rhythm; no murmurs Abd:      + bowel sounds; soft, non-tender; no palpable masses, no distension Ext:    No edema; adequate peripheral perfusion Skin:      Warm and dry; no rash Neuro: alert and oriented x 3 Psych: normal mood and affect  Data Reviewed: CT chest 10/19/16 - scattered subcentimeter pulmonary nodules, aortic atherosclerosis, coronary calcification I reviewed the images personally.  Assessment:  Eval for atypical chest pain, blood in the sputum He has had an extensive workup including ENT, GI eval. CT scan of chest shows only small pulmonary nodules that are unlikely to be the source of the bleed. His symptoms appear to have resolved and we will hold  off on a bronch for now. He'll call us back if there is any recurrence and we can schedule him for bronch that time.  Sub cm pulm nodules He will need a follow-up CT in 1 year given his smoking history. He like to hold off ordering this until he can get on Medicare next year.  Plan/Recommendations: - CT chest without contrast I 1 year - Consider bronch if symptoms recur.   Marshell Garfinkel MD  Pulmonary and Critical Care Pager [phone] 01/18/2017, 1:41 PM  CC: Eustaquio Maize, MD

## 2017-03-12 ENCOUNTER — Other Ambulatory Visit: Payer: Self-pay | Admitting: Pediatrics

## 2017-03-12 DIAGNOSIS — I10 Essential (primary) hypertension: Secondary | ICD-10-CM

## 2017-03-19 ENCOUNTER — Other Ambulatory Visit: Payer: Self-pay | Admitting: Pediatrics

## 2017-03-19 DIAGNOSIS — I10 Essential (primary) hypertension: Secondary | ICD-10-CM

## 2017-03-20 ENCOUNTER — Other Ambulatory Visit: Payer: Self-pay | Admitting: Pediatrics

## 2017-03-20 DIAGNOSIS — E785 Hyperlipidemia, unspecified: Secondary | ICD-10-CM

## 2017-05-25 ENCOUNTER — Other Ambulatory Visit: Payer: Self-pay | Admitting: *Deleted

## 2017-05-25 NOTE — Patient Outreach (Signed)
[place] [facility]) Care Management  05/25/2017  Gregory Malone [birth year] [id number]   Health Risk Assessment   Unsuccessful telephone contact, phone message no voice mail set, unable to leave a message. Will plan return call in next  Week.  [name], RN, [place] Management Coordinator  [phone]- Mobile [phone]- Toll Free Main Office

## 2017-05-28 ENCOUNTER — Other Ambulatory Visit: Payer: Self-pay | Admitting: *Deleted

## 2017-05-28 ENCOUNTER — Encounter: Payer: Self-pay | Admitting: *Deleted

## 2017-05-28 NOTE — Patient Outreach (Addendum)
[place] [place] [facility]) Care Management  05/28/2017  Gregory Malone March 11, 1951 [id number]  Health Risk Assessment  2nd attempt   Unsuccessful telephone contact, phone message no voice mail set, unable to leave a message. Will plan return call within a week.   19 Addendum Incoming call from patient , explained reason for the call . Patient discussed his medical condition of Hypertension, reports taking medication as prescribed, following up with PCP about every 6 months, and checking his blood pressure at [facility] about weekly. Patient discussed that he  is active daily and tolerating well.  Patient denies any other concerns at this time,he understands how to contact [facility] if needed.   Will send successful outreach letter with [facility] care management contact information.   [name], RN, [place] Management Coordinator  [phone]- Mobile [phone]- Toll Free Main Office

## 2017-06-07 ENCOUNTER — Other Ambulatory Visit: Payer: Self-pay | Admitting: Pediatrics

## 2017-06-07 DIAGNOSIS — I10 Essential (primary) hypertension: Secondary | ICD-10-CM

## 2017-06-09 ENCOUNTER — Other Ambulatory Visit: Payer: Self-pay | Admitting: Pediatrics

## 2017-06-09 DIAGNOSIS — I10 Essential (primary) hypertension: Secondary | ICD-10-CM

## 2017-06-17 ENCOUNTER — Other Ambulatory Visit: Payer: Self-pay | Admitting: Pediatrics

## 2017-06-17 DIAGNOSIS — E785 Hyperlipidemia, unspecified: Secondary | ICD-10-CM

## 2017-06-21 ENCOUNTER — Ambulatory Visit: Payer: PPO | Admitting: Pediatrics

## 2017-06-27 ENCOUNTER — Ambulatory Visit (INDEPENDENT_AMBULATORY_CARE_PROVIDER_SITE_OTHER): Payer: PPO | Admitting: Pediatrics

## 2017-06-27 ENCOUNTER — Encounter: Payer: Self-pay | Admitting: Pediatrics

## 2017-06-27 VITALS — BP 149/81 | HR 75 | Temp 97.8°F | Ht 63.0 in | Wt 260.2 lb

## 2017-06-27 DIAGNOSIS — R7303 Prediabetes: Secondary | ICD-10-CM | POA: Diagnosis not present

## 2017-06-27 DIAGNOSIS — E785 Hyperlipidemia, unspecified: Secondary | ICD-10-CM

## 2017-06-27 DIAGNOSIS — I10 Essential (primary) hypertension: Secondary | ICD-10-CM

## 2017-06-27 DIAGNOSIS — Z6841 Body Mass Index (BMI) 40.0 and over, adult: Secondary | ICD-10-CM

## 2017-06-27 DIAGNOSIS — I2510 Atherosclerotic heart disease of native coronary artery without angina pectoris: Secondary | ICD-10-CM

## 2017-06-27 DIAGNOSIS — Z8546 Personal history of malignant neoplasm of prostate: Secondary | ICD-10-CM | POA: Diagnosis not present

## 2017-06-27 DIAGNOSIS — K219 Gastro-esophageal reflux disease without esophagitis: Secondary | ICD-10-CM

## 2017-06-27 MED ORDER — ATORVASTATIN CALCIUM 80 MG PO TABS: 80.0000 mg | ORAL_TABLET | Freq: Every day | ORAL | 1 refills | Status: DC

## 2017-06-27 MED ORDER — METOPROLOL TARTRATE 25 MG PO TABS: 25.0000 mg | ORAL_TABLET | Freq: Two times a day (BID) | ORAL | 1 refills | Status: DC

## 2017-06-27 MED ORDER — ESOMEPRAZOLE MAGNESIUM 40 MG PO CPDR: 40.0000 mg | DELAYED_RELEASE_CAPSULE | Freq: Every day | ORAL | 1 refills | Status: DC

## 2017-06-27 MED ORDER — LOSARTAN POTASSIUM-HCTZ 100-25 MG PO TABS
1.0000 | ORAL_TABLET | Freq: Every day | ORAL | 3 refills | Status: DC
Start: 2017-06-27 — End: 2018-04-22

## 2017-06-27 MED ORDER — AMLODIPINE BESYLATE 10 MG PO TABS: 10.0000 mg | ORAL_TABLET | Freq: Every day | ORAL | 1 refills | Status: DC

## 2017-06-27 NOTE — Progress Notes (Signed)
Subjective:   Patient ID: Gregory Malone, male    DOB: 02-19-1951, 68 y.o.   MRN: [id number] CC: Follow-up multiple med problems HPI: Gregory Malone is a 67 y.o. male presenting for Follow-up  No leg swelling recently  Says he needs his PSA checked per Dr Risa Grill S/p prostate cancer irradiation treatments  GERD: taking nexium regularly, has symptoms when he skips it  Weight gain: up 35 lbs since last visit Says he always gains weight in the winter, less active, eating meals with family regularly  HTN: no SOB, no CP Still hiking regularly without limitations going up and hills Taking his medicines regularly.  Relevant past medical, surgical, family and social history reviewed. Allergies and medications reviewed and updated. Social History   Tobacco Use  Smoking Status Former Smoker  . Packs/day: 1.00  . Years: 10.00  . Pack years: 10.00  . Types: Cigarettes  . Last attempt to quit: 06/19/1992  . Years since quitting: 25.0  Smokeless Tobacco Never Used  Tobacco Comment   quit smoking in 1994   ROS: Per HPI   Objective:    BP (!) 149/81   Pulse 75   Temp 97.8 F (36.6 C) (Oral)   Ht '5\' 3"'$  (1.6 m)   Wt 260 lb 3.2 oz (118 kg)   BMI 46.09 kg/m   Wt Readings from Last 3 Encounters:  06/27/17 260 lb 3.2 oz (118 kg)  01/18/17 226 lb 6.4 oz (102.7 kg)  12/28/16 226 lb 9.6 oz (102.8 kg)    Gen: NAD, alert, cooperative with exam, NCAT EYES: EOMI, no conjunctival injection, or no icterus ENT:  TMs pearly gray b/l, OP without erythema LYMPH: no cervical LAD CV: NRRR, normal S1/S2, no murmur, distal pulses 2+ b/l Resp: CTABL, no wheezes, normal WOB Abd: +BS, soft, NTND Ext: No edema, warm Neuro: Alert and oriented, strength equal b/l UE and LE, coordination grossly normal MSK: normal muscle bulk  Assessment & Plan:  Gregory Malone was seen today for follow-up multiple medical problems  Diagnoses and all orders for this visit:  Essential hypertension,  benign Elevated today, will switch from medium dose lisinopril, with HCTZ to losartan HCTZ Patient to check blood pressures at home, let me know if regularly over 140 or over 90 -     amLODipine (NORVASC) 10 MG tablet; Take 1 tablet (10 mg total) by mouth daily. -     CMP14+EGFR -     losartan-hydrochlorothiazide (HYZAAR) 100-25 MG tablet; Take 1 tablet by mouth daily.  Hyperlipidemia, unspecified hyperlipidemia type Stable, continue below -     atorvastatin (LIPITOR) 80 MG tablet; Take 1 tablet (80 mg total) by mouth daily.  Gastroesophageal reflux disease, esophagitis presence not specified Well-controlled as long as he takes below, continue -     esomeprazole (NEXIUM) 40 MG capsule; Take 1 capsule (40 mg total) by mouth daily.  Coronary artery disease involving native heart without angina pectoris, unspecified vessel or lesion type -     metoprolol tartrate (LOPRESSOR) 25 MG tablet; Take 1 tablet (25 mg total) by mouth 2 (two) times daily.  H/O prostate cancer -     PSA, total and free  Pre-diabetes A1c of 5.8, lifestyle changes as below -     Bayer DCA Hb A1c Waived  Obesity, BMI 46 35 pound weight gain since last visit.  Discussed lifestyle changes, eating smaller portions, eating lots of fruits and vegetables, avoiding sugary drinks, continuing regular exercise.  Follow up plan: Return in about  3 months (around 09/25/2017). [name], MD [place]

## 2017-06-28 LAB — CMP14+EGFR
ALBUMIN: 4.2 g/dL (ref 3.6–4.8)
ALT: 20 IU/L (ref 0–44)
AST: 17 IU/L (ref 0–40)
Albumin/Globulin Ratio: 1.5 (ref 1.2–2.2)
Alkaline Phosphatase: 116 IU/L (ref 39–117)
BUN/Creatinine Ratio: 16 (ref 10–24)
BUN: 20 mg/dL (ref 8–27)
Bilirubin Total: 0.4 mg/dL (ref 0.0–1.2)
CALCIUM: 9.1 mg/dL (ref 8.6–10.2)
CO2: 28 mmol/L (ref 20–29)
CREATININE: 1.25 mg/dL (ref 0.76–1.27)
Chloride: 102 mmol/L (ref 96–106)
GFR calc Af Amer: 69 mL/min/{1.73_m2} (ref >59)
GFR, EST NON AFRICAN AMERICAN: 60 mL/min/{1.73_m2} (ref >59)
GLOBULIN, TOTAL: 2.8 g/dL (ref 1.5–4.5)
Glucose: 106 mg/dL — ABNORMAL HIGH (ref 65–99)
Potassium: 4 mmol/L (ref 3.5–5.2)
SODIUM: 144 mmol/L (ref 134–144)
TOTAL PROTEIN: 7 g/dL (ref 6.0–8.5)

## 2017-06-28 LAB — PSA, TOTAL AND FREE
PSA FREE: 0.06 ng/mL
PSA, Free Pct: 15 %
Prostate Specific Ag, Serum: 0.4 ng/mL (ref 0.0–4.0)

## 2017-06-29 ENCOUNTER — Other Ambulatory Visit: Payer: Self-pay | Admitting: *Deleted

## 2017-06-29 DIAGNOSIS — R7303 Prediabetes: Secondary | ICD-10-CM

## 2017-06-29 LAB — BAYER DCA HB A1C WAIVED: HB A1C: 5.8 % (ref <7.0)

## 2017-07-10 ENCOUNTER — Other Ambulatory Visit: Payer: Self-pay | Admitting: Pediatrics

## 2017-09-26 ENCOUNTER — Encounter: Payer: Self-pay | Admitting: Pediatrics

## 2017-09-26 ENCOUNTER — Ambulatory Visit (INDEPENDENT_AMBULATORY_CARE_PROVIDER_SITE_OTHER): Payer: PPO | Admitting: Pediatrics

## 2017-09-26 VITALS — BP 136/86 | HR 74 | Temp 97.4°F | Ht 63.0 in | Wt 264.8 lb

## 2017-09-26 DIAGNOSIS — Z6841 Body Mass Index (BMI) 40.0 and over, adult: Secondary | ICD-10-CM | POA: Diagnosis not present

## 2017-09-26 DIAGNOSIS — I10 Essential (primary) hypertension: Secondary | ICD-10-CM | POA: Diagnosis not present

## 2017-09-26 DIAGNOSIS — R7303 Prediabetes: Secondary | ICD-10-CM

## 2017-09-26 NOTE — Patient Instructions (Signed)
DASH Eating Plan DASH stands for "Dietary Approaches to Stop Hypertension." The DASH eating plan is a healthy eating plan that has been shown to reduce high blood pressure (hypertension). It may also reduce your risk for type 2 diabetes, heart disease, and stroke. The DASH eating plan may also help with weight loss. What are tips for following this plan? General guidelines  Avoid eating more than 2,300 mg (milligrams) of salt (sodium) a day. If you have hypertension, you may need to reduce your sodium intake to 1,500 mg a day.  Limit alcohol intake to no more than 1 drink a day for nonpregnant women and 2 drinks a day for men. One drink equals 12 oz of beer, 5 oz of wine, or 1 oz of hard liquor.  Work with your health care provider to maintain a healthy body weight or to lose weight. Ask what an ideal weight is for you.  Get at least 30 minutes of exercise that causes your heart to beat faster (aerobic exercise) most days of the week. Activities may include walking, swimming, or biking.  Work with your health care provider or diet and nutrition specialist (dietitian) to adjust your eating plan to your individual calorie needs. Reading food labels  Check food labels for the amount of sodium per serving. Choose foods with less than 5 percent of the Daily Value of sodium. Generally, foods with less than 300 mg of sodium per serving fit into this eating plan.  To find whole grains, look for the word "whole" as the first word in the ingredient list. Shopping  Buy products labeled as "low-sodium" or "no salt added."  Buy fresh foods. Avoid canned foods and premade or frozen meals. Cooking  Avoid adding salt when cooking. Use salt-free seasonings or herbs instead of table salt or sea salt. Check with your health care provider or pharmacist before using salt substitutes.  Do not fry foods. Cook foods using healthy methods such as baking, boiling, grilling, and broiling instead.  Cook with  heart-healthy oils, such as olive, canola, soybean, or sunflower oil. Meal planning   Eat a balanced diet that includes: ? 5 or more servings of fruits and vegetables each day. At each meal, try to fill half of your plate with fruits and vegetables. ? Up to 6-8 servings of whole grains each day. ? Less than 6 oz of lean meat, poultry, or fish each day. A 3-oz serving of meat is about the same size as a deck of cards. One egg equals 1 oz. ? 2 servings of low-fat dairy each day. ? A serving of nuts, seeds, or beans 5 times each week. ? Heart-healthy fats. Healthy fats called Omega-3 fatty acids are found in foods such as flaxseeds and coldwater fish, like sardines, salmon, and mackerel.  Limit how much you eat of the following: ? Canned or prepackaged foods. ? Food that is high in trans fat, such as fried foods. ? Food that is high in saturated fat, such as fatty meat. ? Sweets, desserts, sugary drinks, and other foods with added sugar. ? Full-fat dairy products.  Do not salt foods before eating.  Try to eat at least 2 vegetarian meals each week.  Eat more home-cooked food and less restaurant, buffet, and fast food.  When eating at a restaurant, ask that your food be prepared with less salt or no salt, if possible. What foods are recommended? The items listed may not be a complete list. Talk with your dietitian about what   dietary choices are best for you. Grains Whole-grain or whole-wheat bread. Whole-grain or whole-wheat pasta. Brown rice. Oatmeal. Quinoa. Bulgur. Whole-grain and low-sodium cereals. Pita bread. Low-fat, low-sodium crackers. Whole-wheat flour tortillas. Vegetables Fresh or frozen vegetables (raw, steamed, roasted, or grilled). Low-sodium or reduced-sodium tomato and vegetable juice. Low-sodium or reduced-sodium tomato sauce and tomato paste. Low-sodium or reduced-sodium canned vegetables. Fruits All fresh, dried, or frozen fruit. Canned fruit in natural juice (without  added sugar). Meat and other protein foods Skinless chicken or turkey. Ground chicken or turkey. Pork with fat trimmed off. Fish and seafood. Egg whites. Dried beans, peas, or lentils. Unsalted nuts, nut butters, and seeds. Unsalted canned beans. Lean cuts of beef with fat trimmed off. Low-sodium, lean deli meat. Dairy Low-fat (1%) or fat-free (skim) milk. Fat-free, low-fat, or reduced-fat cheeses. Nonfat, low-sodium ricotta or cottage cheese. Low-fat or nonfat yogurt. Low-fat, low-sodium cheese. Fats and oils Soft margarine without trans fats. Vegetable oil. Low-fat, reduced-fat, or light mayonnaise and salad dressings (reduced-sodium). Canola, safflower, olive, soybean, and sunflower oils. Avocado. Seasoning and other foods Herbs. Spices. Seasoning mixes without salt. Unsalted popcorn and pretzels. Fat-free sweets. What foods are not recommended? The items listed may not be a complete list. Talk with your dietitian about what dietary choices are best for you. Grains Baked goods made with fat, such as croissants, muffins, or some breads. Dry pasta or rice meal packs. Vegetables Creamed or fried vegetables. Vegetables in a cheese sauce. Regular canned vegetables (not low-sodium or reduced-sodium). Regular canned tomato sauce and paste (not low-sodium or reduced-sodium). Regular tomato and vegetable juice (not low-sodium or reduced-sodium). Pickles. Olives. Fruits Canned fruit in a light or heavy syrup. Fried fruit. Fruit in cream or butter sauce. Meat and other protein foods Fatty cuts of meat. Ribs. Fried meat. Bacon. Sausage. Bologna and other processed lunch meats. Salami. Fatback. Hotdogs. Bratwurst. Salted nuts and seeds. Canned beans with added salt. Canned or smoked fish. Whole eggs or egg yolks. Chicken or turkey with skin. Dairy Whole or 2% milk, cream, and half-and-half. Whole or full-fat cream cheese. Whole-fat or sweetened yogurt. Full-fat cheese. Nondairy creamers. Whipped toppings.  Processed cheese and cheese spreads. Fats and oils Butter. Stick margarine. Lard. Shortening. Ghee. Bacon fat. Tropical oils, such as coconut, palm kernel, or palm oil. Seasoning and other foods Salted popcorn and pretzels. Onion salt, garlic salt, seasoned salt, table salt, and sea salt. Worcestershire sauce. Tartar sauce. Barbecue sauce. Teriyaki sauce. Soy sauce, including reduced-sodium. Steak sauce. Canned and packaged gravies. Fish sauce. Oyster sauce. Cocktail sauce. Horseradish that you find on the shelf. Ketchup. Mustard. Meat flavorings and tenderizers. Bouillon cubes. Hot sauce and Tabasco sauce. Premade or packaged marinades. Premade or packaged taco seasonings. Relishes. Regular salad dressings. Where to find more information:  National Heart, Lung, and Blood Institute: www.nhlbi.nih.gov  American Heart Association: www.heart.org Summary  The DASH eating plan is a healthy eating plan that has been shown to reduce high blood pressure (hypertension). It may also reduce your risk for type 2 diabetes, heart disease, and stroke.  With the DASH eating plan, you should limit salt (sodium) intake to 2,300 mg a day. If you have hypertension, you may need to reduce your sodium intake to 1,500 mg a day.  When on the DASH eating plan, aim to eat more fresh fruits and vegetables, whole grains, lean proteins, low-fat dairy, and heart-healthy fats.  Work with your health care provider or diet and nutrition specialist (dietitian) to adjust your eating plan to your individual   calorie needs. This information is not intended to replace advice given to you by your health care provider. Make sure you discuss any questions you have with your health care provider. Document Released: 05/25/2011 Document Revised: 05/29/2016 Document Reviewed: 05/29/2016 Elsevier Interactive Patient Education  2018 Elsevier Inc.  

## 2017-09-26 NOTE — Progress Notes (Signed)
  Subjective:   Patient ID: Gregory Malone, male    DOB: 11-Jun-1951, 67 y.o.   MRN: [id number] CC: Follow-up (3 month)  HPI: Gregory Malone is a 67 y.o. male presenting for Follow-up (3 month)  Elevated BMI: Regularly has seconds.  Says he eats a lot.  More starches and potatoes that he thinks is ideal.  Stays active with a hiking club.  Does more in the summer than he does in the wintertime.  Is hoping to make bigger changes his weight in the upcoming months.  Hypertension: Taking his blood pressure meds regularly.  Says at home usually in the 130s over 80s.  He thinks the metoprolol slowed him down a little bit, otherwise he is tolerating his medicines fine.  Sometimes feels tired in the afternoon.  Thinks he gets good rest at night.  No morning headaches.  He snores some.  He does not want to be evaluated for sleep apnea.  Relevant past medical, surgical, family and social history reviewed. Allergies and medications reviewed and updated. Social History   Tobacco Use  Smoking Status Former Smoker  . Packs/day: 1.00  . Years: 10.00  . Pack years: 10.00  . Types: Cigarettes  . Last attempt to quit: 06/19/1992  . Years since quitting: 25.2  Smokeless Tobacco Never Used  Tobacco Comment   quit smoking in 1994   ROS: Per HPI   Objective:    BP 136/86   Pulse 74   Temp (!) 97.4 F (36.3 C) (Oral)   Ht 5\' 3"  (1.6 m)   Wt 264 lb 12.8 oz (120.1 kg)   BMI 46.91 kg/m   Wt Readings from Last 3 Encounters:  09/26/17 264 lb 12.8 oz (120.1 kg)  06/27/17 260 lb 3.2 oz (118 kg)  01/18/17 226 lb 6.4 oz (102.7 kg)    Gen: NAD, alert, cooperative with exam, NCAT EYES: EOMI, no conjunctival injection, or no icterus CV: NRRR, normal S1/S2, no murmur, distal pulses 2+ b/l Resp: CTABL, no wheezes, normal WOB Abd: +BS, soft, NTND. no guarding or organomegaly Ext: No edema, warm Neuro: Alert and oriented, strength equal b/l UE and LE, coordination grossly normal MSK: normal muscle  bulk  Assessment & Plan:  Gregory Malone was seen today for follow-up multiple medical problems.  Diagnoses and all orders for this visit:  Essential hypertension, benign Adequate control today.  Continue to check at home.  We will get blood work next time  Pre-diabetes Goal 5-10 pound weight loss before next visit.  Plan to stop having seconds.  Increase vegetable intake in general.  BMI 45.0-49.9, adult (HCC) Lifestyle changes discussed.  Follow up plan: Return in about 3 months (around 12/26/2017). [name], MD [place]

## 2017-10-06 ENCOUNTER — Other Ambulatory Visit: Payer: Self-pay | Admitting: Pediatrics

## 2017-11-06 ENCOUNTER — Encounter: Payer: Self-pay | Admitting: Gastroenterology

## 2017-11-15 ENCOUNTER — Other Ambulatory Visit: Payer: Self-pay

## 2017-11-15 ENCOUNTER — Ambulatory Visit (AMBULATORY_SURGERY_CENTER): Payer: Self-pay | Admitting: *Deleted

## 2017-11-15 VITALS — Ht 63.0 in | Wt 260.0 lb

## 2017-11-15 DIAGNOSIS — Z86010 Personal history of colonic polyps: Secondary | ICD-10-CM

## 2017-11-15 MED ORDER — PEG-KCL-NACL-NASULF-NA ASC-C 140 G PO SOLR: 1.0000 | Freq: Once | ORAL | 0 refills | Status: AC

## 2017-11-15 NOTE — Progress Notes (Signed)
No egg or soy allergy known to patient  No issues with past sedation with any surgeries  or procedures, no intubation problems  No diet pills per patient No home 02 use per patient  No blood thinners per patient  Pt denies issues with constipation  No A fib or A flutter  EMMI video offered to the patient, declined. Sample Plenvu given to the patient, Lot [id number],12/2018.

## 2017-11-22 ENCOUNTER — Encounter: Payer: Self-pay | Admitting: Gastroenterology

## 2017-11-28 ENCOUNTER — Ambulatory Visit (AMBULATORY_SURGERY_CENTER): Payer: PPO | Admitting: Gastroenterology

## 2017-11-28 ENCOUNTER — Other Ambulatory Visit: Payer: Self-pay

## 2017-11-28 ENCOUNTER — Encounter: Payer: Self-pay | Admitting: Gastroenterology

## 2017-11-28 VITALS — BP 118/74 | HR 66 | Temp 98.4°F | Resp 18 | Ht 63.0 in | Wt 264.0 lb

## 2017-11-28 DIAGNOSIS — D123 Benign neoplasm of transverse colon: Secondary | ICD-10-CM

## 2017-11-28 DIAGNOSIS — I2510 Atherosclerotic heart disease of native coronary artery without angina pectoris: Secondary | ICD-10-CM | POA: Diagnosis not present

## 2017-11-28 DIAGNOSIS — D122 Benign neoplasm of ascending colon: Secondary | ICD-10-CM

## 2017-11-28 DIAGNOSIS — D128 Benign neoplasm of rectum: Secondary | ICD-10-CM

## 2017-11-28 DIAGNOSIS — I10 Essential (primary) hypertension: Secondary | ICD-10-CM | POA: Diagnosis not present

## 2017-11-28 DIAGNOSIS — K621 Rectal polyp: Secondary | ICD-10-CM | POA: Diagnosis not present

## 2017-11-28 DIAGNOSIS — Z86010 Personal history of colonic polyps: Secondary | ICD-10-CM | POA: Diagnosis not present

## 2017-11-28 DIAGNOSIS — D129 Benign neoplasm of anus and anal canal: Secondary | ICD-10-CM

## 2017-11-28 HISTORY — PX: COLONOSCOPY: SHX174

## 2017-11-28 MED ORDER — SODIUM CHLORIDE 0.9 % IV SOLN: 500.0000 mL | Freq: Once | INTRAVENOUS | Status: DC

## 2017-11-28 NOTE — Op Note (Signed)
[place] Patient Name: Hansel Devan Procedure Date: 11/28/2017 11:17 AM MRN: [id number] Endoscopist: Mallie Mussel L. [name] , MD Age: 67 Referring MD:  Date of Birth: [birth year] Gender: Male Account #: [id number] Procedure:                Colonoscopy Indications:              Surveillance: History of numerous (> 10) adenomas                            on last colonoscopy (< 3 yrs) Medicines:                Monitored Anesthesia Care Procedure:                Pre-Anesthesia Assessment:                           - Prior to the procedure, a History and Physical                            was performed, and patient medications and                            allergies were reviewed. The patient's tolerance of                            previous anesthesia was also reviewed. The risks                            and benefits of the procedure and the sedation                            options and risks were discussed with the patient.                            All questions were answered, and informed consent                            was obtained. Anticoagulants: The patient has taken                            aspirin. It was decided not to withhold this                            medication prior to the procedure. ASA Grade                            Assessment: III - A patient with severe systemic                            disease. After reviewing the risks and benefits,                            the patient was deemed in satisfactory condition to  undergo the procedure.                           After obtaining informed consent, the colonoscope                            was passed under direct vision. Throughout the                            procedure, the patient's blood pressure, pulse, and                            oxygen saturations were monitored continuously. The                            Colonoscope was introduced through the anus and                            advanced to the the cecum, identified by                            appendiceal orifice and ileocecal valve. The                            colonoscopy was performed without difficulty. The                            patient tolerated the procedure well. The quality                            of the bowel preparation was good. The ileocecal                            valve, appendiceal orifice, and rectum were                            photographed. The quality of the bowel preparation                            was evaluated using the BBPS [facility] Bowel                            Preparation Scale) with scores of: Right Colon = 2,                            Transverse Colon = 2 and Left Colon = 2. The total                            BBPS score equals 6.,after lavage. Scope In: 11:23:02 AM Scope Out: 11:47:58 AM Scope Withdrawal Time: 0 hours 22 minutes 58 seconds  Total Procedure Duration: 0 hours 24 minutes 56 seconds  Findings:                 The perianal and digital rectal examinations were  normal.                           Many small and large-mouthed diverticula were found                            from sigmoid to transverse colon.                           A 8-10 mm polyp was found in the ascending colon.                            The polyp was flat. The polyp was removed with a                            hot snare. Resection and retrieval were complete.                            (Jar 1)                           Two sessile polyps were found in the transverse                            colon. The polyps were 2 to 4 mm in size. These                            polyps were removed with a cold snare. Resection                            and retrieval were complete. (Jar 1)                           A 8 mm polyp was found in the transverse colon. The                            polyp was sessile. The polyp was removed with a hot                             snare. Resection and retrieval were complete. (Jar                            2)                           A 4 mm polyp was found in the rectum. The polyp was                            sessile. The polyp was removed with a cold snare.                            Resection and retrieval were complete. (Jar 2)  The exam was otherwise without abnormality on                            direct and retroflexion views. Complications:            No immediate complications. Estimated Blood Loss:     Estimated blood loss was minimal. Impression:               - Diverticulosis from sigmoid to transverse colon.                           - One 8 mm polyp in the ascending colon, removed                            with a hot snare. Resected and retrieved.                           - Two 2 to 4 mm polyps in the transverse colon,                            removed with a cold snare. Resected and retrieved.                           - One 8-10 mm polyp in the transverse colon,                            removed with a hot snare. Resected and retrieved.                           - One 4 mm polyp in the rectum, removed with a cold                            snare. Resected and retrieved.                           - The examination was otherwise normal on direct                            and retroflexion views. Recommendation:           - Patient has a contact number available for                            emergencies. The signs and symptoms of potential                            delayed complications were discussed with the                            patient. Return to normal activities tomorrow.                            Written discharge instructions were provided to the  patient.                           - Resume previous diet.                           - Continue present medications.                           - Await  pathology results.                           - Repeat colonoscopy is recommended for                            surveillance. The colonoscopy date will be                            determined after pathology results from today's                            exam become available for review. Henry L. [name], MD 11/28/2017 11:54:52 AM This report has been signed electronically.

## 2017-11-28 NOTE — Progress Notes (Signed)
Called to room to assist during endoscopic procedure.  Patient ID and intended procedure confirmed with present staff. Received instructions for my participation in the procedure from the performing physician.  

## 2017-11-28 NOTE — Patient Instructions (Signed)
YOU HAD AN ENDOSCOPIC PROCEDURE TODAY AT THE  ENDOSCOPY CENTER:   Refer to the procedure report that was given to you for any specific questions about what was found during the examination.  If the procedure report does not answer your questions, please call your gastroenterologist to clarify.  If you requested that your care partner not be given the details of your procedure findings, then the procedure report has been included in a sealed envelope for you to review at your convenience later.  YOU SHOULD EXPECT: Some feelings of bloating in the abdomen. Passage of more gas than usual.  Walking can help get rid of the air that was put into your GI tract during the procedure and reduce the bloating. If you had a lower endoscopy (such as a colonoscopy or flexible sigmoidoscopy) you may notice spotting of blood in your stool or on the toilet paper. If you underwent a bowel prep for your procedure, you may not have a normal bowel movement for a few days.  Please Note:  You might notice some irritation and congestion in your nose or some drainage.  This is from the oxygen used during your procedure.  There is no need for concern and it should clear up in a day or so.  SYMPTOMS TO REPORT IMMEDIATELY:   Following lower endoscopy (colonoscopy or flexible sigmoidoscopy):  Excessive amounts of blood in the stool  Significant tenderness or worsening of abdominal pains  Swelling of the abdomen that is new, acute  Fever of 100F or higher  PLease see handouts given to you on Polyps and Diverticulosis.  For urgent or emergent issues, a gastroenterologist can be reached at any hour by calling [phone].   DIET:  We do recommend a small meal at first, but then you may proceed to your regular diet.  Drink plenty of fluids but you should avoid alcoholic beverages for 24 hours.  ACTIVITY:  You should plan to take it easy for the rest of today and you should NOT DRIVE or use heavy machinery until  tomorrow (because of the sedation medicines used during the test).    FOLLOW UP: Our staff will call the number listed on your records the next business day following your procedure to check on you and address any questions or concerns that you may have regarding the information given to you following your procedure. If we do not reach you, we will leave a message.  However, if you are feeling well and you are not experiencing any problems, there is no need to return our call.  We will assume that you have returned to your regular daily activities without incident.  If any biopsies were taken you will be contacted by phone or by letter within the next 1-3 weeks.  Please call us at [phone] if you have not heard about the biopsies in 3 weeks.    SIGNATURES/CONFIDENTIALITY: You and/or your care partner have signed paperwork which will be entered into your electronic medical record.  These signatures attest to the fact that that the information above on your After Visit Summary has been reviewed and is understood.  Full responsibility of the confidentiality of this discharge information lies with you and/or your care-partner.  Thank you for letting us take care of your healthcare needs today. 

## 2017-11-28 NOTE — Progress Notes (Signed)
Report given to PACU, vss 

## 2017-11-29 ENCOUNTER — Telehealth: Payer: Self-pay | Admitting: *Deleted

## 2017-11-29 ENCOUNTER — Telehealth: Payer: Self-pay

## 2017-11-29 NOTE — Telephone Encounter (Signed)
No answer. Unable to leave message, no voicemail set up.

## 2017-11-29 NOTE — Telephone Encounter (Signed)
  Follow up Call-  Call back number 11/28/2017 11/23/2016  Post procedure Call Back phone  # [phone]  Permission to leave phone message No No  comments does not have voice mail -  Some recent data might be hidden     Patient questions:  Do you have a fever, pain , or abdominal swelling? No. Pain Score  0 *  Have you tolerated food without any problems? Yes.    Have you been able to return to your normal activities? Yes.    Do you have any questions about your discharge instructions: Diet   No. Medications  No. Follow up visit  No.  Do you have questions or concerns about your Care? No.  Actions: * If pain score is 4 or above: No action needed, pain <4.

## 2017-12-05 ENCOUNTER — Encounter: Payer: Self-pay | Admitting: Gastroenterology

## 2017-12-27 ENCOUNTER — Ambulatory Visit (INDEPENDENT_AMBULATORY_CARE_PROVIDER_SITE_OTHER): Payer: PPO | Admitting: Pediatrics

## 2017-12-27 ENCOUNTER — Encounter: Payer: Self-pay | Admitting: Pediatrics

## 2017-12-27 VITALS — BP 135/84 | HR 70 | Temp 98.8°F | Ht 63.0 in | Wt 256.0 lb

## 2017-12-27 DIAGNOSIS — Z6841 Body Mass Index (BMI) 40.0 and over, adult: Secondary | ICD-10-CM | POA: Diagnosis not present

## 2017-12-27 DIAGNOSIS — I10 Essential (primary) hypertension: Secondary | ICD-10-CM | POA: Diagnosis not present

## 2017-12-27 LAB — BASIC METABOLIC PANEL
BUN/Creatinine Ratio: 17 (ref 10–24)
BUN: 21 mg/dL (ref 8–27)
CO2: 23 mmol/L (ref 20–29)
Calcium: 9.5 mg/dL (ref 8.6–10.2)
Chloride: 104 mmol/L (ref 96–106)
Creatinine, Ser: 1.27 mg/dL (ref 0.76–1.27)
GFR, EST AFRICAN AMERICAN: 68 mL/min/{1.73_m2} (ref >59)
GFR, EST NON AFRICAN AMERICAN: 59 mL/min/{1.73_m2} — AB (ref >59)
Glucose: 109 mg/dL — ABNORMAL HIGH (ref 65–99)
POTASSIUM: 4 mmol/L (ref 3.5–5.2)
SODIUM: 143 mmol/L (ref 134–144)

## 2017-12-27 NOTE — Progress Notes (Signed)
  Subjective:   Patient ID: Gregory Malone, male    DOB: 06-30-1950, 67 y.o.   MRN: [id number] CC: Follow-up Weight and blood pressure HPI: Gregory Malone is a 67 y.o. male   Elevated BMI: Has been working at controlling portion sizes.  Rarely having seconds.  Mostly drinking water.  Increasing fruits and vegetables.  Avoiding rice and potatoes.  He has been pleased with weight loss.  It has taken longer than what it used to take.  He states regularly active, hiking.  Hypertension: Taking his medicines regularly.  No lightheadedness or dizziness.  No chest pain or pressure with exercise.  Able to do a lot more hiking this year after ankle injury is healed.  5 yellowjacket stings yesterday.  Feeling okay now.  Sites are healing.  No history of allergy.  Has not coughed up any blood recently.  He wonders if it could have been coming from his mouth, has a toothpaste from his dentist now to help prevent bleeding.  Relevant past medical, surgical, family and social history reviewed. Allergies and medications reviewed and updated. Social History   Tobacco Use  Smoking Status Former Smoker  . Packs/day: 1.00  . Years: 10.00  . Pack years: 10.00  . Types: Cigarettes  . Last attempt to quit: 06/19/1992  . Years since quitting: 25.5  Smokeless Tobacco Never Used  Tobacco Comment   quit smoking in 1994   ROS: Per HPI   Objective:    BP 135/84   Pulse 70   Temp 98.8 F (37.1 C)   Ht 5\' 3"  (1.6 m)   Wt 256 lb (116.1 kg)   BMI 45.35 kg/m   Wt Readings from Last 3 Encounters:  12/27/17 256 lb (116.1 kg)  11/28/17 264 lb (119.7 kg)  11/15/17 260 lb (117.9 kg)    Gen: NAD, alert, cooperative with exam, NCAT EYES: EOMI, no conjunctival injection, or no icterus CV: NRRR, normal S1/S2, no murmur, distal pulses 2+ b/l Resp: CTABL, no wheezes, normal WOB Abd: +BS, soft, NTND. Ext: No edema, warm Neuro: Alert and oriented, strength equal b/l UE and LE, coordination grossly  normal  Assessment & Plan:  Gregory Malone was seen today for follow-up medical problems.  Diagnoses and all orders for this visit:  Essential hypertension Stable, continue current medicines -     Basic Metabolic Panel  BMI 60.6-00.4, adult (HCC) 8-9 pound weight loss over the last 3 months.  Continue dietary changes.  Wintertime tends to be a hard time for him.  Will follow up with me in 3 months.  Counseled on nutrition and activity.  Discussed medicines as an option for weight loss and nutrition visit, patient declined.  Follow up plan: Return in about 3 months (around 03/29/2018). [name], MD [place]

## 2018-01-09 ENCOUNTER — Other Ambulatory Visit: Payer: Self-pay | Admitting: Pediatrics

## 2018-01-10 ENCOUNTER — Ambulatory Visit (INDEPENDENT_AMBULATORY_CARE_PROVIDER_SITE_OTHER): Payer: PPO | Admitting: Pediatrics

## 2018-01-10 ENCOUNTER — Encounter: Payer: Self-pay | Admitting: Pediatrics

## 2018-01-10 VITALS — BP 122/74 | HR 61 | Temp 97.4°F | Ht 63.0 in | Wt 256.2 lb

## 2018-01-10 DIAGNOSIS — Z0000 Encounter for general adult medical examination without abnormal findings: Secondary | ICD-10-CM

## 2018-01-10 DIAGNOSIS — Z23 Encounter for immunization: Secondary | ICD-10-CM

## 2018-01-10 NOTE — Progress Notes (Signed)
Subjective:   Gregory Malone is a 67 y.o. male who presents for a Welcome to Medicare exam.   Feeling well overall.  Seen 2 weeks ago for follow-up visit for medical problems.  Review of Systems: No chest pain, SOB, difficulty with urination, trouble emptying his bladder, trouble passing stools.  No bloody stools.  Normal appetite.  No URI symptoms, runny nose, coughing, fevers. Cardiac Risk Factors include: advanced age (>45men, >71 women);dyslipidemia;hypertension;obesity (BMI >30kg/m2);male gender     Objective:    Today's Vitals   01/10/18 0915  BP: 122/74  Pulse: 61  Temp: (!) 97.4 F (36.3 C)  TempSrc: Oral  Weight: 256 lb 4 oz (116.2 kg)  Height: 5\' 3"  (1.6 m)   Body mass index is 45.39 kg/m.  Medications Outpatient Encounter Medications as of 01/10/2018  Medication Sig  . amLODipine (NORVASC) 10 MG tablet Take 1 tablet (10 mg total) by mouth daily.  Marland Kitchen aspirin EC 81 MG tablet Take 1 tablet (81 mg total) by mouth daily.  Marland Kitchen atorvastatin (LIPITOR) 80 MG tablet Take 1 tablet (80 mg total) by mouth daily.  Marland Kitchen ibuprofen (ADVIL,MOTRIN) 200 MG tablet Take 200 mg by mouth every 6 (six) hours as needed.  Marland Kitchen KLOR-CON M20 20 MEQ tablet TAKE 1 TABLET EVERY DAY  . losartan-hydrochlorothiazide (HYZAAR) 100-25 MG tablet Take 1 tablet by mouth daily.  . metoprolol tartrate (LOPRESSOR) 25 MG tablet Take 1 tablet (25 mg total) by mouth 2 (two) times daily.  . [DISCONTINUED] KLOR-CON M20 20 MEQ tablet TAKE 1 TABLET EVERY DAY   Facility-Administered Encounter Medications as of 01/10/2018  Medication  . 0.9 %  sodium chloride infusion  . 0.9 %  sodium chloride infusion     History: Past Medical History:  Diagnosis Date  . Asthma   . Bowel obstruction ([place])   . CAD (coronary artery disease)   . Chronic kidney disease    nephrolithiasis  couple episodes 10 years ago  . GERD (gastroesophageal reflux disease)   . Hx of colonic polyp   . Hypercholesterolemia   . Hypertension     . Prostate cancer ([place]) 03/26/12 bx   ,gleason=3+3=6,PSA=4.22,volume=34cc  . Pulmonary nodule   . Radiation 06/27/2012-08/20/2012   prostate 7600 cGy 38 sessions   Past Surgical History:  Procedure Laterality Date  . COLONOSCOPY    . POLYPECTOMY    . PROSTATE BIOPSY  03/26/12   adenocarcinoma  . TONSILLECTOMY    . UPPER GASTROINTESTINAL ENDOSCOPY      Family History  Problem Relation Age of Onset  . Prostate cancer Father 58  . Hypertension Father   . Hyperlipidemia Father   . Prostate cancer Maternal Grandfather 7  . Prostate cancer Paternal Uncle   . Prostate cancer Cousin   . Hypertension Mother   . Uterine cancer Sister   . Diabetes Sister   . Colon cancer Maternal Aunt   . Colon cancer Cousin        x 2  . Stomach cancer Neg Hx   . Rectal cancer Neg Hx   . Pancreatic cancer Neg Hx   . Esophageal cancer Neg Hx    Social History   Occupational History  . 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 Smoking status: Former Smoker    Packs/day: 1.00    Years: 10.00    Pack years: 10.00    Types: Cigarettes    Last attempt to quit: 06/19/1992  Years since quitting: 25.5  . Smokeless tobacco: Never Used  . Tobacco comment: quit smoking in 1994  Substance and Sexual Activity  . Alcohol use: Yes    Comment: 1  beer  occasionally social  . Drug use: No  . Sexual activity: Yes   Tobacco Counseling Counseling given: Not Answered Comment: quit smoking in 1994   Immunizations and Health Maintenance There is no immunization history for the selected administration types on file for this patient. Health Maintenance Due  Topic Date Due  . HIV Screening  06/03/1967    Activities of Daily Living In your present state of health, do you have any difficulty performing the following activities: 01/10/2018  Hearing? N  Vision? N  Difficulty concentrating or making decisions? N  Walking or climbing stairs? N  Dressing or  bathing? N  Doing errands, shopping? N  Preparing Food and eating ? N  Using the Toilet? N  In the past six months, have you accidently leaked urine? N  Do you have problems with loss of bowel control? N  Managing your Medications? N  Managing your Finances? N  Housekeeping or managing your Housekeeping? N  Some recent data might be hidden    Physical Exam General: Well-appearing, no acute distress.  ENT: Moist mucous membranes, extraocular movements intact. Cardiac: Normal rate and rhythm, no murmurs.  Respiratory: Moving air well,  normal work of breathing, clear to auscultation bilaterally.  Extremities: No swelling.  Advanced Directives: Does Patient Have a Medical Advance Directive?: No Would patient like information on creating a medical advance directive?: No - Patient declined    Assessment:    This is a routine wellness.  Vision/Hearing screen  Visual Acuity Screening   Right eye Left eye Both eyes  Without correction: 20/30 20/40 20/30   With correction:       Dietary issues and exercise activities discussed:  Current Exercise Habits: The patient has a physically strenous job, but has no regular exercise apart from work., Exercise limited by: None identified  Goals    None      Depression Screen PHQ 2/9 Scores 01/10/2018 12/27/2017 09/26/2017 06/27/2017  PHQ - 2 Score 0 0 0 0     Fall Risk Fall Risk  01/10/2018  Falls in the past year? No    Cognitive Function MMSE - Mini Mental State Exam 01/10/2018  Orientation to time 5  Orientation to Place 5  Registration 3  Attention/ Calculation 5  Recall 2  Language- name 2 objects 2  Language- repeat 1  Language- follow 3 step command 3  Language- read & follow direction 1  Write a sentence 1  Copy design 1  Total score 29        Patient Care Team: Eustaquio Maize, MD as PCP - General (Pediatrics)     Plan:   History of prostate cancer: Gets yearly PSAs forwarded to his urologist.  Next due in January  2019 Prediabetes: Continue to encourage healthy lifestyle changes.  I have personally reviewed and noted the following in the patient's chart:   . Medical and social history . Use of alcohol, tobacco or illicit drugs  . Current medications and supplements . Functional ability and status . Nutritional status . Physical activity, continue hiking . Advanced directives gave patient paperwork on advance directives.  He will discuss with family, fill out, bring back. . List of other physicians . Hospitalizations, surgeries, and ER visits in previous 12 months . Vitals . Screenings to include  cognitive, depression, and falls . Referrals and appointments, follow-up as scheduled in 3 months  In addition, I have reviewed and discussed with patient certain preventive protocols, quality metrics, and best practice recommendations. A written personalized care plan for preventive services as well as general preventive health recommendations were provided to patient.    Eustaquio Maize, MD 01/10/2018

## 2018-01-29 ENCOUNTER — Other Ambulatory Visit: Payer: Self-pay | Admitting: Pediatrics

## 2018-01-29 DIAGNOSIS — I10 Essential (primary) hypertension: Secondary | ICD-10-CM

## 2018-03-29 ENCOUNTER — Ambulatory Visit: Payer: PPO | Admitting: Pediatrics

## 2018-04-19 ENCOUNTER — Other Ambulatory Visit: Payer: Self-pay | Admitting: Pediatrics

## 2018-04-22 ENCOUNTER — Ambulatory Visit (INDEPENDENT_AMBULATORY_CARE_PROVIDER_SITE_OTHER): Payer: PPO | Admitting: Pediatrics

## 2018-04-22 ENCOUNTER — Encounter: Payer: Self-pay | Admitting: Pediatrics

## 2018-04-22 VITALS — BP 139/88 | HR 73 | Temp 97.7°F | Ht 63.0 in | Wt 263.2 lb

## 2018-04-22 DIAGNOSIS — Z6841 Body Mass Index (BMI) 40.0 and over, adult: Secondary | ICD-10-CM

## 2018-04-22 DIAGNOSIS — R7303 Prediabetes: Secondary | ICD-10-CM

## 2018-04-22 DIAGNOSIS — Z23 Encounter for immunization: Secondary | ICD-10-CM | POA: Diagnosis not present

## 2018-04-22 DIAGNOSIS — I10 Essential (primary) hypertension: Secondary | ICD-10-CM

## 2018-04-22 DIAGNOSIS — Z8546 Personal history of malignant neoplasm of prostate: Secondary | ICD-10-CM

## 2018-04-22 DIAGNOSIS — I2510 Atherosclerotic heart disease of native coronary artery without angina pectoris: Secondary | ICD-10-CM

## 2018-04-22 DIAGNOSIS — E785 Hyperlipidemia, unspecified: Secondary | ICD-10-CM | POA: Diagnosis not present

## 2018-04-22 LAB — BAYER DCA HB A1C WAIVED: HB A1C (BAYER DCA - WAIVED): 6 % (ref <7.0)

## 2018-04-22 MED ORDER — AMLODIPINE BESYLATE 10 MG PO TABS: 10.0000 mg | ORAL_TABLET | Freq: Every day | ORAL | 1 refills | Status: DC

## 2018-04-22 MED ORDER — METOPROLOL TARTRATE 25 MG PO TABS: 25.0000 mg | ORAL_TABLET | Freq: Two times a day (BID) | ORAL | 1 refills | Status: DC

## 2018-04-22 MED ORDER — HYDROCHLOROTHIAZIDE 25 MG PO TABS: 25.0000 mg | ORAL_TABLET | Freq: Every day | ORAL | 3 refills | Status: DC

## 2018-04-22 MED ORDER — LISINOPRIL 40 MG PO TABS: 40.0000 mg | ORAL_TABLET | Freq: Every day | ORAL | 3 refills | Status: DC

## 2018-04-22 MED ORDER — ATORVASTATIN CALCIUM 80 MG PO TABS: 80.0000 mg | ORAL_TABLET | Freq: Every day | ORAL | 1 refills | Status: DC

## 2018-04-22 NOTE — Progress Notes (Signed)
Subjective:   Patient ID: Gregory Malone, male    DOB: 1951-03-29, 67 y.o.   MRN: [id number] CC: Medical Management of Chronic Issues  HPI: Gregory Malone is a 67 y.o. male   HTN: at home 130s/80s. Has been out of losartan for several days.  Says he has upcoming CT scan to reevaluate nodules seen on 10/2016 scan, does have a smoking history.  Elevated BMI: Hiking up to two miles regularly. Spent a month in [place] with family, ate a lot of rice and gravy, now back on low carb diet.  No chest pain or shortness of breath with exertion.  No further trouble with leg pain.  Injury seems to have resolved.  Relevant past medical, surgical, family and social history reviewed. Allergies and medications reviewed and updated. Social History   Tobacco Use  Smoking Status Former Smoker  . Packs/day: 1.00  . Years: 10.00  . Pack years: 10.00  . Types: Cigarettes  . Last attempt to quit: 06/19/1992  . Years since quitting: 25.8  Smokeless Tobacco Never Used  Tobacco Comment   quit smoking in 1994   ROS: Per HPI   Objective:    BP 139/88   Pulse 73   Temp 97.7 F (36.5 C) (Oral)   Ht '5\' 3"'$  (1.6 m)   Wt 263 lb 3.2 oz (119.4 kg)   BMI 46.62 kg/m   Wt Readings from Last 3 Encounters:  04/22/18 263 lb 3.2 oz (119.4 kg)  01/10/18 256 lb 4 oz (116.2 kg)  12/27/17 256 lb (116.1 kg)    Gen: NAD, alert, cooperative with exam, NCAT EYES: EOMI, no conjunctival injection, or no icterus ENT:   OP without erythema LYMPH: no cervical LAD CV: NRRR, normal S1/S2, no murmur, distal pulses 2+ b/l Resp: CTABL, no wheezes, normal WOB Abd: +BS, soft, NTND. Ext: No edema, warm Neuro: Alert and oriented MSK: normal muscle bulk  Assessment & Plan:  Gregory Malone was seen today for medical management of chronic issues.  Diagnoses and all orders for this visit:  Essential hypertension Slightly elevated today, usually better at home.  Patient to continue to follow at home, let me know if persistently  high.  His combination medicine losartan/HCTZ is no longer available.  He has been on lisinopril in the past without side effects.  Will switch back to lisinopril 40 mg, hydrochlorothiazide 25 mg. -     CMP14+EGFR -     PSA, total and free -     Lipid panel -     amLODipine (NORVASC) 10 MG tablet; Take 1 tablet (10 mg total) by mouth daily. -     hydrochlorothiazide (HYDRODIURIL) 25 MG tablet; Take 1 tablet (25 mg total) by mouth daily. -     lisinopril (PRINIVIL,ZESTRIL) 40 MG tablet; Take 1 tablet (40 mg total) by mouth daily.  Pre-diabetes A1c 6.0. -     Bayer DCA Hb A1c Waived -     CMP14+EGFR -     PSA, total and free -     Lipid panel  Hyperlipidemia, unspecified hyperlipidemia type Stable, continue atorvastatin -     Bayer DCA Hb A1c Waived -     CMP14+EGFR -     PSA, total and free -     Lipid panel -     atorvastatin (LIPITOR) 80 MG tablet; Take 1 tablet (80 mg total) by mouth daily.  H/O prostate cancer Will get PSA, forward to urology per patient request. -  CMP14+EGFR -     PSA, total and free -     Lipid panel  Coronary artery disease involving native heart without angina pectoris, unspecified vessel or lesion type Asymptomatic.  Visible on CT scan. -     metoprolol tartrate (LOPRESSOR) 25 MG tablet; Take 1 tablet (25 mg total) by mouth 2 (two) times daily.  Elevated BMI Continue lifestyle changes, avoiding carbohydrates, watching portion sizes, regularly exercising.  Follow up plan: Return in about 3 months (around 07/23/2018). [name], MD [place]

## 2018-04-22 NOTE — Patient Instructions (Signed)
Let me know if BP is regularly >135 or >85

## 2018-04-23 LAB — CMP14+EGFR
ALBUMIN: 3.9 g/dL (ref 3.6–4.8)
ALT: 22 IU/L (ref 0–44)
AST: 23 IU/L (ref 0–40)
Albumin/Globulin Ratio: 1.7 (ref 1.2–2.2)
Alkaline Phosphatase: 102 IU/L (ref 39–117)
BILIRUBIN TOTAL: 0.4 mg/dL (ref 0.0–1.2)
BUN/Creatinine Ratio: 18 (ref 10–24)
BUN: 24 mg/dL (ref 8–27)
CHLORIDE: 102 mmol/L (ref 96–106)
CO2: 23 mmol/L (ref 20–29)
Calcium: 9.1 mg/dL (ref 8.6–10.2)
Creatinine, Ser: 1.33 mg/dL — ABNORMAL HIGH (ref 0.76–1.27)
GFR calc Af Amer: 64 mL/min/{1.73_m2} (ref >59)
GFR calc non Af Amer: 56 mL/min/{1.73_m2} — ABNORMAL LOW (ref >59)
Globulin, Total: 2.3 g/dL (ref 1.5–4.5)
Glucose: 100 mg/dL — ABNORMAL HIGH (ref 65–99)
Potassium: 3.6 mmol/L (ref 3.5–5.2)
Sodium: 140 mmol/L (ref 134–144)
TOTAL PROTEIN: 6.2 g/dL (ref 6.0–8.5)

## 2018-04-23 LAB — LIPID PANEL
CHOLESTEROL TOTAL: 112 mg/dL (ref 100–199)
Chol/HDL Ratio: 3.5 ratio (ref 0.0–5.0)
HDL: 32 mg/dL — AB (ref >39)
LDL CALC: 60 mg/dL (ref 0–99)
TRIGLYCERIDES: 102 mg/dL (ref 0–149)
VLDL CHOLESTEROL CAL: 20 mg/dL (ref 5–40)

## 2018-04-23 LAB — PSA, TOTAL AND FREE
PROSTATE SPECIFIC AG, SERUM: 0.5 ng/mL (ref 0.0–4.0)
PSA, Free Pct: 20 %
PSA, Free: 0.1 ng/mL

## 2018-04-24 ENCOUNTER — Telehealth: Payer: Self-pay | Admitting: Pulmonary Disease

## 2018-04-24 DIAGNOSIS — R911 Solitary pulmonary nodule: Secondary | ICD-10-CM

## 2018-04-24 DIAGNOSIS — R918 Other nonspecific abnormal finding of lung field: Secondary | ICD-10-CM

## 2018-04-24 NOTE — Telephone Encounter (Signed)
Patient has set up follow up appointment and stated that he thought he was supposed to have follow up CT with this. Per last OV note from 2018 patient was recommended to have follow up one year CT.   Dr. Vaughan Browner please advise, thank you.

## 2018-04-25 NOTE — Telephone Encounter (Signed)
Attempted to call Patient.  Unable to leave message for Patient, because VM is not set up at this time. Per Dr. Vaughan Browner- 04/24/18   Yes. Order follow up CT without contrast in 1 year     Order for CT Chest without contrast placed for 1 year.

## 2018-04-25 NOTE — Telephone Encounter (Signed)
Yes. Order follow up CT without contrast in 1 year

## 2018-04-26 NOTE — Telephone Encounter (Signed)
Ok thanks for the clarification. Will order CT and call pt to reschedule appt for a later date. CT scan ordered. After CT appt time, plese route back to triage to make the appt.

## 2018-04-26 NOTE — Telephone Encounter (Signed)
Attempted to call patient today regarding previous message from pt. I did not receive an answer at time of call. WCB X1 Leaving in triage

## 2018-04-26 NOTE — Telephone Encounter (Signed)
Yes. Order CT scan now.  Push back clinic appointment until after CT so we can review at office visit.

## 2018-04-26 NOTE — Addendum Note (Signed)
Addended by: Jannette Spanner on: 04/26/2018 02:39 PM   Modules accepted: Orders

## 2018-04-26 NOTE — Telephone Encounter (Signed)
Attempted to contact pt. I did not receive an answer. I have left a message for pt to return our call.  

## 2018-04-26 NOTE — Telephone Encounter (Signed)
Spoke with pt, advised him that Dr. Vaughan Browner did want him to have a CT scan in one year. The order has been placed and the pt requests to have the CT done at [facility]. FYI he doesn't have VM. PCC's can we make sure this is scheduled to be done before 04/29/2018? If not its ok, I just wanted to see because he has an appt on that day with Dr. Vaughan Browner. If not maybe we can move his appt back after CT is scheduled. Thanks.

## 2018-04-26 NOTE — Telephone Encounter (Signed)
Dr. Vaughan Browner the pt had his last CT scan in 11/27/16. Do you want him to wait until 28-Nov-2018? I just want to clarify this before we go any further. The last CT report recommended a repeat CT in 12 months which would have been November 27, 2017 which has passed so I was thinking you wanted it done now, correct? Pt would like to review the CT scan at the [place] visit which I explained to him that he would most likely have to push his appt back due to CT at [facility] availability. Please advise.

## 2018-04-26 NOTE — Telephone Encounter (Addendum)
I just called the schedulers to see if Forestine Na had anything for today to get the CT scheduled no appts available. Keene Breath stated the only way the CT could be scheduled for today would be if the CT order was put in STAT.

## 2018-04-26 NOTE — Telephone Encounter (Signed)
Plus the CT ordered yesterday doesn't start until 04/2019. The patient's last CT was done 10/19/16.

## 2018-04-26 NOTE — Telephone Encounter (Signed)
Patient calling back to check on CT scan, patient call back # [phone]

## 2018-04-29 ENCOUNTER — Ambulatory Visit: Payer: PPO | Admitting: Pulmonary Disease

## 2018-05-15 ENCOUNTER — Ambulatory Visit (HOSPITAL_COMMUNITY)
Admission: RE | Admit: 2018-05-15 | Discharge: 2018-05-15 | Disposition: A | Discharge status: Home / Self Care | Payer: PPO | Source: Ambulatory Visit | Attending: Pulmonary Disease | Admitting: Pulmonary Disease

## 2018-05-15 DIAGNOSIS — R911 Solitary pulmonary nodule: Secondary | ICD-10-CM | POA: Insufficient documentation

## 2018-05-15 DIAGNOSIS — R918 Other nonspecific abnormal finding of lung field: Secondary | ICD-10-CM | POA: Diagnosis not present

## 2018-05-23 ENCOUNTER — Encounter: Payer: Self-pay | Admitting: Pulmonary Disease

## 2018-05-23 ENCOUNTER — Ambulatory Visit: Payer: PPO | Admitting: Pulmonary Disease

## 2018-05-23 VITALS — BP 144/84 | HR 80 | Ht 63.0 in | Wt 269.0 lb

## 2018-05-23 DIAGNOSIS — R918 Other nonspecific abnormal finding of lung field: Secondary | ICD-10-CM

## 2018-05-23 DIAGNOSIS — R911 Solitary pulmonary nodule: Secondary | ICD-10-CM

## 2018-05-23 DIAGNOSIS — R042 Hemoptysis: Secondary | ICD-10-CM

## 2018-05-23 NOTE — Progress Notes (Signed)
         Gregory Malone    [id number]    05/31/1951  Primary Care Physician:Vincent, [place] Hun, MD  Referring Physician: Eustaquio Maize, MD [place], [place] [id number]  Chief complaint:  Follow up for evaluation of bloody sputum  HPI: 67 year old with history of GERD. Evaluated in May 2018 for spitting up blood. He reports that he brings up blood when he wakes up in the morning. The blood is described as small scabby pieces of reddish material. He does not have issues for the rest of the day usually. However when he lies down and takes a nap for several hours the symptoms recur. He has some soreness at the center of the chest. No cough, palpitations, dyspnea, wheezing, fevers production, fevers, chills.  He had an ENT examination by Dr. Redmond Baseman in April 2018 which did not show any acute abnormality. A GI consult was suggested to evaluate acid reflux but this has not been done yet. He's had a colonoscopy in 2008 which showed diverticula, colon polyps. There is no record of EGD. He has been started on Nexium for the past 2 months by his primary care and this has not improved his symptoms.   He has seen GI and had EGD, colonoscopy done by Dr. [name], GI in June 2018. The results of these are unremarkable with no signs of bleeding identified.   Occupation: Worked in the [employer]. Currently retired Exposures: No significant exposures at work or at home Smoking history: 10 pack year smoker and quit in 1994  Interim History: Has no more episodes of hemoptysis.  States that breathing is doing well He had a recent CT that shows stable lung nodules.  Physical Exam: Blood pressure (!) 144/84, pulse 80, height 5\' 3"  (1.6 m), weight 269 lb (122 kg), SpO2 93 %. Gen:      No acute distress HEENT:  EOMI, sclera anicteric Neck:     No masses; no thyromegaly Lungs:    Clear to auscultation bilaterally; normal respiratory effort CV:         Regular rate and  rhythm; no murmurs Abd:      + bowel sounds; soft, non-tender; no palpable masses, no distension Ext:    No edema; adequate peripheral perfusion Skin:      Warm and dry; no rash Neuro: alert and oriented x 3 Psych: normal mood and affect  Data Reviewed: CT chest 10/19/16 - scattered subcentimeter pulmonary nodules, aortic atherosclerosis, coronary calcification  CT chest 05/15/18-unchanged stable pulmonary nodules, aortic, coronary atherosclerosis.  Enlarged PA  Assessment:  Eval for atypical chest pain, blood in the sputum He has had an extensive workup including ENT, GI eval. CT scan of chest shows only small pulmonary nodules that are unlikely to be the source of the bleed.  Suspect his bleeding may have been from dental work that was done shortly prior to presentation. Continue monitoring.  Sub cm pulm nodules Stable on follow-up scan.  Will get repeat scan next year to ensure 2 year stability   Plan/Recommendations: - CT chest without contrast 1 year  Marshell Garfinkel MD [facility] Pager [phone] 05/23/2018, 3:31 PM  CC: Eustaquio Maize, MD

## 2018-05-23 NOTE — Patient Instructions (Signed)
  CT scan shows stable lung nodules which are likely benign We will get a follow-up scan without contrast in 1 year for evaluation Follow in clinic after scan for evaluation.

## 2018-07-13 ENCOUNTER — Other Ambulatory Visit: Payer: Self-pay | Admitting: Pediatrics

## 2018-07-30 ENCOUNTER — Encounter: Payer: Self-pay | Admitting: Family

## 2018-07-30 ENCOUNTER — Ambulatory Visit (INDEPENDENT_AMBULATORY_CARE_PROVIDER_SITE_OTHER): Payer: PPO | Admitting: Family

## 2018-07-30 ENCOUNTER — Ambulatory Visit: Payer: PPO | Admitting: Pediatrics

## 2018-07-30 VITALS — BP 120/75 | HR 69 | Temp 98.1°F | Ht 63.0 in | Wt 265.8 lb

## 2018-07-30 DIAGNOSIS — K219 Gastro-esophageal reflux disease without esophagitis: Secondary | ICD-10-CM | POA: Diagnosis not present

## 2018-07-30 DIAGNOSIS — R7303 Prediabetes: Secondary | ICD-10-CM | POA: Diagnosis not present

## 2018-07-30 DIAGNOSIS — I10 Essential (primary) hypertension: Secondary | ICD-10-CM

## 2018-07-30 DIAGNOSIS — E785 Hyperlipidemia, unspecified: Secondary | ICD-10-CM

## 2018-07-30 DIAGNOSIS — C61 Malignant neoplasm of prostate: Secondary | ICD-10-CM | POA: Diagnosis not present

## 2018-07-30 LAB — BAYER DCA HB A1C WAIVED: HB A1C: 5.9 % (ref <7.0)

## 2018-07-30 NOTE — Addendum Note (Signed)
Addended by: Evelina Dun A on: 07/30/2018 10:33 AM   Modules accepted: Orders

## 2018-07-30 NOTE — Patient Instructions (Signed)
Health Maintenance After Age 67 After age 67, you are at a higher risk for certain long-term diseases and infections as well as injuries from falls. Falls are a major cause of broken bones and head injuries in people who are older than age 67. Getting regular preventive care can help to keep you healthy and well. Preventive care includes getting regular testing and making lifestyle changes as recommended by your health care provider. Talk with your health care provider about:  Which screenings and tests you should have. A screening is a test that checks for a disease when you have no symptoms.  A diet and exercise plan that is right for you. What should I know about screenings and tests to prevent falls? Screening and testing are the best ways to find a health problem early. Early diagnosis and treatment give you the best chance of managing medical conditions that are common after age 67. Certain conditions and lifestyle choices may make you more likely to have a fall. Your health care provider may recommend:  Regular vision checks. Poor vision and conditions such as cataracts can make you more likely to have a fall. If you wear glasses, make sure to get your prescription updated if your vision changes.  Medicine review. Work with your health care provider to regularly review all of the medicines you are taking, including over-the-counter medicines. Ask your health care provider about any side effects that may make you more likely to have a fall. Tell your health care provider if any medicines that you take make you feel dizzy or sleepy.  Osteoporosis screening. Osteoporosis is a condition that causes the bones to get weaker. This can make the bones weak and cause them to break more easily.  Blood pressure screening. Blood pressure changes and medicines to control blood pressure can make you feel dizzy.  Strength and balance checks. Your health care provider may recommend certain tests to check your  strength and balance while standing, walking, or changing positions.  Foot health exam. Foot pain and numbness, as well as not wearing proper footwear, can make you more likely to have a fall.  Depression screening. You may be more likely to have a fall if you have a fear of falling, feel emotionally low, or feel unable to do activities that you used to do.  Alcohol use screening. Using too much alcohol can affect your balance and may make you more likely to have a fall. What actions can I take to lower my risk of falls? General instructions  Talk with your health care provider about your risks for falling. Tell your health care provider if: ? You fall. Be sure to tell your health care provider about all falls, even ones that seem minor. ? You feel dizzy, sleepy, or off-balance.  Take over-the-counter and prescription medicines only as told by your health care provider. These include any supplements.  Eat a healthy diet and maintain a healthy weight. A healthy diet includes low-fat dairy products, low-fat (lean) meats, and fiber from whole grains, beans, and lots of fruits and vegetables. Home safety  Remove any tripping hazards, such as rugs, cords, and clutter.  Install safety equipment such as grab bars in bathrooms and safety rails on stairs.  Keep rooms and walkways well-lit. Activity   Follow a regular exercise program to stay fit. This will help you maintain your balance. Ask your health care provider what types of exercise are appropriate for you.  If you need a cane or   walker, use it as recommended by your health care provider.  Wear supportive shoes that have nonskid soles. Lifestyle  Do not drink alcohol if your health care provider tells you not to drink.  If you drink alcohol, limit how much you have: ? 0-1 drink a day for women. ? 0-2 drinks a day for men.  Be aware of how much alcohol is in your drink. In the [place] one drink equals one typical bottle of beer (12  oz), one-half glass of wine (5 oz), or one shot of hard liquor (1 oz).  Do not use any products that contain nicotine or tobacco, such as cigarettes and e-cigarettes. If you need help quitting, ask your health care provider. Summary  Having a healthy lifestyle and getting preventive care can help to protect your health and wellness after age 67.  Screening and testing are the best way to find a health problem early and help you avoid having a fall. Early diagnosis and treatment give you the best chance for managing medical conditions that are more common for people who are older than age 67.  Falls are a major cause of broken bones and head injuries in people who are older than age 67. Take precautions to prevent a fall at home.  Work with your health care provider to learn what changes you can make to improve your health and wellness and to prevent falls. This information is not intended to replace advice given to you by your health care provider. Make sure you discuss any questions you have with your health care provider. Document Released: 04/18/2017 Document Revised: 04/18/2017 Document Reviewed: 04/18/2017 Elsevier Interactive Patient Education  2019 [employer]  

## 2018-07-30 NOTE — Progress Notes (Addendum)
Subjective:    Patient ID: Gregory Malone, male    DOB: 1950/08/17, 68 y.o.   MRN: [id number]  Chief Complaint  Patient presents with  . Medical Management of Chronic Issues   Pt presents to the office today for chronic follow up. He was followed by Urologists for Prostate Cancer 2013, he now just managing PSA level.   He is followed by Pulmonologist annually for benign pulmonary nodules.   Hypertension  This is a chronic problem. The current episode started more than 1 year ago. The problem has been resolved since onset. The problem is controlled. Associated symptoms include malaise/fatigue. Pertinent negatives include no blurred vision, headaches, peripheral edema or shortness of breath. Risk factors for coronary artery disease include dyslipidemia, obesity and male gender. The current treatment provides moderate improvement. Hypertensive end-organ damage includes CAD/MI. There is no history of CVA or heart failure.  Gastroesophageal Reflux  He complains of heartburn. He reports no belching or no coughing. This is a chronic problem. The current episode started more than 1 year ago. The problem occurs rarely. The problem has been waxing and waning. He has tried an antacid for the symptoms. The treatment provided moderate relief.  Hyperlipidemia  This is a chronic problem. The current episode started more than 1 year ago. The problem is controlled. Recent lipid tests were reviewed and are normal. Exacerbating diseases include obesity. Pertinent negatives include no shortness of breath. Current antihyperlipidemic treatment includes statins. The current treatment provides moderate improvement of lipids. Risk factors for coronary artery disease include dyslipidemia, male sex, hypertension and a sedentary lifestyle.  Diabetes  He presents for his follow-up diabetic visit. He has type 2 diabetes mellitus. His disease course has been stable. Pertinent negatives for hypoglycemia include no  headaches. Pertinent negatives for diabetes include no blurred vision and no foot paresthesias. Pertinent negatives for diabetic complications include no CVA.      Review of Systems  Constitutional: Positive for malaise/fatigue.  Eyes: Negative for blurred vision.  Respiratory: Negative for cough and shortness of breath.   Gastrointestinal: Positive for heartburn.  Neurological: Negative for headaches.  All other systems reviewed and are negative.      Objective:   Physical Exam Vitals signs reviewed.  Constitutional:      General: He is not in acute distress.    Appearance: He is well-developed. He is obese.  HENT:     Head: Normocephalic.     Right Ear: Tympanic membrane normal.     Left Ear: Tympanic membrane normal.  Eyes:     General:        Right eye: No discharge.        Left eye: No discharge.     Pupils: Pupils are equal, round, and reactive to light.  Neck:     Musculoskeletal: Normal range of motion and neck supple.     Thyroid: No thyromegaly.  Cardiovascular:     Rate and Rhythm: Normal rate and regular rhythm.     Heart sounds: Normal heart sounds. No murmur.  Pulmonary:     Effort: Pulmonary effort is normal. No respiratory distress.     Breath sounds: Normal breath sounds. No wheezing.  Abdominal:     General: Bowel sounds are normal. There is no distension.     Palpations: Abdomen is soft.     Tenderness: There is no abdominal tenderness.  Musculoskeletal: Normal range of motion.        General: No tenderness.  Skin:  General: Skin is warm and dry.     Findings: No erythema or rash.  Neurological:     Mental Status: He is alert and oriented to person, place, and time.     Cranial Nerves: No cranial nerve deficit.     Deep Tendon Reflexes: Reflexes are normal and symmetric.  Psychiatric:        Behavior: Behavior normal.        Thought Content: Thought content normal.        Judgment: Judgment normal.       BP 113/73   Pulse 70   Temp  98.1 F (36.7 C)   Ht '5\' 3"'$  (1.6 m)   Wt 265 lb 12.8 oz (120.6 kg)   BMI 47.08 kg/m      Assessment & Plan:  Gregory Malone comes in today with chief complaint of Medical Management of Chronic Issues   Diagnosis and orders addressed:  1. Essential hypertension - CMP14+EGFR - CBC with Differential/Platelet  2. Gastroesophageal reflux disease, esophagitis presence not specified - CMP14+EGFR - CBC with Differential/Platelet  3. Hyperlipidemia, unspecified hyperlipidemia type - CMP14+EGFR - CBC with Differential/Platelet - Lipid panel  4. Morbid obesity ([place]) - CMP14+EGFR - CBC with Differential/Platelet  5. Prostate cancer ([place]) - CMP14+EGFR - CBC with Differential/Platelet - PSA, total and free   6. Pre-diabetes - Bayer DCA Hb A1c Waived   Labs pending Health Maintenance reviewed Diet and exercise encouraged  Follow up plan: 6 months    Evelina Dun, FNP

## 2018-07-31 LAB — CBC WITH DIFFERENTIAL/PLATELET
BASOS ABS: 0.1 10*3/uL (ref 0.0–0.2)
BASOS: 1 %
EOS (ABSOLUTE): 0.4 10*3/uL (ref 0.0–0.4)
EOS: 4 %
HEMATOCRIT: 44.2 % (ref 37.5–51.0)
HEMOGLOBIN: 15 g/dL (ref 13.0–17.7)
IMMATURE GRANS (ABS): 0 10*3/uL (ref 0.0–0.1)
Immature Granulocytes: 0 %
LYMPHS: 16 %
Lymphocytes Absolute: 1.6 10*3/uL (ref 0.7–3.1)
MCH: 30.5 pg (ref 26.6–33.0)
MCHC: 33.9 g/dL (ref 31.5–35.7)
MCV: 90 fL (ref 79–97)
MONOCYTES: 8 %
Monocytes Absolute: 0.8 10*3/uL (ref 0.1–0.9)
NEUTROS ABS: 7.5 10*3/uL — AB (ref 1.4–7.0)
Neutrophils: 71 %
Platelets: 211 10*3/uL (ref 150–450)
RBC: 4.91 x10E6/uL (ref 4.14–5.80)
RDW: 13.2 % (ref 11.6–15.4)
WBC: 10.4 10*3/uL (ref 3.4–10.8)

## 2018-07-31 LAB — CMP14+EGFR
ALT: 17 IU/L (ref 0–44)
AST: 18 IU/L (ref 0–40)
Albumin/Globulin Ratio: 1.7 (ref 1.2–2.2)
Albumin: 4.1 g/dL (ref 3.8–4.8)
Alkaline Phosphatase: 95 IU/L (ref 39–117)
BUN/Creatinine Ratio: 15 (ref 10–24)
BUN: 21 mg/dL (ref 8–27)
Bilirubin Total: 0.3 mg/dL (ref 0.0–1.2)
CALCIUM: 9.4 mg/dL (ref 8.6–10.2)
CO2: 21 mmol/L (ref 20–29)
CREATININE: 1.44 mg/dL — AB (ref 0.76–1.27)
Chloride: 104 mmol/L (ref 96–106)
GFR, EST AFRICAN AMERICAN: 58 mL/min/{1.73_m2} — AB (ref >59)
GFR, EST NON AFRICAN AMERICAN: 50 mL/min/{1.73_m2} — AB (ref >59)
Globulin, Total: 2.4 g/dL (ref 1.5–4.5)
Sodium: 146 mmol/L — ABNORMAL HIGH (ref 134–144)
Total Protein: 6.5 g/dL (ref 6.0–8.5)

## 2018-07-31 LAB — LIPID PANEL
CHOLESTEROL TOTAL: 108 mg/dL (ref 100–199)
Chol/HDL Ratio: 3.4 ratio (ref 0.0–5.0)
HDL: 32 mg/dL — ABNORMAL LOW (ref >39)
LDL Calculated: 54 mg/dL (ref 0–99)
Triglycerides: 111 mg/dL (ref 0–149)
VLDL CHOLESTEROL CAL: 22 mg/dL (ref 5–40)

## 2018-07-31 LAB — PSA, TOTAL AND FREE
PSA FREE PCT: 15 %
PSA, Free: 0.06 ng/mL
Prostate Specific Ag, Serum: 0.4 ng/mL (ref 0.0–4.0)

## 2018-08-05 ENCOUNTER — Other Ambulatory Visit: Payer: Self-pay | Admitting: Family

## 2018-08-05 ENCOUNTER — Telehealth: Payer: Self-pay | Admitting: Family

## 2018-08-05 DIAGNOSIS — R7989 Other specified abnormal findings of blood chemistry: Secondary | ICD-10-CM

## 2018-08-05 NOTE — Telephone Encounter (Signed)
Aware of results. Recomendations.

## 2018-08-07 ENCOUNTER — Other Ambulatory Visit: Payer: PPO

## 2018-08-07 DIAGNOSIS — R7989 Other specified abnormal findings of blood chemistry: Secondary | ICD-10-CM | POA: Diagnosis not present

## 2018-08-08 ENCOUNTER — Other Ambulatory Visit: Payer: Self-pay | Admitting: Family

## 2018-08-08 DIAGNOSIS — R7989 Other specified abnormal findings of blood chemistry: Secondary | ICD-10-CM

## 2018-08-08 LAB — BMP8+EGFR
BUN / CREAT RATIO: 17 (ref 10–24)
BUN: 24 mg/dL (ref 8–27)
CALCIUM: 9.1 mg/dL (ref 8.6–10.2)
CHLORIDE: 101 mmol/L (ref 96–106)
CO2: 26 mmol/L (ref 20–29)
CREATININE: 1.44 mg/dL — AB (ref 0.76–1.27)
GFR calc non Af Amer: 50 mL/min/{1.73_m2} — ABNORMAL LOW (ref >59)
GFR, EST AFRICAN AMERICAN: 58 mL/min/{1.73_m2} — AB (ref >59)
GLUCOSE: 126 mg/dL — AB (ref 65–99)
Potassium: 4 mmol/L (ref 3.5–5.2)
Sodium: 143 mmol/L (ref 134–144)

## 2018-08-27 ENCOUNTER — Other Ambulatory Visit (HOSPITAL_COMMUNITY): Payer: Self-pay | Admitting: Nephrology

## 2018-08-27 DIAGNOSIS — N183 Chronic kidney disease, stage 3 unspecified: Secondary | ICD-10-CM

## 2018-08-27 DIAGNOSIS — E876 Hypokalemia: Secondary | ICD-10-CM | POA: Insufficient documentation

## 2018-08-27 DIAGNOSIS — N200 Calculus of kidney: Secondary | ICD-10-CM | POA: Insufficient documentation

## 2018-08-27 DIAGNOSIS — I10 Essential (primary) hypertension: Secondary | ICD-10-CM | POA: Diagnosis not present

## 2018-08-27 DIAGNOSIS — J45909 Unspecified asthma, uncomplicated: Secondary | ICD-10-CM | POA: Insufficient documentation

## 2018-08-27 DIAGNOSIS — I2510 Atherosclerotic heart disease of native coronary artery without angina pectoris: Secondary | ICD-10-CM | POA: Insufficient documentation

## 2018-08-29 DIAGNOSIS — R809 Proteinuria, unspecified: Secondary | ICD-10-CM | POA: Diagnosis not present

## 2018-08-29 DIAGNOSIS — I10 Essential (primary) hypertension: Secondary | ICD-10-CM | POA: Diagnosis not present

## 2018-08-29 DIAGNOSIS — Z79899 Other long term (current) drug therapy: Secondary | ICD-10-CM | POA: Diagnosis not present

## 2018-08-29 DIAGNOSIS — E559 Vitamin D deficiency, unspecified: Secondary | ICD-10-CM | POA: Diagnosis not present

## 2018-08-29 DIAGNOSIS — Z1159 Encounter for screening for other viral diseases: Secondary | ICD-10-CM | POA: Diagnosis not present

## 2018-08-29 DIAGNOSIS — D509 Iron deficiency anemia, unspecified: Secondary | ICD-10-CM | POA: Diagnosis not present

## 2018-08-29 DIAGNOSIS — N183 Chronic kidney disease, stage 3 (moderate): Secondary | ICD-10-CM | POA: Diagnosis not present

## 2018-09-11 ENCOUNTER — Other Ambulatory Visit: Payer: Self-pay

## 2018-09-11 ENCOUNTER — Ambulatory Visit (HOSPITAL_COMMUNITY)
Admission: RE | Admit: 2018-09-11 | Discharge: 2018-09-11 | Disposition: A | Discharge status: Home / Self Care | Payer: PPO | Source: Ambulatory Visit | Attending: Nephrology | Admitting: Nephrology

## 2018-09-11 DIAGNOSIS — N183 Chronic kidney disease, stage 3 unspecified: Secondary | ICD-10-CM

## 2018-09-11 DIAGNOSIS — N281 Cyst of kidney, acquired: Secondary | ICD-10-CM | POA: Diagnosis not present

## 2018-09-11 DIAGNOSIS — N200 Calculus of kidney: Secondary | ICD-10-CM | POA: Diagnosis not present

## 2018-09-12 DIAGNOSIS — N200 Calculus of kidney: Secondary | ICD-10-CM | POA: Diagnosis not present

## 2018-09-12 DIAGNOSIS — N182 Chronic kidney disease, stage 2 (mild): Secondary | ICD-10-CM | POA: Diagnosis not present

## 2018-09-12 DIAGNOSIS — I10 Essential (primary) hypertension: Secondary | ICD-10-CM | POA: Diagnosis not present

## 2018-10-13 ENCOUNTER — Other Ambulatory Visit: Payer: Self-pay | Admitting: Pediatrics

## 2018-11-05 ENCOUNTER — Other Ambulatory Visit: Payer: Self-pay | Admitting: Pediatrics

## 2018-11-05 DIAGNOSIS — I10 Essential (primary) hypertension: Secondary | ICD-10-CM

## 2018-11-13 ENCOUNTER — Other Ambulatory Visit: Payer: Self-pay | Admitting: Pediatrics

## 2018-11-13 DIAGNOSIS — E785 Hyperlipidemia, unspecified: Secondary | ICD-10-CM

## 2018-11-20 ENCOUNTER — Telehealth: Payer: Self-pay | Admitting: Family

## 2019-01-06 ENCOUNTER — Other Ambulatory Visit: Payer: Self-pay | Admitting: Family

## 2019-01-07 IMAGING — CT CT CHEST W/O CM
2 of 3 series · 15 of 36 positions shown, 18 images · non-contrast
Comparison: None

CLINICAL DATA: Chest pain for 2 months.

EXAM:
CT CHEST WITHOUT CONTRAST
TECHNIQUE: Multidetector CT imaging of the chest was performed following the
standard protocol without IV contrast.

[Series 2: thorax · axial · 0.76mm/px · z∈[+1340,+1610]mm · 12 of 159 slices shown, 15 images]
[im 12/159  mediastinal]
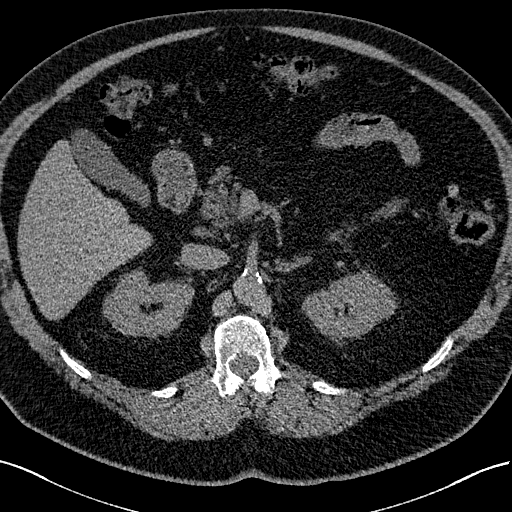
[im 12/159  lung]
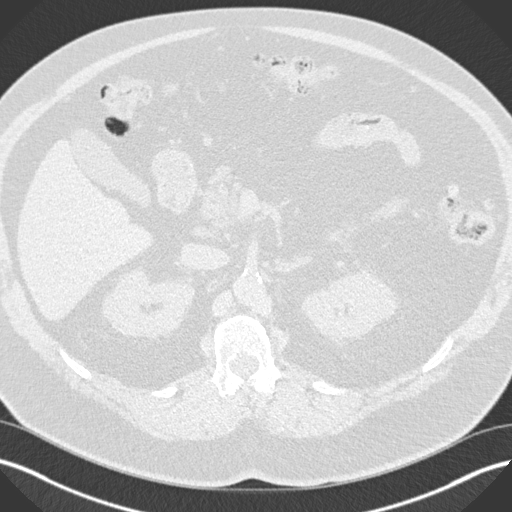
[im 24/159  lung]
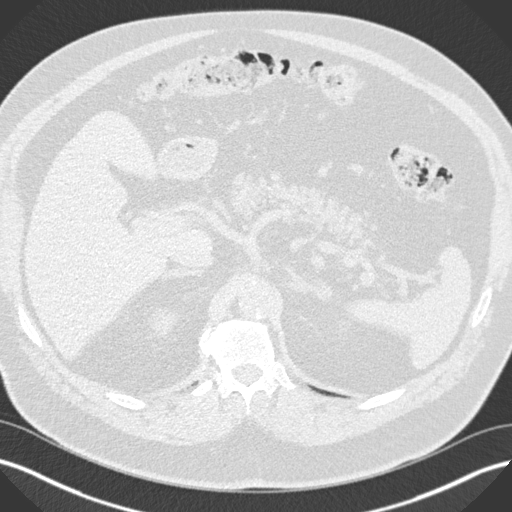
[im 36/159  lung]
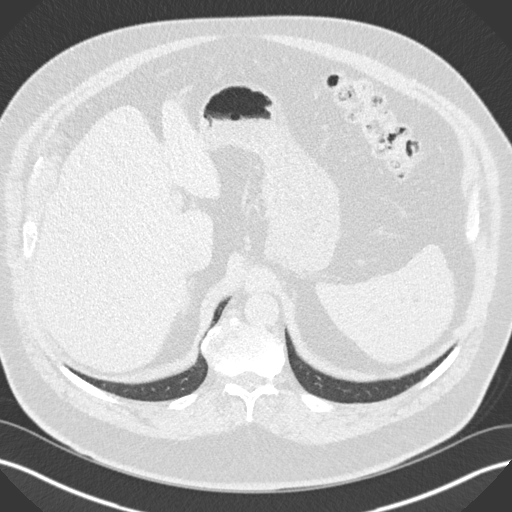
[im 47/159  lung]
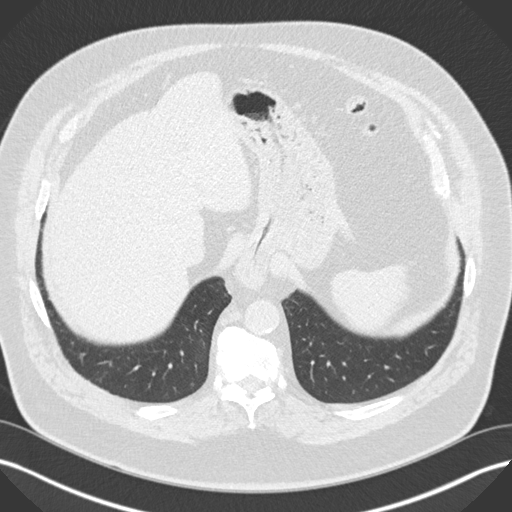
[im 59/159  mediastinal]
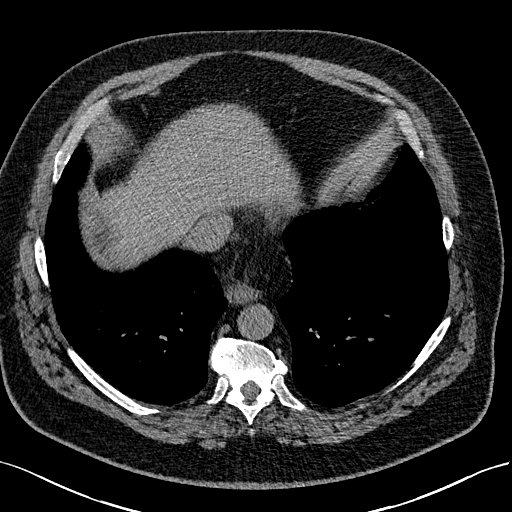
[im 59/159  lung]
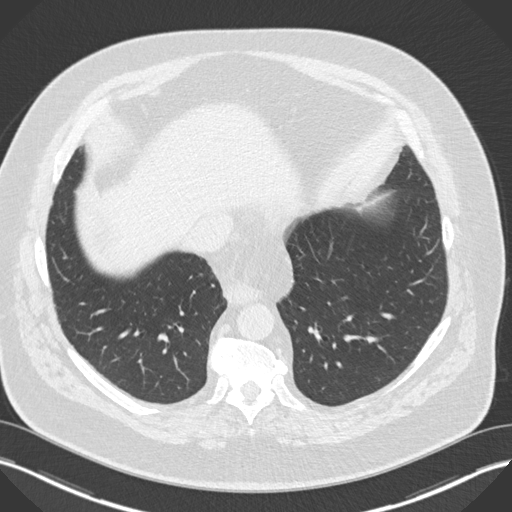
[im 71/159  lung]
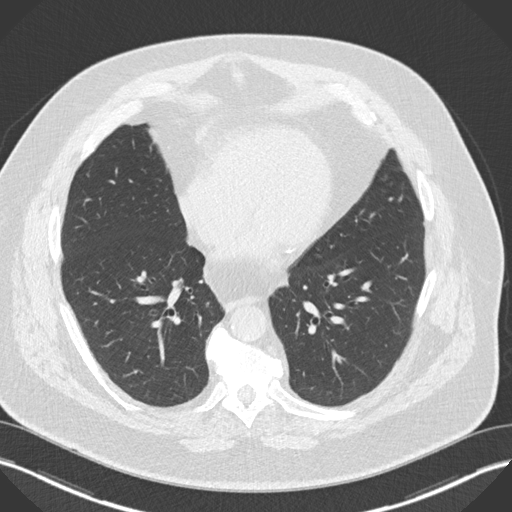
[im 88/159  lung]
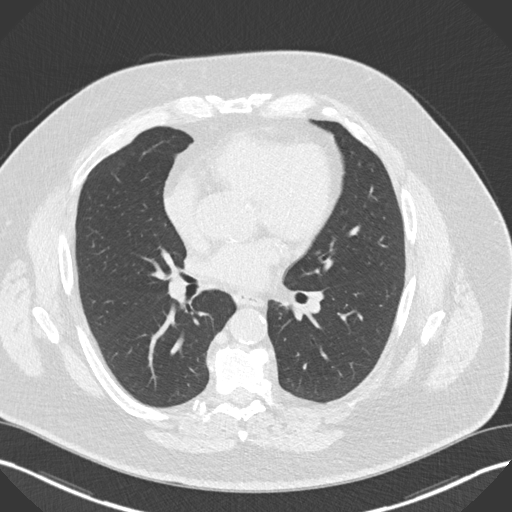
[im 100/159  lung]
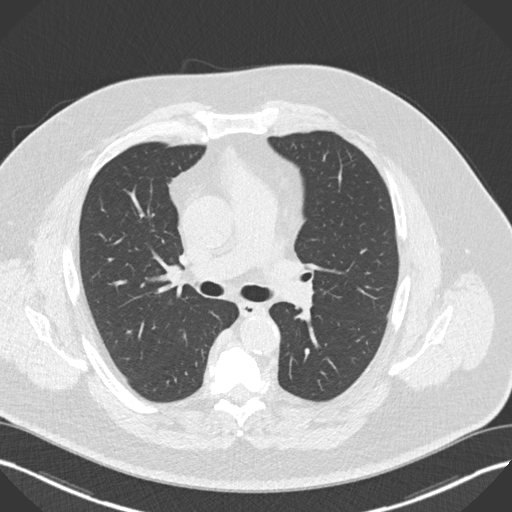
[im 112/159  mediastinal]
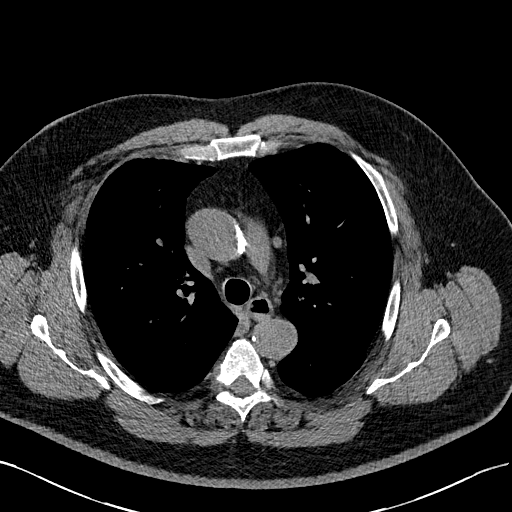
[im 112/159  lung]
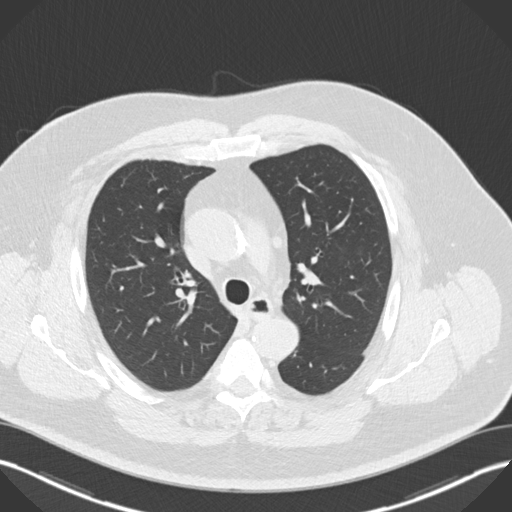
[im 123/159  lung]
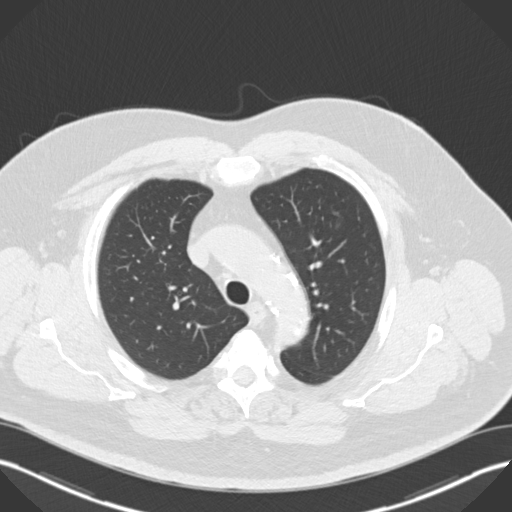
[im 135/159  lung]
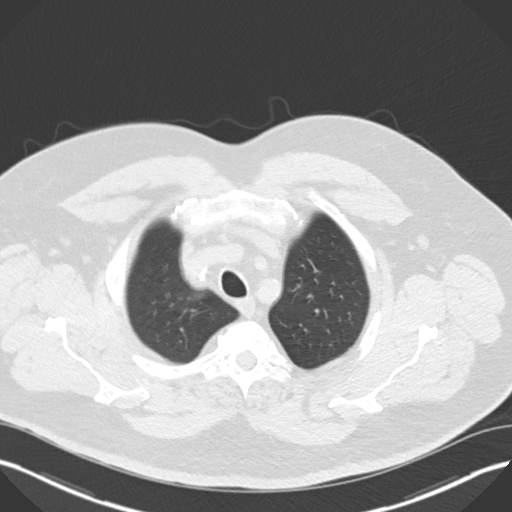
[im 147/159  lung]
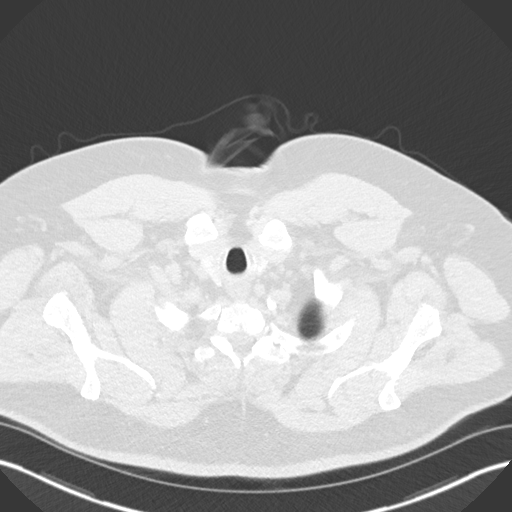

[Series 5: coronal · coronal · 0.63mm/px · 3 of 151 slices shown]
[im 31/151  lung]
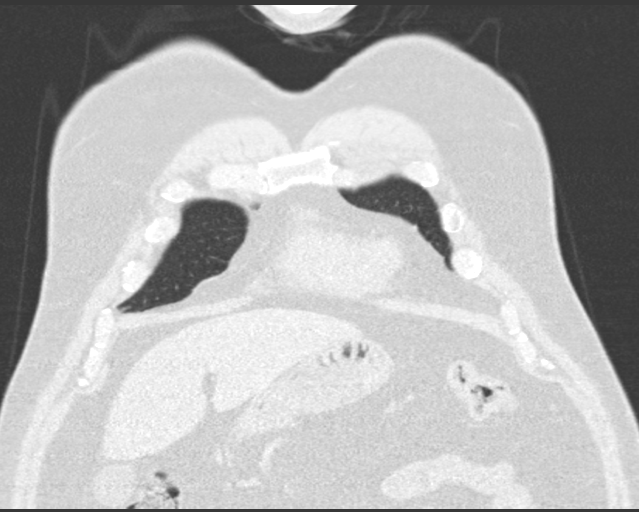
[im 61/151  lung]
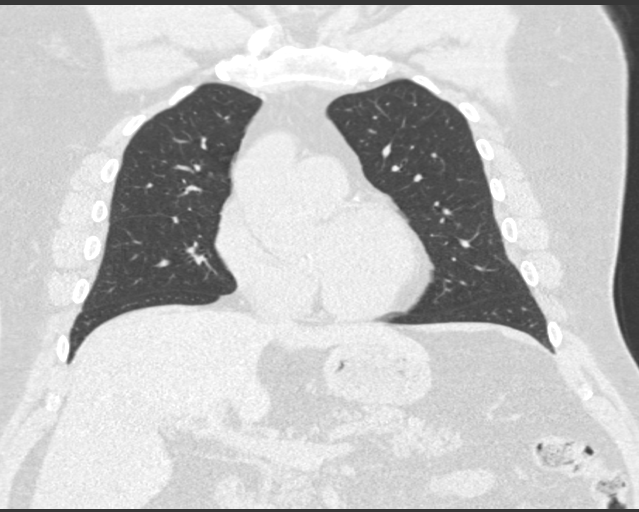
[im 91/151  lung]
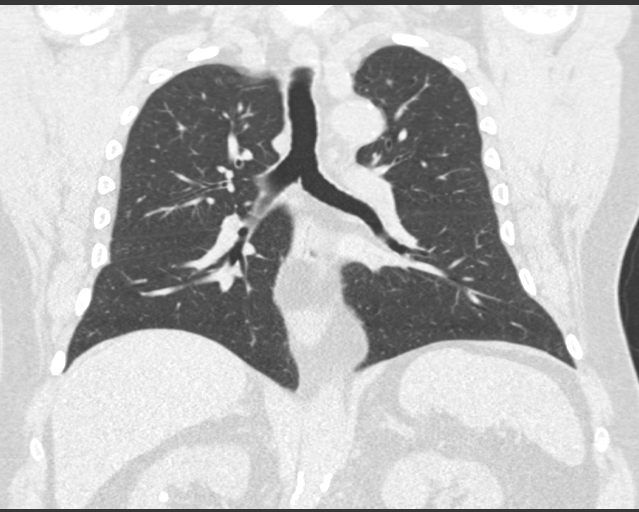

[15 of 36 positions shown; findings below may reference images not displayed]

FINDINGS: Cardiovascular: Normal heart size. Aortic atherosclerosis.
Calcifications within the RCA, LAD coronary artery is noted.

Mediastinum/Nodes: No enlarged mediastinal or axillary lymph nodes.
Thyroid gland, trachea, and esophagus demonstrate no significant
findings.

Lungs/Pleura: No pleural effusion. No airspace opacities or
atelectasis. No pneumothorax identified. Tiny nodule in the anterior
apex of the left lung measures 3 mm, image 19 of series 4. Posterior
right upper lobe lung nodule measures 3 mm, image 32 of series 4.

Upper Abdomen: No acute findings. Nonobstructing calculus is noted
within the upper pole of the right kidney.

Musculoskeletal: Degenerative disc disease identified within the
thoracic spine. No aggressive lytic or sclerotic bone lesions.
IMPRESSION: 1. No acute cardiopulmonary abnormalities.
2. Tiny pulmonary nodules are nonspecific. No follow-up needed if
patient is low-risk (and has no known or suspected primary
neoplasm). Non-contrast chest CT can be considered in 12 months if
patient is high-risk. This recommendation follows the consensus
statement: Guidelines for Management of Incidental Pulmonary Nodules
Detected on CT Images: From the [HOSPITAL] 7599; Radiology
7599; [DATE].
3.  Aortic Atherosclerosis ([id number].C).
4. Multi vessel coronary artery calcification.

## 2019-01-12 ENCOUNTER — Other Ambulatory Visit: Payer: Self-pay | Admitting: Family

## 2019-01-12 DIAGNOSIS — I10 Essential (primary) hypertension: Secondary | ICD-10-CM

## 2019-01-21 DIAGNOSIS — D649 Anemia, unspecified: Secondary | ICD-10-CM | POA: Diagnosis not present

## 2019-01-21 DIAGNOSIS — Z79899 Other long term (current) drug therapy: Secondary | ICD-10-CM | POA: Diagnosis not present

## 2019-01-21 DIAGNOSIS — N183 Chronic kidney disease, stage 3 (moderate): Secondary | ICD-10-CM | POA: Diagnosis not present

## 2019-01-21 DIAGNOSIS — E559 Vitamin D deficiency, unspecified: Secondary | ICD-10-CM | POA: Diagnosis not present

## 2019-01-21 DIAGNOSIS — I10 Essential (primary) hypertension: Secondary | ICD-10-CM | POA: Diagnosis not present

## 2019-01-21 DIAGNOSIS — R809 Proteinuria, unspecified: Secondary | ICD-10-CM | POA: Diagnosis not present

## 2019-01-23 ENCOUNTER — Telehealth: Payer: Self-pay | Admitting: *Deleted

## 2019-01-23 NOTE — Telephone Encounter (Signed)
Called to reschedule patient to a day that was not remote.  He prefers to see you after he comes back from his trip in September.  He has seen nephrologist and had more blood work.  He would like you to review results.  He was told to stop taking ibuprofen.  He will follow up with you in October.

## 2019-01-28 DIAGNOSIS — N183 Chronic kidney disease, stage 3 (moderate): Secondary | ICD-10-CM | POA: Diagnosis not present

## 2019-01-28 DIAGNOSIS — N200 Calculus of kidney: Secondary | ICD-10-CM | POA: Diagnosis not present

## 2019-01-28 DIAGNOSIS — I10 Essential (primary) hypertension: Secondary | ICD-10-CM | POA: Diagnosis not present

## 2019-01-30 ENCOUNTER — Ambulatory Visit: Payer: PPO | Admitting: Family

## 2019-02-05 ENCOUNTER — Other Ambulatory Visit: Payer: Self-pay | Admitting: Family

## 2019-02-05 DIAGNOSIS — E785 Hyperlipidemia, unspecified: Secondary | ICD-10-CM

## 2019-04-01 ENCOUNTER — Other Ambulatory Visit: Payer: Self-pay | Admitting: Family

## 2019-04-16 ENCOUNTER — Other Ambulatory Visit: Payer: Self-pay | Admitting: *Deleted

## 2019-04-16 DIAGNOSIS — I10 Essential (primary) hypertension: Secondary | ICD-10-CM

## 2019-04-16 MED ORDER — LISINOPRIL 40 MG PO TABS: 40.0000 mg | ORAL_TABLET | Freq: Every day | ORAL | 0 refills | Status: DC

## 2019-04-16 MED ORDER — HYDROCHLOROTHIAZIDE 25 MG PO TABS: 25.0000 mg | ORAL_TABLET | Freq: Every day | ORAL | 0 refills | Status: DC

## 2019-04-22 ENCOUNTER — Telehealth: Payer: Self-pay | Admitting: Family

## 2019-04-22 NOTE — Telephone Encounter (Signed)
appt made

## 2019-04-23 ENCOUNTER — Other Ambulatory Visit: Payer: Self-pay

## 2019-04-24 ENCOUNTER — Ambulatory Visit: Payer: PPO | Admitting: Family

## 2019-04-29 ENCOUNTER — Other Ambulatory Visit: Payer: Self-pay

## 2019-04-29 ENCOUNTER — Encounter: Payer: Self-pay | Admitting: Family

## 2019-04-29 ENCOUNTER — Ambulatory Visit (INDEPENDENT_AMBULATORY_CARE_PROVIDER_SITE_OTHER): Payer: PPO | Admitting: Family

## 2019-04-29 VITALS — BP 114/80 | HR 77 | Temp 98.2°F | Ht 63.0 in | Wt 244.0 lb

## 2019-04-29 DIAGNOSIS — C61 Malignant neoplasm of prostate: Secondary | ICD-10-CM | POA: Diagnosis not present

## 2019-04-29 DIAGNOSIS — E785 Hyperlipidemia, unspecified: Secondary | ICD-10-CM | POA: Diagnosis not present

## 2019-04-29 DIAGNOSIS — K219 Gastro-esophageal reflux disease without esophagitis: Secondary | ICD-10-CM

## 2019-04-29 DIAGNOSIS — I10 Essential (primary) hypertension: Secondary | ICD-10-CM

## 2019-04-29 DIAGNOSIS — M5430 Sciatica, unspecified side: Secondary | ICD-10-CM | POA: Diagnosis not present

## 2019-04-29 DIAGNOSIS — N182 Chronic kidney disease, stage 2 (mild): Secondary | ICD-10-CM

## 2019-04-29 DIAGNOSIS — Z23 Encounter for immunization: Secondary | ICD-10-CM

## 2019-04-29 DIAGNOSIS — R7303 Prediabetes: Secondary | ICD-10-CM | POA: Diagnosis not present

## 2019-04-29 LAB — BAYER DCA HB A1C WAIVED: HB A1C (BAYER DCA - WAIVED): 6.3 % (ref <7.0)

## 2019-04-29 MED ORDER — GABAPENTIN 100 MG PO CAPS: 100.0000 mg | ORAL_CAPSULE | Freq: Three times a day (TID) | ORAL | 3 refills | Status: DC

## 2019-04-29 NOTE — Progress Notes (Signed)
Subjective:    Patient ID: Gregory Malone, male    DOB: 12/15/1950, 68 y.o.   MRN: [id number]  Chief Complaint  Patient presents with  . Medical Management of Chronic Issues    needs lab work with PSA  . neuropathy in legs   Pt presents to the office today for chronic follow up. He was followed by Urologists for Prostate Cancer 2013, he is monitoring his PSA level.   He is followed by Pulmonologist annually for benign pulmonary nodules.  He was followed by Nephrologists for CKD stage 2, but his provider has retired.  Hypertension This is a chronic problem. The current episode started more than 1 year ago. The problem has been resolved since onset. The problem is controlled. Pertinent negatives include no malaise/fatigue, peripheral edema or shortness of breath. Risk factors for coronary artery disease include dyslipidemia, obesity, male gender and sedentary lifestyle. The current treatment provides moderate improvement. Hypertensive end-organ damage includes kidney disease. There is no history of CVA or heart failure.  Gastroesophageal Reflux He complains of heartburn. He reports no belching. This is a chronic problem. The problem occurs rarely. The problem has been resolved. Risk factors include obesity. He has tried an antacid for the symptoms. The treatment provided significant relief.  Hyperlipidemia This is a chronic problem. The current episode started more than 1 year ago. The problem is controlled. Recent lipid tests were reviewed and are normal. Exacerbating diseases include obesity. Associated symptoms include leg pain. Pertinent negatives include no shortness of breath. Current antihyperlipidemic treatment includes statins. The current treatment provides moderate improvement of lipids. Risk factors for coronary artery disease include dyslipidemia, male sex and hypertension.  Back Pain This is a new problem. The pain is present in the gluteal. The pain radiates to the left knee  and left thigh. The pain is at a severity of 8/10. The pain is moderate. The symptoms are aggravated by standing, twisting and sitting. Associated symptoms include leg pain, numbness, tingling and weakness. He has tried bed rest for the symptoms. The treatment provided mild relief.      Review of Systems  Constitutional: Negative for malaise/fatigue.  Respiratory: Negative for shortness of breath.   Gastrointestinal: Positive for heartburn.  Musculoskeletal: Positive for back pain.  Neurological: Positive for tingling, weakness and numbness.  All other systems reviewed and are negative.      Objective:   Physical Exam Vitals signs reviewed.  Constitutional:      General: He is not in acute distress.    Appearance: He is well-developed. He is obese.  HENT:     Head: Normocephalic.     Right Ear: External ear normal.     Left Ear: External ear normal.  Eyes:     General:        Right eye: No discharge.        Left eye: No discharge.     Pupils: Pupils are equal, round, and reactive to light.  Neck:     Musculoskeletal: Normal range of motion and neck supple.     Thyroid: No thyromegaly.  Cardiovascular:     Rate and Rhythm: Normal rate and regular rhythm.     Heart sounds: Normal heart sounds. No murmur.  Pulmonary:     Effort: Pulmonary effort is normal. No respiratory distress.     Breath sounds: Normal breath sounds. No wheezing.  Abdominal:     General: Bowel sounds are normal. There is no distension.     Palpations:  Abdomen is soft.     Tenderness: There is no abdominal tenderness.  Musculoskeletal: Normal range of motion.        General: No tenderness.     Comments: Full ROM of left leg, walking with cane  Skin:    General: Skin is warm and dry.     Findings: No erythema or rash.  Neurological:     Mental Status: He is alert and oriented to person, place, and time.     Cranial Nerves: No cranial nerve deficit.     Deep Tendon Reflexes: Reflexes are normal and  symmetric.  Psychiatric:        Behavior: Behavior normal.        Thought Content: Thought content normal.        Judgment: Judgment normal.       BP 114/80   Pulse 77   Temp 98.2 F (36.8 C) (Temporal)   Ht '5\' 3"'$  (1.6 m)   Wt 244 lb (110.7 kg)   SpO2 95%   BMI 43.22 kg/m      Assessment & Plan:  [name] comes in today with chief complaint of Medical Management of Chronic Issues (needs lab work with PSA) and neuropathy in legs   Diagnosis and orders addressed:  1. Essential hypertension - CMP14+EGFR - CBC with Differential/Platelet - Lipid panel - PSA, total and free  2. Gastroesophageal reflux disease, unspecified whether esophagitis present - CMP14+EGFR - CBC with Differential/Platelet  3. Prostate cancer ([place]) - CMP14+EGFR - CBC with Differential/Platelet - PSA, total and free  4. Hyperlipidemia, unspecified hyperlipidemia type - CMP14+EGFR - CBC with Differential/Platelet - Lipid panel  5. Morbid obesity ([place]) - CMP14+EGFR - CBC with Differential/Platelet  6. Chronic kidney disease, stage 2, mildly decreased GFR - CMP14+EGFR - CBC with Differential/Platelet  7. Sciatic leg pain - CMP14+EGFR - CBC with Differential/Platelet - gabapentin (NEURONTIN) 100 MG capsule; Take 1 capsule (100 mg total) by mouth 3 (three) times daily.  Dispense: 90 capsule; Refill: 3  8. Pre-diabetes - hgba1c - Microalbumin / creatinine urine ratio   Labs pending Health Maintenance reviewed Diet and exercise encouraged  Follow up plan: 3 months    Evelina Dun, FNP

## 2019-04-29 NOTE — Patient Instructions (Signed)

## 2019-04-30 LAB — CBC WITH DIFFERENTIAL/PLATELET
Basophils Absolute: 0.1 10*3/uL (ref 0.0–0.2)
Basos: 1 %
EOS (ABSOLUTE): 0.2 10*3/uL (ref 0.0–0.4)
Eos: 2 %
Hematocrit: 44.1 % (ref 37.5–51.0)
Hemoglobin: 14.5 g/dL (ref 13.0–17.7)
Immature Grans (Abs): 0 10*3/uL (ref 0.0–0.1)
Immature Granulocytes: 0 %
Lymphocytes Absolute: 1.5 10*3/uL (ref 0.7–3.1)
Lymphs: 15 %
MCH: 29.2 pg (ref 26.6–33.0)
MCHC: 32.9 g/dL (ref 31.5–35.7)
MCV: 89 fL (ref 79–97)
Monocytes Absolute: 0.7 10*3/uL (ref 0.1–0.9)
Monocytes: 7 %
Neutrophils Absolute: 8 10*3/uL — ABNORMAL HIGH (ref 1.4–7.0)
Neutrophils: 75 %
Platelets: 356 10*3/uL (ref 150–450)
RBC: 4.96 x10E6/uL (ref 4.14–5.80)
RDW: 13.8 % (ref 11.6–15.4)
WBC: 10.5 10*3/uL (ref 3.4–10.8)

## 2019-04-30 LAB — MICROALBUMIN / CREATININE URINE RATIO
Creatinine, Urine: 85.2 mg/dL
Microalb/Creat Ratio: <4 mg/g creat (ref 0–29)
Microalbumin, Urine: <3 ug/mL

## 2019-04-30 LAB — CMP14+EGFR
ALT: 16 IU/L (ref 0–44)
AST: 19 IU/L (ref 0–40)
Albumin/Globulin Ratio: 1.2 (ref 1.2–2.2)
Albumin: 3.8 g/dL (ref 3.8–4.8)
Alkaline Phosphatase: 86 IU/L (ref 39–117)
BUN/Creatinine Ratio: 13 (ref 10–24)
BUN: 18 mg/dL (ref 8–27)
Bilirubin Total: 0.5 mg/dL (ref 0.0–1.2)
CO2: 27 mmol/L (ref 20–29)
Calcium: 9.7 mg/dL (ref 8.6–10.2)
Chloride: 102 mmol/L (ref 96–106)
Creatinine, Ser: 1.4 mg/dL — ABNORMAL HIGH (ref 0.76–1.27)
GFR calc Af Amer: 60 mL/min/{1.73_m2} (ref >59)
GFR calc non Af Amer: 52 mL/min/{1.73_m2} — ABNORMAL LOW (ref >59)
Globulin, Total: 3.2 g/dL (ref 1.5–4.5)
Glucose: 95 mg/dL (ref 65–99)
Potassium: 4.1 mmol/L (ref 3.5–5.2)
Sodium: 146 mmol/L — ABNORMAL HIGH (ref 134–144)
Total Protein: 7 g/dL (ref 6.0–8.5)

## 2019-04-30 LAB — PSA, TOTAL AND FREE
PSA, Free Pct: 10 %
PSA, Free: 0.05 ng/mL
Prostate Specific Ag, Serum: 0.5 ng/mL (ref 0.0–4.0)

## 2019-04-30 LAB — LIPID PANEL
Chol/HDL Ratio: 3.2 ratio (ref 0.0–5.0)
Cholesterol, Total: 105 mg/dL (ref 100–199)
HDL: 33 mg/dL — ABNORMAL LOW (ref >39)
LDL Chol Calc (NIH): 50 mg/dL (ref 0–99)
Triglycerides: 124 mg/dL (ref 0–149)
VLDL Cholesterol Cal: 22 mg/dL (ref 5–40)

## 2019-05-01 ENCOUNTER — Other Ambulatory Visit: Payer: Self-pay

## 2019-05-01 ENCOUNTER — Ambulatory Visit (HOSPITAL_COMMUNITY)
Admission: RE | Admit: 2019-05-01 | Discharge: 2019-05-01 | Disposition: A | Discharge status: Home / Self Care | Payer: PPO | Source: Ambulatory Visit | Attending: Pulmonary Disease | Admitting: Pulmonary Disease

## 2019-05-01 DIAGNOSIS — R911 Solitary pulmonary nodule: Secondary | ICD-10-CM | POA: Insufficient documentation

## 2019-05-01 DIAGNOSIS — F17200 Nicotine dependence, unspecified, uncomplicated: Secondary | ICD-10-CM | POA: Diagnosis not present

## 2019-05-04 ENCOUNTER — Other Ambulatory Visit: Payer: Self-pay | Admitting: Family

## 2019-05-04 DIAGNOSIS — I10 Essential (primary) hypertension: Secondary | ICD-10-CM

## 2019-05-07 ENCOUNTER — Other Ambulatory Visit: Payer: Self-pay

## 2019-05-07 ENCOUNTER — Ambulatory Visit: Payer: PPO | Admitting: Primary Care

## 2019-05-07 ENCOUNTER — Encounter: Payer: Self-pay | Admitting: Primary Care

## 2019-05-07 DIAGNOSIS — R918 Other nonspecific abnormal finding of lung field: Secondary | ICD-10-CM

## 2019-05-07 DIAGNOSIS — R042 Hemoptysis: Secondary | ICD-10-CM | POA: Diagnosis not present

## 2019-05-07 NOTE — Patient Instructions (Addendum)
CT scan showed stable pulmonary nodules, no change in size or shape and considered benign. Incidental finding of moderate right areolar soft tissue favoring right gynecomastia. Small to moderate hiatal hernia, two vessel coronary atherosclerosis and diffuse hepatic steatosis.   Follow-up: Only as needed if blood in sputum returns or you notice a change in your breathing

## 2019-05-07 NOTE — Assessment & Plan Note (Signed)
-   Stability on CT chest since 2018 - No further follow-up needed

## 2019-05-07 NOTE — Progress Notes (Signed)
@Patient  ID: Gregory Malone, male    DOB: 05/06/1951, 68 y.o.   MRN: [id number]  Chief Complaint  Patient presents with  . Pulmonary nodules    Referring provider: Sharion Balloon, FNP  HPI: 68 year old male, former smoker quit in 1994 (10 pack year hx). PMH significant for HTN, GERD, prostate cancer, hemoptysis, pulmonary nodules. Patient of Dr. Vaughan Browner, last seen on 05/23/18.   05/07/2019 Patient presents today for annual follow-up visit for pulmonary nodules . He is doing well, no acute complaints. Reports that his breathing is stable. He has had no further episodes of hemoptysis. Repeat CT chest in November showed small scattered pulmonary nodules, largest 71mm posterior RUL, stable since 2018 and considered benign.    Imaging: CT chest 05/01/19 - tiny scattered pulmonary nodules considered benign. Incidental finding of asymmetric moderate right retroareolar soft tissue favoring right gynecomastia. Small to moderate hiatal hernia, two vessel coronary atherosclerosis and diffuse hepatic steatosis.   CT chest 10/19/16 - scattered subcentimeter pulmonary nodules, aortic atherosclerosis, coronary calcification  CT chest 05/15/18-unchanged stable pulmonary nodules, aortic, coronary atherosclerosis.  Enlarged PA  No Known Allergies  Immunization History  Administered Date(s) Administered  . Pneumococcal Conjugate-13 01/10/2018  . Pneumococcal Polysaccharide-23 04/29/2019  . Tdap 04/22/2018    Past Medical History:  Diagnosis Date  . Asthma   . Bowel obstruction ([place])   . CAD (coronary artery disease)   . Chronic kidney disease    nephrolithiasis  couple episodes 10 years ago  . GERD (gastroesophageal reflux disease)   . Hx of colonic polyp   . Hypercholesterolemia   . Hypertension   . Prostate cancer ([place]) 03/26/12 bx   ,gleason=3+3=6,PSA=4.22,volume=34cc  . Pulmonary nodule   . Radiation 06/27/2012-08/20/2012   prostate 7600 cGy 38 sessions    Tobacco History:  Social History   Tobacco Use  Smoking Status Former Smoker  . Packs/day: 1.00  . Years: 10.00  . Pack years: 10.00  . Types: Cigarettes  . Quit date: 06/19/1992  . Years since quitting: 26.8  Smokeless Tobacco Never Used  Tobacco Comment   quit smoking in 1994   Counseling given: Not Answered Comment: quit smoking in 1994   Outpatient Medications Prior to Visit  Medication Sig Dispense Refill  . acetaminophen (TYLENOL) 500 MG tablet Take 500 mg by mouth every 6 (six) hours as needed.    Marland Kitchen amLODipine (NORVASC) 10 MG tablet TAKE 1 TABLET BY MOUTH EVERY DAY 90 tablet 0  . atorvastatin (LIPITOR) 80 MG tablet Take 1 tablet (80 mg total) by mouth daily. (Please schedule 6 mos appt) 90 tablet 0  . gabapentin (NEURONTIN) 100 MG capsule Take 1 capsule (100 mg total) by mouth 3 (three) times daily. 90 capsule 3  . hydrochlorothiazide (HYDRODIURIL) 25 MG tablet Take 1 tablet (25 mg total) by mouth daily. (Needs to be seen before next refill) 30 tablet 0  . lisinopril (ZESTRIL) 40 MG tablet Take 1 tablet (40 mg total) by mouth daily. (Needs to be seen before next refill) 30 tablet 0  . metoprolol tartrate (LOPRESSOR) 25 MG tablet Take 1 tablet (25 mg total) by mouth 2 (two) times daily. 180 tablet 1  . potassium chloride SA (KLOR-CON M20) 20 MEQ tablet Take 1 tablet (20 mEq total) by mouth daily. (Needs to be seen before next refill) 30 tablet 0   No facility-administered medications prior to visit.    Review of Systems  Review of Systems  Constitutional: Negative.   HENT: Negative.  Respiratory: Negative.   Cardiovascular: Negative.    Physical Exam  BP 130/70 (BP Location: Right Arm, Patient Position: Sitting, Cuff Size: Large)   Pulse (!) 102   Temp 97.6 F (36.4 C)   Ht 5\' 3"  (1.6 m)   Wt 245 lb 9.6 oz (111.4 kg)   SpO2 98% Comment: on room air  BMI 43.51 kg/m  Physical Exam Constitutional:      Appearance: Normal appearance. He is not ill-appearing.  HENT:     Head:  Normocephalic and atraumatic.     Mouth/Throat:     Mouth: Mucous membranes are moist.     Pharynx: Oropharynx is clear.  Cardiovascular:     Rate and Rhythm: Normal rate and regular rhythm.  Pulmonary:     Effort: Pulmonary effort is normal.     Breath sounds: Normal breath sounds.  Musculoskeletal: Normal range of motion.     Comments: Amb with cane, sciatic pain  Skin:    General: Skin is warm and dry.  Neurological:     General: No focal deficit present.     Mental Status: He is alert and oriented to person, place, and time. Mental status is at baseline.  Psychiatric:        Mood and Affect: Mood normal.        Behavior: Behavior normal.        Thought Content: Thought content normal.        Judgment: Judgment normal.      Lab Results:  CBC    Component Value Date/Time   WBC 10.5 04/29/2019 0953   WBC 7.9 04/03/2014 1453   RBC 4.96 04/29/2019 0953   RBC 5.0 04/03/2014 1453   HGB 14.5 04/29/2019 0953   HCT 44.1 04/29/2019 0953   PLT 356 04/29/2019 0953   MCV 89 04/29/2019 0953   MCH 29.2 04/29/2019 0953   MCH 29.1 04/03/2014 1453   MCHC 32.9 04/29/2019 0953   MCHC 32.3 04/03/2014 1453   RDW 13.8 04/29/2019 0953   LYMPHSABS 1.5 04/29/2019 0953   EOSABS 0.2 04/29/2019 0953   BASOSABS 0.1 04/29/2019 0953    BMET    Component Value Date/Time   NA 146 (H) 04/29/2019 0953   K 4.1 04/29/2019 0953   CL 102 04/29/2019 0953   CO2 27 04/29/2019 0953   GLUCOSE 95 04/29/2019 0953   BUN 18 04/29/2019 0953   CREATININE 1.40 (H) 04/29/2019 0953   CALCIUM 9.7 04/29/2019 0953   GFRNONAA 52 (L) 04/29/2019 0953   GFRAA 60 04/29/2019 0953    BNP No results found for: BNP  ProBNP No results found for: PROBNP  Imaging: Ct Chest Wo Contrast  Result Date: 05/01/2019 CLINICAL DATA:  Follow-up pulmonary nodules. Former smoker. History of prostate cancer. EXAM: CT CHEST WITHOUT CONTRAST TECHNIQUE: Multidetector CT imaging of the chest was performed following the  standard protocol without IV contrast. COMPARISON:  05/15/2018 chest CT. FINDINGS: Cardiovascular: Normal heart size. No significant pericardial effusion/thickening. Left anterior descending and left circumflex coronary atherosclerosis. Atherosclerotic nonaneurysmal thoracic aorta. Stable top-normal caliber main pulmonary artery (3.3 cm diameter). Mediastinum/Nodes: No discrete thyroid nodules. Unremarkable esophagus. No pathologically enlarged axillary, mediastinal or hilar lymph nodes, noting limited sensitivity for the detection of hilar adenopathy on this noncontrast study. Lungs/Pleura: No pneumothorax. No pleural effusion. No acute consolidative airspace disease or lung masses. A few tiny scattered solid pulmonary nodules in both lungs, largest 3 mm in the posterior right upper lobe (series 3/image 35), all stable since 10/19/2016  chest CT, considered benign. No new significant pulmonary nodules. Upper abdomen: Small to moderate hiatal hernia. Diffuse hepatic steatosis. Subcentimeter hypodense splenic lesions are all stable since 2018 CT, too small to characterize, considered benign. Musculoskeletal: No aggressive appearing focal osseous lesions. Asymmetric moderate right retroareolar soft tissue is new (series 2/image 43). IMPRESSION: 1. Tiny scattered solid pulmonary nodules are all stable since 10/19/2016 chest CT, considered benign. No active pulmonary disease. 2. Asymmetric moderate right retroareolar soft tissue, new, nonspecific, favor asymmetric moderate right gynecomastia. Correlate with directed clinical exam. 3. Two-vessel coronary atherosclerosis. 4. Small to moderate hiatal hernia. 5. Diffuse hepatic steatosis. Aortic Atherosclerosis (ICD10-I70.0). Electronically Signed   By: Ilona Sorrel M.D.   On: 05/01/2019 13:04     Assessment & Plan:   68 year old male, former smoker. Followed by Dr. Vaughan Browner for history of hemoptysis and pulmonary nodules. Breathing is stable. No active shortness of  breath, wheezing or cough. He has had no further episodes of hemoptysis since last visit. Repeat CT chest showed stable small pulmonary nodules, largest measuring 33mm, considered benign. No additional follow-up recommended. Follow-up as needed only.   Pulmonary nodules - Stability on CT chest since 2018 - No further follow-up needed   Hemoptysis - Resolved, no further episodes   Martyn Ehrich, NP 05/07/2019

## 2019-05-07 NOTE — Assessment & Plan Note (Signed)
-   Resolved, no further episodes 

## 2019-05-09 ENCOUNTER — Other Ambulatory Visit: Payer: Self-pay | Admitting: Family

## 2019-05-09 DIAGNOSIS — I10 Essential (primary) hypertension: Secondary | ICD-10-CM

## 2019-05-13 ENCOUNTER — Ambulatory Visit: Payer: PPO

## 2019-05-14 ENCOUNTER — Other Ambulatory Visit: Payer: Self-pay | Admitting: Family

## 2019-06-02 ENCOUNTER — Ambulatory Visit (INDEPENDENT_AMBULATORY_CARE_PROVIDER_SITE_OTHER): Payer: PPO | Admitting: *Deleted

## 2019-06-02 ENCOUNTER — Other Ambulatory Visit: Payer: Self-pay

## 2019-06-02 DIAGNOSIS — Z0000 Encounter for general adult medical examination without abnormal findings: Secondary | ICD-10-CM | POA: Diagnosis not present

## 2019-06-02 NOTE — Patient Instructions (Signed)

## 2019-06-02 NOTE — Progress Notes (Signed)
MEDICARE ANNUAL WELLNESS VISIT  06/02/2019  Telephone Visit Disclaimer This Medicare AWV was conducted by telephone due to national recommendations for restrictions regarding the COVID-19 Pandemic (e.g. social distancing).  I verified, using two identifiers, that I am speaking with Gregory Malone or their authorized healthcare agent. I discussed the limitations, risks, security, and privacy concerns of performing an evaluation and management service by telephone and the potential availability of an in-person appointment in the future. The patient expressed understanding and agreed to proceed.   Subjective:  Gregory Malone is a 68 y.o. male patient of Hawks, Theador Hawthorne, FNP who had a Medicare Annual Wellness Visit today via telephone. Gregory Malone is Retired and lives alone. he has 0 children. he reports that he is socially active and does interact with friends/family regularly. he is minimally physically active and enjoys relic hunting especially for civil war artifacts, collecting crystals/quartz and photography.  Patient Care Team: Sharion Balloon, FNP as PCP - General (Family Medicine)  Advanced Directives 06/02/2019 01/10/2018 11/28/2017 11/23/2016  Does Patient Have a Medical Advance Directive? Yes No No No  Type of Advance Directive Living will - - -  Does patient want to make changes to medical advance directive? No - Patient declined - - -  Would patient like information on creating a medical advance directive? No - Patient declined No - Patient declined - [facility] Utilization Over the Past 12 Months: # of hospitalizations or ER visits: 0 # of surgeries: 0  Review of Systems    Patient reports that his overall health is unchanged compared to last year.  History obtained from chart review  Patient Reported Readings (BP, Pulse, CBG, Weight, etc) none  Pain Assessment Pain : No/denies pain     Current Medications & Allergies (verified) Allergies as of 06/02/2019    No Known Allergies     Medication List       Accurate as of June 02, 2019  9:55 AM. If you have any questions, ask your nurse or doctor.        acetaminophen 500 MG tablet Commonly known as: TYLENOL Take 500 mg by mouth every 6 (six) hours as needed.   amLODipine 10 MG tablet Commonly known as: NORVASC TAKE 1 TABLET BY MOUTH EVERY DAY   atorvastatin 80 MG tablet Commonly known as: LIPITOR Take 1 tablet (80 mg total) by mouth daily. (Please schedule 6 mos appt)   gabapentin 100 MG capsule Commonly known as: NEURONTIN Take 1 capsule (100 mg total) by mouth 3 (three) times daily.   hydrochlorothiazide 25 MG tablet Commonly known as: HYDRODIURIL TAKE 1 TABLET (25 MG TOTAL) BY MOUTH DAILY. (NEEDS TO BE SEEN BEFORE NEXT REFILL)   lisinopril 40 MG tablet Commonly known as: ZESTRIL TAKE 1 TABLET (40 MG TOTAL) BY MOUTH DAILY. (NEEDS TO BE SEEN BEFORE NEXT REFILL)   metoprolol tartrate 25 MG tablet Commonly known as: LOPRESSOR Take 1 tablet (25 mg total) by mouth 2 (two) times daily.   potassium chloride SA 20 MEQ tablet Commonly known as: Klor-Con M20 Take 1 tablet (20 mEq total) by mouth daily.       History (reviewed): Past Medical History:  Diagnosis Date  . Asthma   . Bowel obstruction ([place])   . CAD (coronary artery disease)   . Chronic kidney disease    nephrolithiasis  couple episodes 10 years ago  . GERD (gastroesophageal reflux disease)   . Hx of colonic polyp   .  Hypercholesterolemia   . Hypertension   . Prostate cancer ([place]) 03/26/12 bx   ,gleason=3+3=6,PSA=4.22,volume=34cc  . Pulmonary nodule   . Radiation 06/27/2012-08/20/2012   prostate 7600 cGy 38 sessions   Past Surgical History:  Procedure Laterality Date  . COLONOSCOPY    . POLYPECTOMY    . PROSTATE BIOPSY  03/26/12   adenocarcinoma  . TONSILLECTOMY    . UPPER GASTROINTESTINAL ENDOSCOPY     Family History  Problem Relation Age of Onset  . Prostate cancer Father 39  . Hypertension  Father   . Hyperlipidemia Father   . Prostate cancer Maternal Grandfather 77  . Prostate cancer Paternal Uncle   . Prostate cancer Cousin   . Hypertension Mother   . Uterine cancer Sister   . Diabetes Sister   . Colon cancer Maternal Aunt   . Colon cancer Cousin        x 2  . Stomach cancer Neg Hx   . Rectal cancer Neg Hx   . Pancreatic cancer Neg Hx   . Esophageal cancer Neg Hx    Social History   Socioeconomic History  . Marital status: Divorced    Spouse name: Not on file  . Number of children: 0  . Years of education: 16  . Highest education level: Bachelor's degree (e.g., BA, AB, BS)  Occupational History  . 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 Smoking status: Former Smoker    Packs/day: 1.00    Years: 10.00    Pack years: 10.00    Types: Cigarettes    Quit date: 06/19/1992    Years since quitting: 26.9  . Smokeless tobacco: Never Used  . Tobacco comment: quit smoking in 1994  Substance and Sexual Activity  . Alcohol use: Yes    Alcohol/week: 2.0 standard drinks    Types: 2 Cans of beer per week  . Drug use: No  . Sexual activity: Yes  Other Topics Concern  . Not on file  Social History Narrative  . Not on file   Social Determinants of Health   Financial Resource Strain: Low Risk   . Difficulty of Paying Living Expenses: Not hard at all  Food Insecurity:   . Worried About Charity fundraiser in the Last Year: Not on file  . Ran Out of Food in the Last Year: Not on file  Transportation Needs: No Transportation Needs  . Lack of Transportation (Medical): No  . Lack of Transportation (Non-Medical): No  Physical Activity: Inactive  . Days of Exercise per Week: 0 days  . Minutes of Exercise per Session: 0 min  Stress: No Stress Concern Present  . Feeling of Stress : Not at all  Social Connections: Somewhat Isolated  . Frequency of Communication with Friends and Family: Three times a week  . Frequency of  Social Gatherings with Friends and Family: More than three times a week  . Attends Religious Services: More than 4 times per year  . Active Member of Clubs or Organizations: No  . Attends Archivist Meetings: Never  . Marital Status: Divorced    Activities of Daily Living In your present state of health, do you have any difficulty performing the following activities: 06/02/2019  Hearing? Y  Comment was tested a few years ago  Vision? N  Comment wears glasses-doesn't remember when his last eye exam was  Difficulty concentrating or making decisions? N  Walking or climbing stairs? N  Dressing or bathing? N  Doing errands, shopping? N  Preparing Food and eating ? N  Using the Toilet? N  In the past six months, have you accidently leaked urine? N  Do you have problems with loss of bowel control? N  Managing your Medications? N  Managing your Finances? N  Housekeeping or managing your Housekeeping? N  Some recent data might be hidden    Patient Education/ Literacy How often do you need to have someone help you when you read instructions, pamphlets, or other written materials from your doctor or pharmacy?: 1 - Never What is the last grade level you completed in school?: Masters Degree  Exercise Current Exercise Habits: The patient does not participate in regular exercise at present, Exercise limited by: Other - see comments(neuropathy)  Diet Patient reports consuming 2 meals a day and 1 snack(s) a day Patient reports that his primary diet is: Regular Patient reports that she does have regular access to food.   Depression Screen PHQ 2/9 Scores 06/02/2019 04/29/2019 07/30/2018 04/22/2018 01/10/2018 12/27/2017 09/26/2017  PHQ - 2 Score 0 0 0 0 0 0 0     Fall Risk Fall Risk  06/02/2019 04/29/2019 07/30/2018 04/22/2018 01/10/2018  Falls in the past year? 0 0 0 0 No     Objective:  Gregory Malone seemed alert and oriented and he participated appropriately during our  telephone visit.  Blood Pressure Weight BMI  BP Readings from Last 3 Encounters:  05/07/19 130/70  04/29/19 114/80  07/30/18 120/75   Wt Readings from Last 3 Encounters:  05/07/19 245 lb 9.6 oz (111.4 kg)  04/29/19 244 lb (110.7 kg)  07/30/18 265 lb 12.8 oz (120.6 kg)   BMI Readings from Last 1 Encounters:  05/07/19 43.51 kg/m    *Unable to obtain current vital signs, weight, and BMI due to telephone visit type  Hearing/Vision  . Gregory Malone did not seem to have difficulty with hearing/understanding during the telephone conversation . Reports that he has not had a formal eye exam by an eye care professional within the past year . Reports that he has not had a formal hearing evaluation within the past year *Unable to fully assess hearing and vision during telephone visit type  Cognitive Function: 6CIT Screen 06/02/2019  What Year? 0 points  What month? 0 points  What time? 0 points  Count back from 20 0 points  Months in reverse 0 points  Repeat phrase 0 points  Total Score 0   (Normal:0-7, Significant for Dysfunction: >8)  Normal Cognitive Function Screening: Yes   Immunization & Health Maintenance Record Immunization History  Administered Date(s) Administered  . Pneumococcal Conjugate-13 01/10/2018  . Pneumococcal Polysaccharide-23 04/29/2019  . Tdap 04/22/2018    Health Maintenance  Topic Date Due  . INFLUENZA VACCINE  09/17/2019 (Originally 01/18/2019)  . COLONOSCOPY  11/28/2020  . TETANUS/TDAP  04/22/2028  . Hepatitis C Screening  Completed  . PNA vac Low Risk Adult  Completed       Assessment  This is a routine wellness examination for Gregory Malone.  Health Maintenance: Due or Overdue There are no preventive care reminders to display for this patient.  Gregory Malone does not need a referral for Community Assistance: Care Management:   no Social Work:    no Prescription Assistance:  no Nutrition/Diabetes Education:  no   Plan:  Personalized  Goals Goals Addressed            This Visit's Progress   .  DIET - INCREASE WATER INTAKE       Try to drink 6-8 glasses of water daily      Personalized Health Maintenance & Screening Recommendations  Influenza vaccine Advanced directives: has an advanced directive - a copy has been provided Shingles vaccine  Lung Cancer Screening Recommended: no (Low Dose CT Chest recommended if Age 49-80 years, 30 pack-year currently smoking OR have quit w/in past 15 years) Hepatitis C Screening recommended: no HIV Screening recommended: no  Advanced Directives: Written information was not prepared per patient's request.  Referrals & Orders No orders of the defined types were placed in this encounter.   Follow-up Plan . Follow-up with Sharion Balloon, FNP as planned . Consider Flu and Shingles vaccines at your next visit with your PCP   I have personally reviewed and noted the following in the patient's chart:   . Medical and social history . Use of alcohol, tobacco or illicit drugs  . Current medications and supplements . Functional ability and status . Nutritional status . Physical activity . Advanced directives . List of other physicians . Hospitalizations, surgeries, and ER visits in previous 12 months . Vitals . Screenings to include cognitive, depression, and falls . Referrals and appointments  In addition, I have reviewed and discussed with Gregory Malone certain preventive protocols, quality metrics, and best practice recommendations. A written personalized care plan for preventive services as well as general preventive health recommendations is available and can be mailed to the patient at his request.      Milas Hock, LPN  [id number]

## 2019-06-03 ENCOUNTER — Other Ambulatory Visit: Payer: Self-pay | Admitting: Family

## 2019-06-03 DIAGNOSIS — I10 Essential (primary) hypertension: Secondary | ICD-10-CM

## 2019-06-03 DIAGNOSIS — E785 Hyperlipidemia, unspecified: Secondary | ICD-10-CM

## 2019-07-11 ENCOUNTER — Telehealth: Payer: Self-pay | Admitting: Family

## 2019-07-11 NOTE — Telephone Encounter (Signed)
Pt called - States that he has several bouts with NEURO nerve pain in buttock area   This was mentioned in NOV 2020 at last OV with C Hawks.  He thinks this is getting worse. meds you gave for nerve pain = no helping  Wants NEUROsurgeon referral

## 2019-07-14 NOTE — Telephone Encounter (Signed)
Patient aware and appt made 

## 2019-07-14 NOTE — Telephone Encounter (Signed)
Please schedule patient visit. It can be a telephone if he prefers.

## 2019-07-15 ENCOUNTER — Other Ambulatory Visit: Payer: PPO

## 2019-07-15 ENCOUNTER — Other Ambulatory Visit: Payer: Self-pay

## 2019-07-15 DIAGNOSIS — R809 Proteinuria, unspecified: Secondary | ICD-10-CM | POA: Diagnosis not present

## 2019-07-15 DIAGNOSIS — N1831 Chronic kidney disease, stage 3a: Secondary | ICD-10-CM | POA: Diagnosis not present

## 2019-07-15 DIAGNOSIS — I10 Essential (primary) hypertension: Secondary | ICD-10-CM | POA: Diagnosis not present

## 2019-07-15 DIAGNOSIS — D631 Anemia in chronic kidney disease: Secondary | ICD-10-CM | POA: Diagnosis not present

## 2019-07-15 DIAGNOSIS — E559 Vitamin D deficiency, unspecified: Secondary | ICD-10-CM | POA: Diagnosis not present

## 2019-07-15 DIAGNOSIS — Z79899 Other long term (current) drug therapy: Secondary | ICD-10-CM | POA: Diagnosis not present

## 2019-07-16 DIAGNOSIS — N200 Calculus of kidney: Secondary | ICD-10-CM | POA: Diagnosis not present

## 2019-07-16 DIAGNOSIS — E559 Vitamin D deficiency, unspecified: Secondary | ICD-10-CM | POA: Diagnosis not present

## 2019-07-16 DIAGNOSIS — I129 Hypertensive chronic kidney disease with stage 1 through stage 4 chronic kidney disease, or unspecified chronic kidney disease: Secondary | ICD-10-CM | POA: Diagnosis not present

## 2019-07-16 DIAGNOSIS — N1831 Chronic kidney disease, stage 3a: Secondary | ICD-10-CM | POA: Diagnosis not present

## 2019-07-17 ENCOUNTER — Ambulatory Visit (INDEPENDENT_AMBULATORY_CARE_PROVIDER_SITE_OTHER): Payer: PPO | Admitting: Family

## 2019-07-17 ENCOUNTER — Encounter: Payer: Self-pay | Admitting: Family

## 2019-07-17 DIAGNOSIS — G8929 Other chronic pain: Secondary | ICD-10-CM

## 2019-07-17 DIAGNOSIS — M5431 Sciatica, right side: Secondary | ICD-10-CM | POA: Diagnosis not present

## 2019-07-17 DIAGNOSIS — M5441 Lumbago with sciatica, right side: Secondary | ICD-10-CM

## 2019-07-17 MED ORDER — GABAPENTIN 300 MG PO CAPS: 300.0000 mg | ORAL_CAPSULE | Freq: Three times a day (TID) | ORAL | 3 refills | Status: DC

## 2019-07-17 NOTE — Progress Notes (Signed)
Virtual Visit via telephone Note Due to COVID-19 pandemic this visit was conducted virtually. This visit type was conducted due to national recommendations for restrictions regarding the COVID-19 Pandemic (e.g. social distancing, sheltering in place) in an effort to limit this patient's exposure and mitigate transmission in our community. All issues noted in this document were discussed and addressed.  A physical exam was not performed with this format.  I connected with Gregory Malone on 07/17/19 at 10:45 AM  by telephone and verified that I am speaking with the correct person using two identifiers. Gregory Malone is currently located at home and no one is currently with him during visit. The provider, Evelina Dun, FNP is located in their office at time of visit.  I discussed the limitations, risks, security and privacy concerns of performing an evaluation and management service by telephone and the availability of in person appointments. I also discussed with the patient that there may be a patient responsible charge related to this service. The patient expressed understanding and agreed to proceed.   History and Present Illness:  Pt calls the office today with recurrent sciatic pain that has flared up over the last 8 days. He reports he went hiking and may have over done it. He states this has been going on and off over the last year. He states when he has these flare ups he has to walk with a walker and can barely get out of the bed.  Back Pain This is a recurrent problem. The current episode started 1 to 4 weeks ago. The problem occurs constantly. The problem has been gradually improving since onset. The pain is present in the gluteal. The quality of the pain is described as aching and burning. The pain radiates to the right thigh. The pain is at a severity of 9/10. The pain is moderate. The symptoms are aggravated by standing and sitting. Associated symptoms include leg pain,  numbness, tingling and weakness. Risk factors include obesity. He has tried home exercises and NSAIDs (gabapentin, tylenol) for the symptoms. The treatment provided mild relief.      Review of Systems  Musculoskeletal: Positive for back pain.  Neurological: Positive for tingling, weakness and numbness.  All other systems reviewed and are negative.    Observations/Objective: No SOB or distress noted  Assessment and Plan: Gregory Malone comes in today with chief complaint of No chief complaint on file.   Diagnosis and orders addressed:  1. Sciatica of right side - Ambulatory referral to Physical Therapy - MR Lumbar Spine Wo Contrast; Future - gabapentin (NEURONTIN) 300 MG capsule; Take 1 capsule (300 mg total) by mouth 3 (three) times daily.  Dispense: 90 capsule; Refill: 3  2. Chronic right-sided low back pain with right-sided sciatica - Ambulatory referral to Physical Therapy - MR Lumbar Spine Wo Contrast; Future - gabapentin (NEURONTIN) 300 MG capsule; Take 1 capsule (300 mg total) by mouth 3 (three) times daily.  Dispense: 90 capsule; Refill: 3   Rest ROM exercises encouraged NSAID's as needed Will increase Gabapentin to 300 mg from 100 mg  Call if symptoms worsen or do not improve    I discussed the assessment and treatment plan with the patient. The patient was provided an opportunity to ask questions and all were answered. The patient agreed with the plan and demonstrated an understanding of the instructions.   The patient was advised to call back or seek an in-person evaluation if the symptoms worsen or if the condition  fails to improve as anticipated.  The above assessment and management plan was discussed with the patient. The patient verbalized understanding of and has agreed to the management plan. Patient is aware to call the clinic if symptoms persist or worsen. Patient is aware when to return to the clinic for a follow-up visit. Patient educated on when it  is appropriate to go to the emergency department.   Time call ended:  11:08 AM  I provided 23 minutes of non-face-to-face time during this encounter.    Evelina Dun, FNP

## 2019-07-28 ENCOUNTER — Other Ambulatory Visit: Payer: Self-pay | Admitting: Family

## 2019-07-28 DIAGNOSIS — I10 Essential (primary) hypertension: Secondary | ICD-10-CM

## 2019-07-30 ENCOUNTER — Encounter: Payer: Self-pay | Admitting: Family

## 2019-07-30 ENCOUNTER — Other Ambulatory Visit: Payer: Self-pay

## 2019-07-30 ENCOUNTER — Ambulatory Visit (INDEPENDENT_AMBULATORY_CARE_PROVIDER_SITE_OTHER): Payer: PPO | Admitting: Family

## 2019-07-30 ENCOUNTER — Ambulatory Visit (HOSPITAL_COMMUNITY)
Admission: RE | Admit: 2019-07-30 | Discharge: 2019-07-30 | Disposition: A | Discharge status: Home / Self Care | Payer: PPO | Source: Ambulatory Visit | Attending: Family | Admitting: Family

## 2019-07-30 DIAGNOSIS — M545 Low back pain: Secondary | ICD-10-CM | POA: Diagnosis not present

## 2019-07-30 DIAGNOSIS — G8929 Other chronic pain: Secondary | ICD-10-CM | POA: Insufficient documentation

## 2019-07-30 DIAGNOSIS — R918 Other nonspecific abnormal finding of lung field: Secondary | ICD-10-CM

## 2019-07-30 DIAGNOSIS — M5431 Sciatica, right side: Secondary | ICD-10-CM | POA: Insufficient documentation

## 2019-07-30 DIAGNOSIS — D23111 Other benign neoplasm of skin of right upper eyelid, including canthus: Secondary | ICD-10-CM

## 2019-07-30 DIAGNOSIS — I10 Essential (primary) hypertension: Secondary | ICD-10-CM | POA: Diagnosis not present

## 2019-07-30 DIAGNOSIS — M5441 Lumbago with sciatica, right side: Secondary | ICD-10-CM | POA: Insufficient documentation

## 2019-07-30 DIAGNOSIS — K219 Gastro-esophageal reflux disease without esophagitis: Secondary | ICD-10-CM

## 2019-07-30 DIAGNOSIS — Z8546 Personal history of malignant neoplasm of prostate: Secondary | ICD-10-CM | POA: Diagnosis not present

## 2019-07-30 DIAGNOSIS — E785 Hyperlipidemia, unspecified: Secondary | ICD-10-CM

## 2019-07-30 NOTE — Progress Notes (Signed)
Virtual Visit via telephone Note Due to COVID-19 pandemic this visit was conducted virtually. This visit type was conducted due to national recommendations for restrictions regarding the COVID-19 Pandemic (e.g. social distancing, sheltering in place) in an effort to limit this patient's exposure and mitigate transmission in our community. All issues noted in this document were discussed and addressed.  A physical exam was not performed with this format.  I connected with Gregory Malone on 07/30/19 at 10:43 AM by telephone and verified that I am speaking with the correct person using two identifiers. Gregory Malone is currently located at home and no one is currently with him  during visit. The provider, Evelina Dun, FNP is located in their office at time of visit.  I discussed the limitations, risks, security and privacy concerns of performing an evaluation and management service by telephone and the availability of in person appointments. I also discussed with the patient that there may be a patient responsible charge related to this service. The patient expressed understanding and agreed to proceed.   History and Present Illness:  Pt presents to the office today for chronic follow up. He has hx of Prostate Cancer 2013.   He has benign pulmonary nodules that are stable, last Ct scan .He was 04/2019. He followed by Nephrologists for CKD stage 2.  He has chronic back pain and scheduled for MRI today.   He is complaining of skin cyst on his right upper eye lid that he noticed about a week ago. He reports it is bothering his vision because he can see it.  Hypertension This is a chronic problem. The current episode started more than 1 year ago. The problem has been resolved since onset. The problem is controlled. Pertinent negatives include no blurred vision, malaise/fatigue, peripheral edema or shortness of breath. Risk factors for coronary artery disease include obesity, male gender  and sedentary lifestyle. The current treatment provides moderate improvement.  Gastroesophageal Reflux He complains of belching and heartburn. This is a chronic problem. The current episode started more than 1 year ago. The problem occurs occasionally. The problem has been waxing and waning. The symptoms are aggravated by certain foods. Risk factors include obesity. He has tried a PPI for the symptoms. The treatment provided moderate relief.  Hyperlipidemia This is a chronic problem. The current episode started more than 1 year ago. The problem is controlled. Recent lipid tests were reviewed and are normal. Associated symptoms include leg pain. Pertinent negatives include no shortness of breath. Current antihyperlipidemic treatment includes statins. The current treatment provides moderate improvement of lipids. Risk factors for coronary artery disease include dyslipidemia, male sex and hypertension.  Back Pain This is a chronic problem. The current episode started more than 1 year ago. The problem occurs intermittently. The problem has been waxing and waning since onset. The pain is present in the gluteal. The quality of the pain is described as aching. The pain is at a severity of 4/10. The pain is moderate. Associated symptoms include leg pain. He has tried analgesics for the symptoms. The treatment provided mild relief.  Diabetes He presents for his follow-up diabetic visit. He has type 2 diabetes mellitus. His disease course has been stable. There are no hypoglycemic associated symptoms. Pertinent negatives for diabetes include no blurred vision and no foot paresthesias. Symptoms are stable. Risk factors for coronary artery disease include dyslipidemia, diabetes mellitus, male sex and hypertension. (Does not check BS at home)      Review of  Systems  Constitutional: Negative for malaise/fatigue.  Eyes: Negative for blurred vision.  Respiratory: Negative for shortness of breath.     Gastrointestinal: Positive for heartburn.  Musculoskeletal: Positive for back pain.     Observations/Objective: No SOB or distress noted   Assessment and Plan: PARESH TUTWILER comes in today with chief complaint of No chief complaint on file.   Diagnosis and orders addressed:  1. Essential hypertension  2. Gastroesophageal reflux disease, unspecified whether esophagitis present  3. Morbid obesity ([name])  4. Hyperlipidemia, unspecified hyperlipidemia type  5. History of prostate cancer  6. Pulmonary nodules  7. Other benign neoplasm of skin of right upper eyelid, including canthus - Ambulatory referral to Dermatology   Labs reviewed from last visit, will hold off until next visit  Health Maintenance reviewed Diet and exercise encouraged  Follow up plan: 6 months      I discussed the assessment and treatment plan with the patient. The patient was provided an opportunity to ask questions and all were answered. The patient agreed with the plan and demonstrated an understanding of the instructions.   The patient was advised to call back or seek an in-person evaluation if the symptoms worsen or if the condition fails to improve as anticipated.  The above assessment and management plan was discussed with the patient. The patient verbalized understanding of and has agreed to the management plan. Patient is aware to call the clinic if symptoms persist or worsen. Patient is aware when to return to the clinic for a follow-up visit. Patient educated on when it is appropriate to go to the emergency department.   Time call ended:  11:06 AM  I provided 23 minutes of non-face-to-face time during this encounter.    Evelina Dun, FNP

## 2019-07-31 ENCOUNTER — Other Ambulatory Visit: Payer: Self-pay | Admitting: Family

## 2019-07-31 DIAGNOSIS — G549 Nerve root and plexus disorder, unspecified: Secondary | ICD-10-CM

## 2019-07-31 DIAGNOSIS — M5136 Other intervertebral disc degeneration, lumbar region: Secondary | ICD-10-CM

## 2019-07-31 DIAGNOSIS — M5126 Other intervertebral disc displacement, lumbar region: Secondary | ICD-10-CM

## 2019-08-04 DIAGNOSIS — Z6841 Body Mass Index (BMI) 40.0 and over, adult: Secondary | ICD-10-CM | POA: Diagnosis not present

## 2019-08-04 DIAGNOSIS — M5136 Other intervertebral disc degeneration, lumbar region: Secondary | ICD-10-CM | POA: Insufficient documentation

## 2019-08-04 DIAGNOSIS — M461 Sacroiliitis, not elsewhere classified: Secondary | ICD-10-CM | POA: Insufficient documentation

## 2019-08-04 DIAGNOSIS — I10 Essential (primary) hypertension: Secondary | ICD-10-CM | POA: Diagnosis not present

## 2019-08-11 ENCOUNTER — Ambulatory Visit: Payer: PPO | Attending: Neurosurgery | Admitting: Physical Therapy

## 2019-08-11 ENCOUNTER — Encounter: Payer: Self-pay | Admitting: Physical Therapy

## 2019-08-11 ENCOUNTER — Other Ambulatory Visit: Payer: Self-pay | Admitting: *Deleted

## 2019-08-11 ENCOUNTER — Other Ambulatory Visit: Payer: Self-pay

## 2019-08-11 DIAGNOSIS — R293 Abnormal posture: Secondary | ICD-10-CM | POA: Insufficient documentation

## 2019-08-11 DIAGNOSIS — M5441 Lumbago with sciatica, right side: Secondary | ICD-10-CM | POA: Insufficient documentation

## 2019-08-11 DIAGNOSIS — I2510 Atherosclerotic heart disease of native coronary artery without angina pectoris: Secondary | ICD-10-CM

## 2019-08-11 DIAGNOSIS — G8929 Other chronic pain: Secondary | ICD-10-CM | POA: Diagnosis not present

## 2019-08-11 MED ORDER — METOPROLOL TARTRATE 25 MG PO TABS: 25.0000 mg | ORAL_TABLET | Freq: Two times a day (BID) | ORAL | 1 refills | Status: DC

## 2019-08-11 NOTE — Therapy (Signed)
[facility] [place], [place], [address] Phone: [phone]   Fax:  [phone]  Physical Therapy Evaluation  Patient Details  Name: Gregory Malone MRN: [id number] Date of Birth: [birth year] Referring Provider (PT): Evelina Dun   Encounter Date: 08/11/2019  PT End of Session - 08/11/19 1622    Visit Number  1    Number of Visits  12    Date for PT Re-Evaluation  09/22/19    Authorization Type  FOTO.    PT Start Time  0149    PT Stop Time  0239    PT Time Calculation (min)  50 min    Activity Tolerance  Patient tolerated treatment well    Behavior During Therapy  Revision [facility] for tasks assessed/performed       Past Medical History:  Diagnosis Date  . Asthma   . Bowel obstruction ([place])   . CAD (coronary artery disease)   . Chronic kidney disease    nephrolithiasis  couple episodes 10 years ago  . GERD (gastroesophageal reflux disease)   . Hx of colonic polyp   . Hypercholesterolemia   . Hypertension   . Prostate cancer ([place]) 03/26/12 bx   ,gleason=3+3=6,PSA=4.22,volume=34cc  . Pulmonary nodule   . Radiation 06/27/2012-08/20/2012   prostate 7600 cGy 38 sessions    Past Surgical History:  Procedure Laterality Date  . COLONOSCOPY    . POLYPECTOMY    . PROSTATE BIOPSY  03/26/12   adenocarcinoma  . TONSILLECTOMY    . UPPER GASTROINTESTINAL ENDOSCOPY      There were no vitals filed for this visit.   Subjective Assessment - 08/11/19 1420    Subjective  COVID-19 screen performed prior to patient entering clinic.  The patient presents to the clinic today with c/o right sided low back pain and pain into right buttock over the last year.  He reports a new medication has helped and even even took a mile hike recently.  He reports intense flare-ups, however, that are rated at 10/10.  At rest today his pain is a 2/10. Bending increases his pain and sitting decreases his pain.  The patient also reports tailbone pain.    Pertinent History  CAD,HTN, prostate CA (treated successfully per patient).    Diagnostic tests  MRI.    Patient Stated Goals  Get out of pain.    Pain Score  2          [facility] PT Assessment - 08/11/19 0001      Assessment   Medical Diagnosis  Chronic right-sided low back pain with right-sided sciatica.    Referring Provider (PT)  Evelina Dun    Onset Date/Surgical Date  --   ~ one year.     Precautions   Precautions  --   Previous prostate CA.     Restrictions   Weight Bearing Restrictions  No      Balance Screen   Has the patient fallen in the past 6 months  No    Has the patient had a decrease in activity level because of a fear of falling?   No    Is the patient reluctant to leave their home because of a fear of falling?   No      Home Environment   Living Environment  Private residence      Prior Function   Level of Independence  Independent      Observation/Other Assessments   Focus on Therapeutic Outcomes (FOTO)   76%  limitation.      Posture/Postural Control   Posture/Postural Control  Postural limitations    Postural Limitations  Decreased lumbar lordosis      Deep Tendon Reflexes   DTR Assessment Site  Patella;Achilles    Patella DTR  1+    Achilles DTR  0      ROM / Strength   AROM / PROM / Strength  AROM;Strength      AROM   Overall AROM Comments  Active lumbar extension= 10 degrees and flexion is decreased by 25%.      Strength   Overall Strength Comments  Normal LE strength.      Palpation   Palpation comment  CC is right buttock pain though he has no significant palpable pain today.      Special Tests   Other special tests  (-) right SLR test.      Transfers   Comments  Needs instruct in log roll technique.      Ambulation/Gait   Gait Comments  Patient walking with a cane today with a limpage-type gait pattern.                Objective measurements completed on examination: See above findings.      [facility] Adult PT  Treatment/Exercise - 08/11/19 0001      Modalities   Modalities  Electrical Stimulation;Moist Heat      Moist Heat Therapy   Number Minutes Moist Heat  20 Minutes    Moist Heat Location  Lumbar Spine      Electrical Stimulation   Electrical Stimulation Location  Right low back.    Electrical Stimulation Action  Pre-mod.    Electrical Stimulation Parameters  80-150 Hz x 20 minutes.    Electrical Stimulation Goals  Pain               PT Short Term Goals - 08/11/19 1704      PT SHORT TERM GOAL #1   Title  STG's=LTG's.        PT Long Term Goals - 08/11/19 1705      PT LONG TERM GOAL #1   Title  Independent with a HEP.    Time  6    Period  Weeks    Status  New      PT LONG TERM GOAL #2   Title  Eliminate right buttock pain.    Time  6    Period  Weeks    Status  New      PT LONG TERM GOAL #3   Title  Perform ADL's with pain not > 3/10.    Time  6    Period  Weeks    Status  New             Plan - 08/11/19 1652    Clinical Impression Statement  The patient presents to O[facility] with c/o right low back with radiation into his right buttock.  The has been ongoing for about a year.  His pain is low today and in fact he recently went for a hike. He does, however, report severe flare-ups at times.  His LE strength is normal and he demonstrates a negative SLR test.  His posture is remarkable for a loss of lordosis.  He is ambulating with a cane and a limpage-type gait pattern.  Patient will benefit from skilled physical therapy intervention to address deficits and pain.    Personal Factors and Comorbidities  Comorbidity 1;Comorbidity 2  Comorbidities  CAD,HTN, prostate CA (treated successfully per patient).    Examination-Activity Limitations  Bend;Locomotion Level;Other    Examination-Participation Restrictions  Other    Stability/Clinical Decision Making  Evolving/Moderate complexity    Clinical Decision Making  Moderate    Rehab Potential  Good    PT  Frequency  2x / week    PT Duration  6 weeks    PT Treatment/Interventions  ADLs/Self Care Home Management;Cryotherapy;Electrical Stimulation;Traction;Moist Heat;Functional mobility training;Therapeutic activities;Therapeutic exercise;Manual techniques;Patient/family education;Passive range of motion;Dry needling;Spinal Manipulations    PT Next Visit Plan  Please educate patient in log roll technique.  Nustep, draw-in progression.  Core exercise progression.  HMP, electrical stimulation.  STW/M    Consulted and Agree with Plan of Care  Patient       Patient will benefit from skilled therapeutic intervention in order to improve the following deficits and impairments:  Pain, Decreased activity tolerance, Decreased range of motion  Visit Diagnosis: Chronic right-sided low back pain with right-sided sciatica - Plan: PT plan of care cert/re-cert  Abnormal posture - Plan: PT plan of care cert/re-cert     Problem List Patient Active Problem List   Diagnosis Date Noted  . History of prostate cancer 07/30/2019  . Pulmonary nodules 05/07/2019  . Gastroesophageal reflux disease 10/26/2016  . Hematemesis without nausea 10/26/2016  . Atypical chest pain 10/26/2016  . Hemoptysis 10/03/2016  . Pre-diabetes 12/27/2015  . Essential hypertension 04/21/2015  . Hyperlipidemia 04/21/2015  . Morbid obesity ([place]) 04/21/2015  . Prostate cancer ([place])     Gregory Malone, [place] MPT 08/11/2019, 5:08 PM  [facility] [address] [place], [place], [address] Phone: [phone]   Fax:  [phone]  Name: Gregory Malone MRN: [id number] Date of Birth: July 02, 1950

## 2019-08-18 ENCOUNTER — Encounter: Payer: Self-pay | Admitting: Physical Therapy

## 2019-08-18 ENCOUNTER — Ambulatory Visit: Payer: PPO | Attending: Neurosurgery | Admitting: Physical Therapy

## 2019-08-18 ENCOUNTER — Other Ambulatory Visit: Payer: Self-pay

## 2019-08-18 DIAGNOSIS — M5441 Lumbago with sciatica, right side: Secondary | ICD-10-CM | POA: Diagnosis not present

## 2019-08-18 DIAGNOSIS — R293 Abnormal posture: Secondary | ICD-10-CM | POA: Diagnosis not present

## 2019-08-18 DIAGNOSIS — G8929 Other chronic pain: Secondary | ICD-10-CM | POA: Diagnosis not present

## 2019-08-18 NOTE — Therapy (Signed)
[facility] [place], [place], [address] Phone: [phone]   Fax:  [phone]  Physical Therapy Treatment  Patient Details  Name: JARAAD FUGITT MRN: [id number] Date of Birth: 01-26-1951 Referring Provider (PT): Evelina Dun   Encounter Date: 08/18/2019  PT End of Session - 08/18/19 1348    Visit Number  2    Number of Visits  12    Date for PT Re-Evaluation  09/22/19    Authorization Type  FOTO.    PT Start Time  1346    PT Stop Time  1432    PT Time Calculation (min)  46 min    Equipment Utilized During Treatment  Other (comment)   FWW   Activity Tolerance  Patient limited by pain    Behavior During Therapy  WFL for tasks assessed/performed       Past Medical History:  Diagnosis Date  . Asthma   . Bowel obstruction ([place])   . CAD (coronary artery disease)   . Chronic kidney disease    nephrolithiasis  couple episodes 10 years ago  . GERD (gastroesophageal reflux disease)   . Hx of colonic polyp   . Hypercholesterolemia   . Hypertension   . Prostate cancer ([place]) 03/26/12 bx   ,gleason=3+3=6,PSA=4.22,volume=34cc  . Pulmonary nodule   . Radiation 06/27/2012-08/20/2012   prostate 7600 cGy 38 sessions    Past Surgical History:  Procedure Laterality Date  . COLONOSCOPY    . POLYPECTOMY    . PROSTATE BIOPSY  03/26/12   adenocarcinoma  . TONSILLECTOMY    . UPPER GASTROINTESTINAL ENDOSCOPY      There were no vitals filed for this visit.  Subjective Assessment - 08/18/19 1347    Subjective  COVID 19 screening performed on patient upon arrival. Patient reports that he flared up his SI joint after going hiking last week across fields. States that pain increases if his body is twisted.    Pertinent History  CAD,HTN, prostate CA (treated successfully per patient).    Diagnostic tests  MRI.    Patient Stated Goals  Get out of pain.    Currently in Pain?  Yes    Pain Score  2     Pain Location  Back    Pain Orientation  Right    Pain Descriptors / Indicators  Discomfort    Pain Type  Acute pain    Pain Radiating Towards  RLE    Pain Onset  More than a month ago    Pain Frequency  Constant         [facility] PT Assessment - 08/18/19 0001      Assessment   Medical Diagnosis  Chronic right-sided low back pain with right-sided sciatica.    Referring Provider (PT)  Evelina Dun      Restrictions   Weight Bearing Restrictions  No                   [facility] Adult PT Treatment/Exercise - 08/18/19 0001      Transfers   Transfers  Supine to Sit;Sit to Supine    Supine to Sit  2: Max assist;1: +1 Total assist;With upper extremity assist    Sit to Supine  4: Min assist;With upper extremity assist      Exercises   Exercises  Lumbar      Lumbar Exercises: Stretches   Passive Hamstring Stretch  Right;3 reps;60 seconds    Single Knee to Chest Stretch  Right;3 reps;60  seconds    Figure 4 Stretch  3 reps;Supine;Without overpressure;60 seconds      Lumbar Exercises: Aerobic   Nustep  L3 x12 min      Modalities   Modalities  Electrical Stimulation;Moist Heat      Moist Heat Therapy   Number Minutes Moist Heat  15 Minutes    Moist Heat Location  Lumbar Spine      Electrical Stimulation   Electrical Stimulation Location  B low back    Electrical Stimulation Action  Pre-Mod    Electrical Stimulation Parameters  80-150 hz x15 min    Electrical Stimulation Goals  Pain               PT Short Term Goals - 08/11/19 1704      PT SHORT TERM GOAL #1   Title  STG's=LTG's.        PT Long Term Goals - 08/11/19 1705      PT LONG TERM GOAL #1   Title  Independent with a HEP.    Time  6    Period  Weeks    Status  New      PT LONG TERM GOAL #2   Title  Eliminate right buttock pain.    Time  6    Period  Weeks    Status  New      PT LONG TERM GOAL #3   Title  Perform ADL's with pain not > 3/10.    Time  6    Period  Weeks    Status  New            Plan  - 08/18/19 1442    Clinical Impression Statement  Patient presented in clinic with FWW and in increased pain secondary to hiking through fields last week. Patient reports if he twists his body then the pain is a twinge. Patient was worse a few days ago. Patient is currently utilizing [place] during ambulation secondary to pain. Patient guided through low level passive low back and hip stretching. Patient very guarded and cautious with any type of movement to avoid exaggerated pain. Patient required max assist with supine > sit transitiional movement. Normal modalities response noted following removal of the modalities.    Personal Factors and Comorbidities  Comorbidity 1;Comorbidity 2    Comorbidities  CAD,HTN, prostate CA (treated successfully per patient).    Examination-Activity Limitations  Bend;Locomotion Level;Other    Examination-Participation Restrictions  Other    Stability/Clinical Decision Making  Evolving/Moderate complexity    Rehab Potential  Good    PT Frequency  2x / week    PT Duration  6 weeks    PT Treatment/Interventions  ADLs/Self Care Home Management;Cryotherapy;Electrical Stimulation;Traction;Moist Heat;Functional mobility training;Therapeutic activities;Therapeutic exercise;Manual techniques;Patient/family education;Passive range of motion;Dry needling;Spinal Manipulations    PT Next Visit Plan  Please educate patient in log roll technique.  Nustep, draw-in progression.  Core exercise progression.  HMP, electrical stimulation.  STW/M    Consulted and Agree with Plan of Care  Patient       Patient will benefit from skilled therapeutic intervention in order to improve the following deficits and impairments:  Pain, Decreased activity tolerance, Decreased range of motion  Visit Diagnosis: Chronic right-sided low back pain with right-sided sciatica  Abnormal posture     Problem List Patient Active Problem List   Diagnosis Date Noted  . History of prostate cancer 07/30/2019   . Pulmonary nodules 05/07/2019  . Gastroesophageal reflux disease 10/26/2016  .  Hematemesis without nausea 10/26/2016  . Atypical chest pain 10/26/2016  . Hemoptysis 10/03/2016  . Pre-diabetes 12/27/2015  . Essential hypertension 04/21/2015  . Hyperlipidemia 04/21/2015  . Morbid obesity ([place]) 04/21/2015  . Prostate cancer [facility])     Standley Brooking, PTA 08/18/2019, 2:57 PM  [facility] [address] [place], [place], [address] Phone: [phone]   Fax:  [phone]  Name: ENGLISH COIT MRN: [id number] Date of Birth: May 17, 1951

## 2019-08-21 DIAGNOSIS — M5136 Other intervertebral disc degeneration, lumbar region: Secondary | ICD-10-CM | POA: Diagnosis not present

## 2019-08-21 DIAGNOSIS — M461 Sacroiliitis, not elsewhere classified: Secondary | ICD-10-CM | POA: Diagnosis not present

## 2019-08-22 ENCOUNTER — Telehealth: Payer: Self-pay | Admitting: Family

## 2019-08-25 ENCOUNTER — Other Ambulatory Visit: Payer: Self-pay

## 2019-08-25 ENCOUNTER — Ambulatory Visit: Payer: PPO | Admitting: *Deleted

## 2019-08-25 ENCOUNTER — Telehealth: Payer: Self-pay | Admitting: Family

## 2019-08-25 DIAGNOSIS — M5441 Lumbago with sciatica, right side: Secondary | ICD-10-CM | POA: Diagnosis not present

## 2019-08-25 DIAGNOSIS — R293 Abnormal posture: Secondary | ICD-10-CM

## 2019-08-25 DIAGNOSIS — G8929 Other chronic pain: Secondary | ICD-10-CM

## 2019-08-25 NOTE — Telephone Encounter (Signed)
Attempted to contact patient - NVM 

## 2019-08-25 NOTE — Telephone Encounter (Signed)
Pt can take gabapentin that may help with pain. Lipitor can cause muscle pain, he should continue this medication.

## 2019-08-25 NOTE — Therapy (Signed)
[facility] [place], [place], [address] Phone: [phone]   Fax:  [phone]  Physical Therapy Treatment  Patient Details  Name: Gregory Malone MRN: [id number] Date of Birth: Sep 25, 1950 Referring Provider (PT): Evelina Dun   Encounter Date: 08/25/2019  PT End of Session - 08/25/19 1351    Visit Number  3    Number of Visits  12    Date for PT Re-Evaluation  09/22/19    Authorization Type  FOTO.    PT Start Time  1345    PT Stop Time  1436    PT Time Calculation (min)  51 min       Past Medical History:  Diagnosis Date  . Asthma   . Bowel obstruction ([place])   . CAD (coronary artery disease)   . Chronic kidney disease    nephrolithiasis  couple episodes 10 years ago  . GERD (gastroesophageal reflux disease)   . Hx of colonic polyp   . Hypercholesterolemia   . Hypertension   . Prostate cancer ([place]) 03/26/12 bx   ,gleason=3+3=6,PSA=4.22,volume=34cc  . Pulmonary nodule   . Radiation 06/27/2012-08/20/2012   prostate 7600 cGy 38 sessions    Past Surgical History:  Procedure Laterality Date  . COLONOSCOPY    . POLYPECTOMY    . PROSTATE BIOPSY  03/26/12   adenocarcinoma  . TONSILLECTOMY    . UPPER GASTROINTESTINAL ENDOSCOPY      There were no vitals filed for this visit.  Subjective Assessment - 08/25/19 1349    Subjective  COVID 19 screening performed on patient upon arrival. Having an injection on the 24th SIJ RT side    Pertinent History  CAD,HTN, prostate CA (treated successfully per patient).    Diagnostic tests  MRI.    Patient Stated Goals  Get out of pain.    Currently in Pain?  Yes    Pain Score  2     Pain Location  Back    Pain Orientation  Right    Pain Descriptors / Indicators  Discomfort    Pain Type  Acute pain    Pain Onset  More than a month ago                       [facility] Adult PT Treatment/Exercise - 08/25/19 0001      Exercises   Exercises  Lumbar      Lumbar Exercises: Aerobic   Nustep  L3 x15 min      Lumbar Exercises: Seated   Other Seated Lumbar Exercises  seated adductor squeeze with ball x10 with 5 secs hold and drawin 2x10 5 sec holds    Other Seated Lumbar Exercises  standing drawin x10 with 5 sec holds      Modalities   Modalities  Electrical Stimulation;Moist Heat      Moist Heat Therapy   Number Minutes Moist Heat  15 Minutes    Moist Heat Location  Lumbar Spine      Electrical Stimulation   Electrical Stimulation Location  B low back    Electrical Stimulation Action  Premod    Electrical Stimulation Parameters  80-150hz  x 15 mins supine    Electrical Stimulation Goals  Pain      Manual Therapy   Manual Therapy  Soft tissue mobilization    Soft tissue mobilization  STW to RT SIJ with Pt LT sidelying  PT Short Term Goals - 08/11/19 1704      PT SHORT TERM GOAL #1   Title  STG's=LTG's.        PT Long Term Goals - 08/11/19 1705      PT LONG TERM GOAL #1   Title  Independent with a HEP.    Time  6    Period  Weeks    Status  New      PT LONG TERM GOAL #2   Title  Eliminate right buttock pain.    Time  6    Period  Weeks    Status  New      PT LONG TERM GOAL #3   Title  Perform ADL's with pain not > 3/10.    Time  6    Period  Weeks    Status  New            Plan - 08/25/19 1352    Clinical Impression Statement  Pt arrived today doing better with decreased pain RT SIJ. He reports not being able to get in the floor to perform exs so seated core activation exs were performed for HEP and did well. Manual STW to RT SIJ with Pt LT sidelying with notable tenderness. Normal modality response today.    Personal Factors and Comorbidities  Comorbidity 1;Comorbidity 2    Comorbidities  CAD,HTN, prostate CA (treated successfully per patient).    Examination-Activity Limitations  Bend;Locomotion Level;Other    Examination-Participation Restrictions  Other    Stability/Clinical  Decision Making  Evolving/Moderate complexity    PT Frequency  2x / week    PT Duration  6 weeks    PT Treatment/Interventions  ADLs/Self Care Home Management;Cryotherapy;Electrical Stimulation;Traction;Moist Heat;Functional mobility training;Therapeutic activities;Therapeutic exercise;Manual techniques;Patient/family education;Passive range of motion;Dry needling;Spinal Manipulations    PT Next Visit Plan  Please educate patient in log roll technique.  Nustep, draw-in progression .  Core exercise progression.  HMP, electrical stimulation.  STW/M       Patient will benefit from skilled therapeutic intervention in order to improve the following deficits and impairments:  Pain, Decreased activity tolerance, Decreased range of motion  Visit Diagnosis: Abnormal posture  Chronic right-sided low back pain with right-sided sciatica     Problem List Patient Active Problem List   Diagnosis Date Noted  . History of prostate cancer 07/30/2019  . Pulmonary nodules 05/07/2019  . Gastroesophageal reflux disease 10/26/2016  . Hematemesis without nausea 10/26/2016  . Atypical chest pain 10/26/2016  . Hemoptysis 10/03/2016  . Pre-diabetes 12/27/2015  . Essential hypertension 04/21/2015  . Hyperlipidemia 04/21/2015  . Morbid obesity ([place]) 04/21/2015  . Prostate cancer [facility])     Liyana Suniga,CHRIS, PTA 08/25/2019, 3:17 PM  [facility] [address] [place], [place], [address] Phone: [phone]   Fax:  [phone]  Name: Gregory Malone MRN: [id number] Date of Birth: 1950/07/19

## 2019-08-25 NOTE — Telephone Encounter (Signed)
No answer, no voicemail.

## 2019-08-26 NOTE — Telephone Encounter (Signed)
PATIENT AWARE

## 2019-08-26 NOTE — Telephone Encounter (Signed)
SPOKE WITH PATIENT

## 2019-08-29 ENCOUNTER — Other Ambulatory Visit: Payer: Self-pay | Admitting: Family

## 2019-08-29 DIAGNOSIS — E785 Hyperlipidemia, unspecified: Secondary | ICD-10-CM

## 2019-09-01 ENCOUNTER — Encounter: Payer: Self-pay | Admitting: Physical Therapy

## 2019-09-01 ENCOUNTER — Other Ambulatory Visit: Payer: Self-pay | Admitting: Family

## 2019-09-01 ENCOUNTER — Other Ambulatory Visit: Payer: Self-pay

## 2019-09-01 ENCOUNTER — Ambulatory Visit: Payer: PPO | Admitting: Physical Therapy

## 2019-09-01 DIAGNOSIS — M5441 Lumbago with sciatica, right side: Secondary | ICD-10-CM | POA: Diagnosis not present

## 2019-09-01 DIAGNOSIS — R293 Abnormal posture: Secondary | ICD-10-CM

## 2019-09-01 DIAGNOSIS — G8929 Other chronic pain: Secondary | ICD-10-CM

## 2019-09-01 NOTE — Therapy (Signed)
[facility] [place], [place], [address] Phone: [phone]   Fax:  [phone]  Physical Therapy Treatment  Patient Details  Name: Gregory Malone MRN: [id number] Date of Birth: 1950-08-11 Referring Provider (PT): Evelina Dun   Encounter Date: 09/01/2019  PT End of Session - 09/01/19 1404    Visit Number  4    Number of Visits  12    Date for PT Re-Evaluation  09/22/19    Authorization Type  FOTO.    PT Start Time  1347    PT Stop Time  1436    PT Time Calculation (min)  49 min    Equipment Utilized During Treatment  Other (comment)   SPC   Activity Tolerance  Patient tolerated treatment well    Behavior During Therapy  WFL for tasks assessed/performed       Past Medical History:  Diagnosis Date  . Asthma   . Bowel obstruction ([place])   . CAD (coronary artery disease)   . Chronic kidney disease    nephrolithiasis  couple episodes 10 years ago  . GERD (gastroesophageal reflux disease)   . Hx of colonic polyp   . Hypercholesterolemia   . Hypertension   . Prostate cancer ([place]) 03/26/12 bx   ,gleason=3+3=6,PSA=4.22,volume=34cc  . Pulmonary nodule   . Radiation 06/27/2012-08/20/2012   prostate 7600 cGy 38 sessions    Past Surgical History:  Procedure Laterality Date  . COLONOSCOPY    . POLYPECTOMY    . PROSTATE BIOPSY  03/26/12   adenocarcinoma  . TONSILLECTOMY    . UPPER GASTROINTESTINAL ENDOSCOPY      There were no vitals filed for this visit.  Subjective Assessment - 09/01/19 1347    Subjective  COVID 19 screening performed on patient upon arrival. Reports going on a hike yesterday and has some soreness.    Pertinent History  CAD,HTN, prostate CA (treated successfully per patient).    Diagnostic tests  MRI.    Patient Stated Goals  Get out of pain.    Currently in Pain?  Yes    Pain Score  2     Pain Location  Back    Pain Orientation  Lower    Pain Descriptors / Indicators  Sore    Pain Type   Acute pain    Pain Onset  More than a month ago    Pain Frequency  Constant         [facility] PT Assessment - 09/01/19 0001      Assessment   Medical Diagnosis  Chronic right-sided low back pain with right-sided sciatica.    Referring Provider (PT)  Evelina Dun      Restrictions   Weight Bearing Restrictions  No                   [facility] Adult PT Treatment/Exercise - 09/01/19 0001      Lumbar Exercises: Aerobic   Nustep  L3 x15 min      Lumbar Exercises: Seated   Long Arc Quad on Chair  AROM;Both;10 reps    LAQ on Chair Limitations  with draw in    Other Seated Lumbar Exercises  Ab set x10 reps 5 sec holds, adductor squeeze x15 reps with draw in      Modalities   Modalities  Electrical Stimulation;Moist Heat      Moist Heat Therapy   Number Minutes Moist Heat  15 Minutes    Moist Heat Location  Lumbar Spine  Theme park manager  B low back    Electrical Stimulation Action  Pre-Mod    Electrical Stimulation Parameters  80-150 hz x15 min    Electrical Stimulation Goals  Pain               PT Short Term Goals - 08/11/19 1704      PT SHORT TERM GOAL #1   Title  STG's=LTG's.        PT Long Term Goals - 08/11/19 1705      PT LONG TERM GOAL #1   Title  Independent with a HEP.    Time  6    Period  Weeks    Status  New      PT LONG TERM GOAL #2   Title  Eliminate right buttock pain.    Time  6    Period  Weeks    Status  New      PT LONG TERM GOAL #3   Title  Perform ADL's with pain not > 3/10.    Time  6    Period  Weeks    Status  New            Plan - 09/01/19 1511    Clinical Impression Statement  Patient presented in clinic with reports of soreness after hiking roughly 1 mile on a dig site. Patient able to tolerate seated low level core activation and patient may require further education regarding correct core technique. Patient educated regarding avoiding bending at his waist and  squatting instead to avoid exaggerated LBP. Normal modalities response noted following removal of the modalities.    Personal Factors and Comorbidities  Comorbidity 1;Comorbidity 2    Comorbidities  CAD,HTN, prostate CA (treated successfully per patient).    Examination-Activity Limitations  Bend;Locomotion Level;Other    Examination-Participation Restrictions  Other    Stability/Clinical Decision Making  Evolving/Moderate complexity    Rehab Potential  Good    PT Frequency  2x / week    PT Duration  6 weeks    PT Treatment/Interventions  ADLs/Self Care Home Management;Cryotherapy;Electrical Stimulation;Traction;Moist Heat;Functional mobility training;Therapeutic activities;Therapeutic exercise;Manual techniques;Patient/family education;Passive range of motion;Dry needling;Spinal Manipulations    PT Next Visit Plan  Please educate patient in log roll technique.  Nustep, draw-in progression .  Core exercise progression.  HMP, electrical stimulation.  STW/M    Consulted and Agree with Plan of Care  Patient       Patient will benefit from skilled therapeutic intervention in order to improve the following deficits and impairments:  Pain, Decreased activity tolerance, Decreased range of motion  Visit Diagnosis: Abnormal posture  Chronic right-sided low back pain with right-sided sciatica     Problem List Patient Active Problem List   Diagnosis Date Noted  . History of prostate cancer 07/30/2019  . Pulmonary nodules 05/07/2019  . Gastroesophageal reflux disease 10/26/2016  . Hematemesis without nausea 10/26/2016  . Atypical chest pain 10/26/2016  . Hemoptysis 10/03/2016  . Pre-diabetes 12/27/2015  . Essential hypertension 04/21/2015  . Hyperlipidemia 04/21/2015  . Morbid obesity ([place]) 04/21/2015  . Prostate cancer [facility])     Standley Brooking, PTA 09/01/2019, 3:53 PM  [facility] [address] [place], [place], [address] Phone:  [phone]   Fax:  [phone]  Name: Gregory Malone MRN: [id number] Date of Birth: 1950/09/22

## 2019-09-02 ENCOUNTER — Telehealth: Payer: Self-pay | Admitting: Family

## 2019-09-02 ENCOUNTER — Encounter: Payer: Self-pay | Admitting: Physician Assistant

## 2019-09-02 ENCOUNTER — Ambulatory Visit (INDEPENDENT_AMBULATORY_CARE_PROVIDER_SITE_OTHER): Payer: PPO | Admitting: Physician Assistant

## 2019-09-02 DIAGNOSIS — D485 Neoplasm of uncertain behavior of skin: Secondary | ICD-10-CM | POA: Diagnosis not present

## 2019-09-02 DIAGNOSIS — L918 Other hypertrophic disorders of the skin: Secondary | ICD-10-CM | POA: Diagnosis not present

## 2019-09-02 DIAGNOSIS — L729 Follicular cyst of the skin and subcutaneous tissue, unspecified: Secondary | ICD-10-CM

## 2019-09-02 DIAGNOSIS — L820 Inflamed seborrheic keratosis: Secondary | ICD-10-CM | POA: Diagnosis not present

## 2019-09-02 NOTE — Chronic Care Management (AMB) (Signed)
  Chronic Care Management   Outreach Note  09/02/2019 Name: [name] MRN: [id number] DOB: Jul 30, 1950  [name] is a 69 y.o. year old male who is a primary care patient of Sharion Balloon, FNP. I reached out to Trude Mcburney by phone today in response to a referral sent by Mr. Sherren Kerns Sak's health plan.     An unsuccessful telephone outreach was attempted today. The patient was referred to the case management team for assistance with care management and care coordination.   Follow Up Plan: The care management team will reach out to the patient again over the next 7 days. If patient returns call to provider office, please advise to call [place] at [phone].  [place], [place] [address] Direct Dial: [phone] [email or web address]: [email or web address]

## 2019-09-02 NOTE — Progress Notes (Addendum)
   New Patient Visit  Subjective  Gregory Malone is a 69 y.o. male who presents for the following: Skin Tag (under right eye x 1 year) and Cyst (right upper eyelid x months). Cyst is on upper eyelid and may need to be lanced. Lesion under right eye is raised and has been there for about 1 year. Patient scratches the lesion on his cheek as it is very irritating.   Objective  Well appearing patient in no apparent distress; mood and affect are within normal limits.  All skin waist up examined. and No suspicious moles noted on back. Waist up skin cancer screening.  Objective  Right Supraorbital Region: Small milia inferior to Right brow.  Right Malar Cheek     Right Supraorbital Region  Objective  Right Supraorbital Region: Fleshy, skin-colored sessile and pedunculated papule on right upper lid.   Assessment & Plan  Cyst of skin Right Supraorbital Region  Extraction of milia inferior to right brow.  Neoplasm of uncertain behavior of skin Right Malar Cheek  Skin / nail biopsy Type of biopsy: tangential   Informed consent: discussed and consent obtained   Timeout: patient name, date of birth, surgical site, and procedure verified   Patient was prepped and draped in usual sterile fashion: Non sterile. Anesthesia: the lesion was anesthetized in a standard fashion   Anesthetic:  1% lidocaine w/ epinephrine 1-100,000 local infiltration Instrument used: flexible razor blade   Hemostasis achieved with: ferric subsulfate   Outcome: patient tolerated procedure well    Specimen 1 - Surgical pathology Differential Diagnosis: rule out scc Check Margins: No  Skin tag Right Supraorbital Region  Snip removal of one small tag right upper eyelid. Area cleaned with alcohol. Tag snipped with sterile scissor. Aluminum chloride to stop bleeding. Ointment applied.  I, Arlyss Gandy, PA-C, have reviewed all documentation for this visit. The documentation on 10/30/19 for the  exam, diagnosis, procedures, and orders are all accurate and complete.

## 2019-09-04 ENCOUNTER — Telehealth: Payer: Self-pay

## 2019-09-04 NOTE — Telephone Encounter (Signed)
Phone call from patient and Pathology results given to patient.

## 2019-09-04 NOTE — Telephone Encounter (Signed)
Phone call to patient regarding pathology results.  Patient doesn't have a voicemail.

## 2019-09-05 ENCOUNTER — Other Ambulatory Visit: Payer: Self-pay | Admitting: Family

## 2019-09-05 DIAGNOSIS — I10 Essential (primary) hypertension: Secondary | ICD-10-CM

## 2019-09-05 NOTE — Chronic Care Management (AMB) (Signed)
  Chronic Care Management   Note  09/05/2019 Name: Gregory Malone MRN: [id number] DOB: [birth year]  Gregory Malone is a 69 y.o. year old male who is a primary care patient of Sharion Balloon, FNP. I reached out to Gregory Malone by phone today in response to a referral sent by [name]'s health plan.     [name] was given information about Chronic Care Management services today including:  1. CCM service includes personalized support from designated clinical staff supervised by his physician, including individualized plan of care and coordination with other care providers 2. 24/7 contact phone numbers for assistance for urgent and routine care needs. 3. Service will only be billed when office clinical staff spend 20 minutes or more in a month to coordinate care. 4. Only one practitioner may furnish and bill the service in a calendar month. 5. The patient may stop CCM services at any time (effective at the end of the month) by phone call to the office staff. 6. The patient will be responsible for cost sharing (co-pay) of up to 20% of the service fee (after annual deductible is met).  Patient did not agree to enrollment in care management services and does not wish to consider at this time.  Follow up plan: The patient has been provided with contact information for the care management team and has been advised to call with any health related questions or concerns.   [place], [place] [address] Direct Dial: [phone] [email or web address] Website: [email or web address]

## 2019-09-08 ENCOUNTER — Ambulatory Visit: Payer: PPO | Admitting: Physical Therapy

## 2019-09-10 DIAGNOSIS — M461 Sacroiliitis, not elsewhere classified: Secondary | ICD-10-CM | POA: Diagnosis not present

## 2019-09-15 ENCOUNTER — Ambulatory Visit: Payer: PPO | Admitting: Physical Therapy

## 2019-09-15 ENCOUNTER — Other Ambulatory Visit: Payer: Self-pay

## 2019-09-15 ENCOUNTER — Encounter: Payer: Self-pay | Admitting: Physical Therapy

## 2019-09-15 DIAGNOSIS — M5441 Lumbago with sciatica, right side: Secondary | ICD-10-CM | POA: Diagnosis not present

## 2019-09-15 DIAGNOSIS — R293 Abnormal posture: Secondary | ICD-10-CM

## 2019-09-15 DIAGNOSIS — G8929 Other chronic pain: Secondary | ICD-10-CM

## 2019-09-15 NOTE — Therapy (Signed)
[facility] [place], [place], [address] Phone: [phone]   Fax:  [phone]  Physical Therapy Treatment  Patient Details  Name: Gregory Malone MRN: [id number] Date of Birth: [birth year] Referring Provider (PT): Evelina Dun   Encounter Date: 09/15/2019  PT End of Session - 09/15/19 1339    Visit Number  5    Number of Visits  12    Date for PT Re-Evaluation  09/22/19    Authorization Type  FOTO.    PT Start Time  1351    PT Stop Time  1441    PT Time Calculation (min)  50 min    Equipment Utilized During Treatment  Other (comment)   SPC   Activity Tolerance  Patient tolerated treatment well    Behavior During Therapy  WFL for tasks assessed/performed       Past Medical History:  Diagnosis Date  . Arthritis   . Asthma   . Bowel obstruction ([place])   . CAD (coronary artery disease)   . Chronic kidney disease    nephrolithiasis  couple episodes 10 years ago  . GERD (gastroesophageal reflux disease)   . Hx of colonic polyp   . Hypercholesterolemia   . Hypertension   . Prostate cancer ([place]) 03/26/12 bx   ,gleason=3+3=6,PSA=4.22,volume=34cc  . Pulmonary nodule   . Radiation 06/27/2012-08/20/2012   prostate 7600 cGy 38 sessions    Past Surgical History:  Procedure Laterality Date  . COLONOSCOPY    . POLYPECTOMY    . PROSTATE BIOPSY  03/26/12   adenocarcinoma  . TONSILLECTOMY    . UPPER GASTROINTESTINAL ENDOSCOPY      There were no vitals filed for this visit.  Subjective Assessment - 09/15/19 1339    Subjective  COVID 19 screening performed on patient upon arrival. Reports that he got a cortizone injection last week and feels about 95% better. Patient reports he no longer has RLE shooting pains but has soreness in posterior L knee.    Pertinent History  CAD,HTN, prostate CA (treated successfully per patient).    Diagnostic tests  MRI.    Patient Stated Goals  Get out of pain.    Currently in Pain?   Yes    Pain Score  1     Pain Location  Back    Pain Orientation  Lower    Pain Descriptors / Indicators  Discomfort    Pain Type  Acute pain    Pain Onset  More than a month ago    Pain Frequency  Intermittent         [facility] PT Assessment - 09/15/19 0001      Assessment   Medical Diagnosis  Chronic right-sided low back pain with right-sided sciatica.    Referring Provider (PT)  Evelina Dun      Restrictions   Weight Bearing Restrictions  No                   [facility] Adult PT Treatment/Exercise - 09/15/19 0001      Lumbar Exercises: Stretches   Passive Hamstring Stretch  Right;Left;3 reps;30 seconds    Figure 4 Stretch  3 reps;20 seconds;Seated;Without overpressure    Figure 4 Stretch Limitations  greater tightness with L hip    Other Lumbar Stretch Exercise  B seated sciatic glides x5 reps each      Lumbar Exercises: Aerobic   Nustep  L2 x16 min      Modalities   Modalities  Electrical Stimulation;Moist Heat      Moist Heat Therapy   Number Minutes Moist Heat  15 Minutes    Moist Heat Location  Lumbar Spine      Electrical Stimulation   Electrical Stimulation Location  B low back    Electrical Stimulation Action  Pre-Mod    Electrical Stimulation Parameters  80-150 hz x15 min    Electrical Stimulation Goals  Pain               PT Short Term Goals - 08/11/19 1704      PT SHORT TERM GOAL #1   Title  STG's=LTG's.        PT Long Term Goals - 08/11/19 1705      PT LONG TERM GOAL #1   Title  Independent with a HEP.    Time  6    Period  Weeks    Status  New      PT LONG TERM GOAL #2   Title  Eliminate right buttock pain.    Time  6    Period  Weeks    Status  New      PT LONG TERM GOAL #3   Title  Perform ADL's with pain not > 3/10.    Time  6    Period  Weeks    Status  New            Plan - 09/15/19 1523    Clinical Impression Statement  Patient presented in clinic after getting an cortisone injection. Patient no  longer experiences sharp pain in RLE but reports discomfort and tenderness as well as HS tightness. Patient guided through seated stretching which reported as a greater stretch in LLE. Normal modalities response noted following removal of the modalities.    Personal Factors and Comorbidities  Comorbidity 1;Comorbidity 2    Comorbidities  CAD,HTN, prostate CA (treated successfully per patient).    Examination-Activity Limitations  Bend;Locomotion Level;Other    Examination-Participation Restrictions  Other    Stability/Clinical Decision Making  Evolving/Moderate complexity    Rehab Potential  Good    PT Frequency  2x / week    PT Duration  6 weeks    PT Treatment/Interventions  ADLs/Self Care Home Management;Cryotherapy;Electrical Stimulation;Traction;Moist Heat;Functional mobility training;Therapeutic activities;Therapeutic exercise;Manual techniques;Patient/family education;Passive range of motion;Dry needling;Spinal Manipulations    PT Next Visit Plan  Please educate patient in log roll technique.  Nustep, draw-in progression .  Core exercise progression.  HMP, electrical stimulation.  STW/M    Consulted and Agree with Plan of Care  Patient       Patient will benefit from skilled therapeutic intervention in order to improve the following deficits and impairments:  Pain, Decreased activity tolerance, Decreased range of motion  Visit Diagnosis: Abnormal posture  Chronic right-sided low back pain with right-sided sciatica     Problem List Patient Active Problem List   Diagnosis Date Noted  . History of prostate cancer 07/30/2019  . Pulmonary nodules 05/07/2019  . Gastroesophageal reflux disease 10/26/2016  . Hematemesis without nausea 10/26/2016  . Atypical chest pain 10/26/2016  . Hemoptysis 10/03/2016  . Pre-diabetes 12/27/2015  . Essential hypertension 04/21/2015  . Hyperlipidemia 04/21/2015  . Morbid obesity ([place]) 04/21/2015  . Prostate cancer [facility])     Standley Brooking,  PTA 09/15/2019, 3:31 PM  [facility] [address] [place], [place], [address] Phone: [phone]   Fax:  [phone]  Name: Gregory Malone MRN: [id number] Date of  Birth: 04-Mar-1951

## 2019-09-22 ENCOUNTER — Encounter: Payer: Self-pay | Admitting: Physical Therapy

## 2019-09-22 ENCOUNTER — Ambulatory Visit: Payer: PPO | Attending: Neurosurgery | Admitting: Physical Therapy

## 2019-09-22 ENCOUNTER — Other Ambulatory Visit: Payer: Self-pay

## 2019-09-22 DIAGNOSIS — R293 Abnormal posture: Secondary | ICD-10-CM | POA: Insufficient documentation

## 2019-09-22 DIAGNOSIS — M5441 Lumbago with sciatica, right side: Secondary | ICD-10-CM | POA: Diagnosis not present

## 2019-09-22 DIAGNOSIS — G8929 Other chronic pain: Secondary | ICD-10-CM | POA: Diagnosis not present

## 2019-09-22 NOTE — Therapy (Signed)
[facility] [place], [place], [address] Phone: [phone]   Fax:  [phone]  Physical Therapy Treatment  Patient Details  Name: Gregory Malone MRN: [id number] Date of Birth: May 05, 1951 Referring Provider (PT): Evelina Dun   Encounter Date: 09/22/2019  PT End of Session - 09/22/19 1357    Visit Number  6    Number of Visits  12    Date for PT Re-Evaluation  09/22/19    Authorization Type  FOTO.    PT Start Time  1353    PT Stop Time  1439    PT Time Calculation (min)  46 min    Activity Tolerance  Patient tolerated treatment well    Behavior During Therapy  WFL for tasks assessed/performed       Past Medical History:  Diagnosis Date  . Arthritis   . Asthma   . Bowel obstruction ([place])   . CAD (coronary artery disease)   . Chronic kidney disease    nephrolithiasis  couple episodes 10 years ago  . GERD (gastroesophageal reflux disease)   . Hx of colonic polyp   . Hypercholesterolemia   . Hypertension   . Prostate cancer ([place]) 03/26/12 bx   ,gleason=3+3=6,PSA=4.22,volume=34cc  . Pulmonary nodule   . Radiation 06/27/2012-08/20/2012   prostate 7600 cGy 38 sessions    Past Surgical History:  Procedure Laterality Date  . COLONOSCOPY    . POLYPECTOMY    . PROSTATE BIOPSY  03/26/12   adenocarcinoma  . TONSILLECTOMY    . UPPER GASTROINTESTINAL ENDOSCOPY      There were no vitals filed for this visit.  Subjective Assessment - 09/22/19 1355    Subjective  COVID 19 screening performed on patient upon arrival. Patient reports that he went hiking over the weekend and reports of sacral soreness.    Pertinent History  CAD,HTN, prostate CA (treated successfully per patient).    Diagnostic tests  MRI.    Patient Stated Goals  Get out of pain.    Currently in Pain?  No/denies         [facility] PT Assessment - 09/22/19 0001      Assessment   Medical Diagnosis  Chronic right-sided low back pain with right-sided  sciatica.    Referring Provider (PT)  Evelina Dun      Restrictions   Weight Bearing Restrictions  No                   [facility] Adult PT Treatment/Exercise - 09/22/19 0001      Lumbar Exercises: Aerobic   Nustep  L4 x15 min      Lumbar Exercises: Machines for Strengthening   Cybex Lumbar Extension  60# x20 reps    Cybex Knee Extension  10# 2x10 reps    Cybex Knee Flexion  30# 2x10 reps      Lumbar Exercises: Seated   Sit to Stand  15 reps   5# handweights BUE     Modalities   Modalities  Electrical Stimulation;Moist Heat      Moist Heat Therapy   Number Minutes Moist Heat  10 Minutes    Moist Heat Location  Lumbar Spine      Electrical Stimulation   Electrical Stimulation Location  B low back    Electrical Stimulation Action  Pre-Mod    Electrical Stimulation Parameters  80-150 hz x10 min    Electrical Stimulation Goals  Pain  PT Short Term Goals - 08/11/19 1704      PT SHORT TERM GOAL #1   Title  STG's=LTG's.        PT Long Term Goals - 09/22/19 1444      PT LONG TERM GOAL #1   Title  Independent with a HEP.    Time  6    Period  Weeks    Status  On-going      PT LONG TERM GOAL #2   Title  Eliminate right buttock pain.    Time  6    Period  Weeks    Status  Achieved      PT LONG TERM GOAL #3   Title  Perform ADL's with pain not > 3/10.    Time  6    Period  Weeks    Status  On-going            Plan - 09/22/19 1444    Clinical Impression Statement  Patient presented in clinic with reports of only sacral soreness after sitting on ground over the weekend. Patient progressed to more standing and machine strengthening. Patient notes weakness when getting up from the ground at dig sites. Patient rquires moderate multimodal cueing for proper exercise technique cues. Normal modalities response noted following removal of the modalities.    Personal Factors and Comorbidities  Comorbidity 1;Comorbidity 2    Comorbidities   CAD,HTN, prostate CA (treated successfully per patient).    Examination-Activity Limitations  Bend;Locomotion Level;Other    Examination-Participation Restrictions  Other    Stability/Clinical Decision Making  Evolving/Moderate complexity    Rehab Potential  Good    PT Frequency  2x / week    PT Duration  6 weeks    PT Treatment/Interventions  ADLs/Self Care Home Management;Cryotherapy;Electrical Stimulation;Traction;Moist Heat;Functional mobility training;Therapeutic activities;Therapeutic exercise;Manual techniques;Patient/family education;Passive range of motion;Dry needling;Spinal Manipulations    PT Next Visit Plan  Please educate patient in log roll technique.  Nustep, draw-in progression .  Core exercise progression.  HMP, electrical stimulation.  STW/M    Consulted and Agree with Plan of Care  Patient       Patient will benefit from skilled therapeutic intervention in order to improve the following deficits and impairments:  Pain, Decreased activity tolerance, Decreased range of motion  Visit Diagnosis: Abnormal posture  Chronic right-sided low back pain with right-sided sciatica     Problem List Patient Active Problem List   Diagnosis Date Noted  . History of prostate cancer 07/30/2019  . Pulmonary nodules 05/07/2019  . Gastroesophageal reflux disease 10/26/2016  . Hematemesis without nausea 10/26/2016  . Atypical chest pain 10/26/2016  . Hemoptysis 10/03/2016  . Pre-diabetes 12/27/2015  . Essential hypertension 04/21/2015  . Hyperlipidemia 04/21/2015  . Morbid obesity ([place]) 04/21/2015  . Prostate cancer [facility])     Standley Brooking, PTA 09/22/2019, 2:49 PM  [facility] [address] [place], [place], [address] Phone: [phone]   Fax:  [phone]  Name: Gregory Malone MRN: [id number] Date of Birth: 08-15-1950

## 2019-09-29 ENCOUNTER — Other Ambulatory Visit: Payer: Self-pay

## 2019-09-29 ENCOUNTER — Encounter: Payer: Self-pay | Admitting: Physical Therapy

## 2019-09-29 ENCOUNTER — Ambulatory Visit: Payer: PPO | Admitting: Physical Therapy

## 2019-09-29 DIAGNOSIS — G8929 Other chronic pain: Secondary | ICD-10-CM

## 2019-09-29 DIAGNOSIS — R293 Abnormal posture: Secondary | ICD-10-CM

## 2019-09-29 NOTE — Therapy (Signed)
[facility] [place], [place], [address] Phone: [phone]   Fax:  [phone]  Physical Therapy Treatment  Patient Details  Name: Gregory Malone MRN: [id number] Date of Birth: 09/12/1950 Referring Provider (PT): Evelina Dun   Encounter Date: 09/29/2019  PT End of Session - 09/29/19 1357    Visit Number  7    Number of Visits  12    Date for PT Re-Evaluation  09/22/19    Authorization Type  FOTO.    PT Start Time  1346    PT Stop Time  1439    PT Time Calculation (min)  53 min    Activity Tolerance  Patient tolerated treatment well    Behavior During Therapy  WFL for tasks assessed/performed       Past Medical History:  Diagnosis Date  . Arthritis   . Asthma   . Bowel obstruction ([place])   . CAD (coronary artery disease)   . Chronic kidney disease    nephrolithiasis  couple episodes 10 years ago  . GERD (gastroesophageal reflux disease)   . Hx of colonic polyp   . Hypercholesterolemia   . Hypertension   . Prostate cancer ([place]) 03/26/12 bx   ,gleason=3+3=6,PSA=4.22,volume=34cc  . Pulmonary nodule   . Radiation 06/27/2012-08/20/2012   prostate 7600 cGy 38 sessions    Past Surgical History:  Procedure Laterality Date  . COLONOSCOPY    . POLYPECTOMY    . PROSTATE BIOPSY  03/26/12   adenocarcinoma  . TONSILLECTOMY    . UPPER GASTROINTESTINAL ENDOSCOPY      There were no vitals filed for this visit.  Subjective Assessment - 09/29/19 1354    Subjective  COVID 19 screening performed on patient upon arrival. Patient has no complaints upon arrival. Was lazy over the weekend per patient reports and he did some things around the house.    Pertinent History  CAD,HTN, prostate CA (treated successfully per patient).    Diagnostic tests  MRI.    Patient Stated Goals  Get out of pain.    Currently in Pain?  No/denies         [facility] PT Assessment - 09/29/19 0001      Assessment   Medical Diagnosis  Chronic  right-sided low back pain with right-sided sciatica.    Referring Provider (PT)  Evelina Dun      Restrictions   Weight Bearing Restrictions  No      Observation/Other Assessments   Focus on Therapeutic Outcomes (FOTO)   33%, CJ                   [facility] Adult PT Treatment/Exercise - 09/29/19 0001      Lumbar Exercises: Aerobic   Nustep  L3 x15 min      Lumbar Exercises: Machines for Strengthening   Cybex Lumbar Extension  60# x30 reps    Cybex Knee Extension  10# 3x10 reps    Cybex Knee Flexion  30# 3x10 reps      Lumbar Exercises: Standing   Row  Strengthening;Both;20 reps;Limitations    Row Limitations  Blue XTS      Modalities   Modalities  Electrical Stimulation;Moist Heat      Moist Heat Therapy   Number Minutes Moist Heat  15 Minutes    Moist Heat Location  Lumbar Spine      Electrical Stimulation   Electrical Stimulation Location  B low back    Electrical Stimulation Action  Pre-Mod  Electrical Stimulation Parameters  80-150 hz x15 min    Electrical Stimulation Goals  Pain               PT Short Term Goals - 08/11/19 1704      PT SHORT TERM GOAL #1   Title  STG's=LTG's.        PT Long Term Goals - 09/22/19 1444      PT LONG TERM GOAL #1   Title  Independent with a HEP.    Time  6    Period  Weeks    Status  On-going      PT LONG TERM GOAL #2   Title  Eliminate right buttock pain.    Time  6    Period  Weeks    Status  Achieved      PT LONG TERM GOAL #3   Title  Perform ADL's with pain not > 3/10.    Time  6    Period  Weeks    Status  On-going            Plan - 09/29/19 1436    Clinical Impression Statement  Patient has improved greatly since beginning PT as he was observed walking around clinic in more erect stance without AD. Patient guided through more strengthening exercises with no complaints of pain but did require rest breaks during the therex session. 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 Normal modalities response noted following removal of the modalities.    Personal Factors and Comorbidities  Comorbidity 1;Comorbidity 2    Comorbidities  CAD,HTN, prostate CA (treated successfully per patient).    Examination-Activity Limitations  Bend;Locomotion Level;Other    Examination-Participation Restrictions  Other    Stability/Clinical Decision Making  Evolving/Moderate complexity    Rehab Potential  Good    PT Frequency  2x / week    PT Duration  6 weeks    PT Treatment/Interventions  ADLs/Self Care Home Management;Cryotherapy;Electrical Stimulation;Traction;Moist Heat;Functional mobility training;Therapeutic activities;Therapeutic exercise;Manual techniques;Patient/family education;Passive range of motion;Dry needling;Spinal Manipulations    PT Next Visit Plan  Please educate patient in log roll technique.  Nustep, draw-in progression .  Core exercise progression.  HMP, electrical stimulation.  STW/M    Consulted and Agree with Plan of Care  Patient       Patient will benefit from skilled therapeutic intervention in order to improve the following deficits and impairments:  Pain, Decreased activity tolerance, Decreased range of motion  Visit Diagnosis: Abnormal posture  Chronic right-sided low back pain with right-sided sciatica     Problem List Patient Active Problem List   Diagnosis Date Noted  . History of prostate cancer 07/30/2019  . Pulmonary nodules 05/07/2019  . Gastroesophageal reflux disease 10/26/2016  . Hematemesis without nausea 10/26/2016  . Atypical chest pain 10/26/2016  . Hemoptysis 10/03/2016  . Pre-diabetes 12/27/2015  . Essential hypertension 04/21/2015  . Hyperlipidemia 04/21/2015  . Morbid obesity ([place]) 04/21/2015  . Prostate cancer [facility])     Standley Brooking, PTA 09/29/2019, 2:51  PM  [facility] [address] [place], [place], [address] Phone: [phone]   Fax:  [phone]  Name: Gregory Malone MRN: [id number] Date of Birth: Aug 08, 1950

## 2019-10-03 DIAGNOSIS — Z6841 Body Mass Index (BMI) 40.0 and over, adult: Secondary | ICD-10-CM | POA: Diagnosis not present

## 2019-10-03 DIAGNOSIS — M461 Sacroiliitis, not elsewhere classified: Secondary | ICD-10-CM | POA: Diagnosis not present

## 2019-10-03 DIAGNOSIS — I10 Essential (primary) hypertension: Secondary | ICD-10-CM | POA: Diagnosis not present

## 2019-10-06 ENCOUNTER — Emergency Department (HOSPITAL_COMMUNITY): Payer: PPO

## 2019-10-06 ENCOUNTER — Encounter (HOSPITAL_COMMUNITY): Payer: Self-pay | Admitting: Emergency Medicine

## 2019-10-06 ENCOUNTER — Encounter: Payer: Self-pay | Admitting: Physical Therapy

## 2019-10-06 ENCOUNTER — Emergency Department (HOSPITAL_COMMUNITY)
Admission: EM | Admit: 2019-10-06 | Discharge: 2019-10-07 | Disposition: A | Discharge status: Home / Self Care | Payer: PPO | Attending: Emergency Medicine | Admitting: Emergency Medicine

## 2019-10-06 ENCOUNTER — Other Ambulatory Visit: Payer: Self-pay

## 2019-10-06 ENCOUNTER — Ambulatory Visit: Payer: PPO | Admitting: Physical Therapy

## 2019-10-06 DIAGNOSIS — Z79899 Other long term (current) drug therapy: Secondary | ICD-10-CM | POA: Diagnosis not present

## 2019-10-06 DIAGNOSIS — N23 Unspecified renal colic: Secondary | ICD-10-CM

## 2019-10-06 DIAGNOSIS — Z8546 Personal history of malignant neoplasm of prostate: Secondary | ICD-10-CM | POA: Diagnosis not present

## 2019-10-06 DIAGNOSIS — N132 Hydronephrosis with renal and ureteral calculous obstruction: Secondary | ICD-10-CM | POA: Diagnosis not present

## 2019-10-06 DIAGNOSIS — R1031 Right lower quadrant pain: Secondary | ICD-10-CM | POA: Diagnosis present

## 2019-10-06 DIAGNOSIS — R293 Abnormal posture: Secondary | ICD-10-CM

## 2019-10-06 DIAGNOSIS — I2510 Atherosclerotic heart disease of native coronary artery without angina pectoris: Secondary | ICD-10-CM | POA: Diagnosis not present

## 2019-10-06 DIAGNOSIS — M5441 Lumbago with sciatica, right side: Secondary | ICD-10-CM | POA: Diagnosis not present

## 2019-10-06 DIAGNOSIS — N201 Calculus of ureter: Secondary | ICD-10-CM | POA: Diagnosis not present

## 2019-10-06 DIAGNOSIS — I119 Hypertensive heart disease without heart failure: Secondary | ICD-10-CM | POA: Insufficient documentation

## 2019-10-06 DIAGNOSIS — G8929 Other chronic pain: Secondary | ICD-10-CM

## 2019-10-06 LAB — CBC
HCT: 44.7 % (ref 39.0–52.0)
Hemoglobin: 14.5 g/dL (ref 13.0–17.0)
MCH: 30.1 pg (ref 26.0–34.0)
MCHC: 32.4 g/dL (ref 30.0–36.0)
MCV: 92.7 fL (ref 80.0–100.0)
Platelets: 181 10*3/uL (ref 150–400)
RBC: 4.82 MIL/uL (ref 4.22–5.81)
RDW: 14.6 % (ref 11.5–15.5)
WBC: 10.9 10*3/uL — ABNORMAL HIGH (ref 4.0–10.5)
nRBC: 0 % (ref 0.0–0.2)

## 2019-10-06 LAB — URINALYSIS, ROUTINE W REFLEX MICROSCOPIC
Bacteria, UA: NONE SEEN
Bilirubin Urine: NEGATIVE
Glucose, UA: NEGATIVE mg/dL
Ketones, ur: NEGATIVE mg/dL
Leukocytes,Ua: NEGATIVE
Nitrite: NEGATIVE
Protein, ur: NEGATIVE mg/dL
RBC / HPF: >50 RBC/hpf — ABNORMAL HIGH (ref 0–5)
Specific Gravity, Urine: 1.014 (ref 1.005–1.030)
pH: 5 (ref 5.0–8.0)

## 2019-10-06 LAB — BASIC METABOLIC PANEL
Anion gap: 9 (ref 5–15)
BUN: 30 mg/dL — ABNORMAL HIGH (ref 8–23)
CO2: 26 mmol/L (ref 22–32)
Calcium: 8.7 mg/dL — ABNORMAL LOW (ref 8.9–10.3)
Chloride: 100 mmol/L (ref 98–111)
Creatinine, Ser: 1.64 mg/dL — ABNORMAL HIGH (ref 0.61–1.24)
GFR calc Af Amer: 49 mL/min — ABNORMAL LOW (ref >60)
GFR calc non Af Amer: 43 mL/min — ABNORMAL LOW (ref >60)
Glucose, Bld: 202 mg/dL — ABNORMAL HIGH (ref 70–99)
Potassium: 3.8 mmol/L (ref 3.5–5.1)
Sodium: 135 mmol/L (ref 135–145)

## 2019-10-06 MED ORDER — HYDROMORPHONE HCL 1 MG/ML IJ SOLN
1.0000 mg | Freq: Once | INTRAMUSCULAR | Status: AC
  Administered 2019-10-06: 1 mg via INTRAVENOUS
  Filled 2019-10-06: qty 1

## 2019-10-06 MED ORDER — ONDANSETRON HCL 4 MG/2ML IJ SOLN
4.0000 mg | Freq: Once | INTRAMUSCULAR | Status: AC
  Administered 2019-10-06: 4 mg via INTRAVENOUS
  Filled 2019-10-06: qty 2

## 2019-10-06 NOTE — ED Triage Notes (Signed)
Pt c/o rt sided flank pain that began this morning. Pt has a hx of kidney stones.

## 2019-10-06 NOTE — ED Provider Notes (Signed)
[facility] EMERGENCY DEPARTMENT Provider Note   CSN: [id number] Arrival date & time: 10/06/19  2129     History Chief Complaint  Patient presents with  . Flank Pain    Gregory Malone is a 69 y.o. male.  Patient presents to the emergency department for evaluation of right flank pain.  Patient had sudden onset of moderate to severe right flank pain earlier this evening.  He reports that he does have a history of kidney stones.  He has not had an episode of renal colic in approximately 10 years.  He does, however, report that he had an MRI performed approximately 3 months ago and was told that there was a stone in his right kidney.        Past Medical History:  Diagnosis Date  . Arthritis   . Asthma   . Bowel obstruction ([place])   . CAD (coronary artery disease)   . Chronic kidney disease    nephrolithiasis  couple episodes 10 years ago  . GERD (gastroesophageal reflux disease)   . Hx of colonic polyp   . Hypercholesterolemia   . Hypertension   . Prostate cancer ([place]) 03/26/12 bx   ,gleason=3+3=6,PSA=4.22,volume=34cc  . Pulmonary nodule   . Radiation 06/27/2012-08/20/2012   prostate 7600 cGy 38 sessions    Patient Active Problem List   Diagnosis Date Noted  . History of prostate cancer 07/30/2019  . Pulmonary nodules 05/07/2019  . Gastroesophageal reflux disease 10/26/2016  . Hematemesis without nausea 10/26/2016  . Atypical chest pain 10/26/2016  . Hemoptysis 10/03/2016  . Pre-diabetes 12/27/2015  . Essential hypertension 04/21/2015  . Hyperlipidemia 04/21/2015  . Morbid obesity ([place]) 04/21/2015  . Prostate cancer [facility])     Past Surgical History:  Procedure Laterality Date  . COLONOSCOPY    . POLYPECTOMY    . PROSTATE BIOPSY  03/26/12   adenocarcinoma  . TONSILLECTOMY    . UPPER GASTROINTESTINAL ENDOSCOPY         Family History  Problem Relation Age of Onset  . Prostate cancer Father 80  . Hypertension Father   . Hyperlipidemia Father   .  Prostate cancer Maternal Grandfather 60  . Prostate cancer Paternal Uncle   . Prostate cancer Cousin   . Hypertension Mother   . Uterine cancer Sister   . Diabetes Sister   . Colon cancer Maternal Aunt   . Colon cancer Cousin        x 2  . Stomach cancer Neg Hx   . Rectal cancer Neg Hx   . Pancreatic cancer Neg Hx   . Esophageal cancer Neg Hx     Social History   Tobacco Use  . Smoking status: Former Smoker    Packs/day: 1.00    Years: 10.00    Pack years: 10.00    Types: Cigarettes    Quit date: 06/19/1992    Years since quitting: 27.3  . Smokeless tobacco: Never Used  . Tobacco comment: quit smoking in 1994  Substance Use Topics  . Alcohol use: Yes    Alcohol/week: 2.0 standard drinks    Types: 2 Cans of beer per week  . Drug use: No    Home Medications Prior to Admission medications   Medication Sig Start Date End Date Taking? Authorizing Provider  acetaminophen (TYLENOL) 500 MG tablet Take 500 mg by mouth every 6 (six) hours as needed.    [provider]  amLODipine (NORVASC) 10 MG tablet TAKE 1 TABLET BY MOUTH EVERY DAY 07/28/19  Hawks, Christy A, FNP  atorvastatin (LIPITOR) 80 MG tablet TAKE 1 TABLET (80 MG TOTAL) BY MOUTH DAILY. (PLEASE SCHEDULE 6 MOS APPT) 08/29/19   Evelina Dun A, FNP  gabapentin (NEURONTIN) 300 MG capsule Take 1 capsule (300 mg total) by mouth 3 (three) times daily. 07/17/19   Evelina Dun A, FNP  hydrochlorothiazide (HYDRODIURIL) 25 MG tablet Take 1 tablet (25 mg total) by mouth daily. 09/08/19   Sharion Balloon, FNP  KLOR-CON M20 20 MEQ tablet TAKE 1 TABLET BY MOUTH EVERY DAY 09/01/19   Evelina Dun A, FNP  lisinopril (ZESTRIL) 40 MG tablet Take 1 tablet (40 mg total) by mouth daily. 09/08/19   Sharion Balloon, FNP  metoprolol tartrate (LOPRESSOR) 25 MG tablet Take 1 tablet (25 mg total) by mouth 2 (two) times daily. 08/11/19   Sharion Balloon, FNP  oxyCODONE-acetaminophen (PERCOCET) 5-325 MG tablet Take 1-2 tablets by mouth every 4  (four) hours as needed. 10/07/19   Orpah Greek, MD  tamsulosin (FLOMAX) 0.4 MG CAPS capsule Take 1 capsule (0.4 mg total) by mouth daily. 10/07/19   Orpah Greek, MD    Allergies    Patient has no known allergies.  Review of Systems   Review of Systems  Genitourinary: Positive for flank pain.  All other systems reviewed and are negative.   Physical Exam Updated Vital Signs BP (!) 148/75   Temp 98 F (36.7 C)   Resp 16   Ht 5\' 3"  (1.6 m)   Wt 113.4 kg   SpO2 96%   BMI 44.29 kg/m   Physical Exam Vitals and nursing note reviewed.  Constitutional:      General: He is not in acute distress.    Appearance: Normal appearance. He is well-developed.  HENT:     Head: Normocephalic and atraumatic.     Right Ear: Hearing normal.     Left Ear: Hearing normal.     Nose: Nose normal.  Eyes:     Conjunctiva/sclera: Conjunctivae normal.     Pupils: Pupils are equal, round, and reactive to light.  Cardiovascular:     Rate and Rhythm: Regular rhythm.     Heart sounds: S1 normal and S2 normal. No murmur. No friction rub. No gallop.   Pulmonary:     Effort: Pulmonary effort is normal. No respiratory distress.     Breath sounds: Normal breath sounds.  Chest:     Chest wall: No tenderness.  Abdominal:     General: Bowel sounds are normal.     Palpations: Abdomen is soft.     Tenderness: There is no abdominal tenderness. There is no guarding or rebound. Negative signs include Murphy's sign and McBurney's sign.     Hernia: No hernia is present.  Musculoskeletal:        General: Normal range of motion.     Cervical back: Normal range of motion and neck supple.  Skin:    General: Skin is warm and dry.     Findings: No rash.  Neurological:     Mental Status: He is alert and oriented to person, place, and time.     GCS: GCS eye subscore is 4. GCS verbal subscore is 5. GCS motor subscore is 6.     Cranial Nerves: No cranial nerve deficit.     Sensory: No sensory  deficit.     Coordination: Coordination normal.  Psychiatric:        Speech: Speech normal.  Behavior: Behavior normal.        Thought Content: Thought content normal.     ED Results / Procedures / Treatments   Labs (all labs ordered are listed, but only abnormal results are displayed) Labs Reviewed  URINALYSIS, ROUTINE W REFLEX MICROSCOPIC - Abnormal; Notable for the following components:      Result Value   Hgb urine dipstick LARGE (*)    RBC / HPF >50 (*)    All other components within normal limits  BASIC METABOLIC PANEL - Abnormal; Notable for the following components:   Glucose, Bld 202 (*)    BUN 30 (*)    Creatinine, Ser 1.64 (*)    Calcium 8.7 (*)    GFR calc non Af Amer 43 (*)    GFR calc Af Amer 49 (*)    All other components within normal limits  CBC - Abnormal; Notable for the following components:   WBC 10.9 (*)    All other components within normal limits  URINE CULTURE    EKG None  Radiology CT RENAL STONE STUDY  Result Date: 10/06/2019 CLINICAL DATA:  Right-sided flank pain EXAM: CT ABDOMEN AND PELVIS WITHOUT CONTRAST TECHNIQUE: Multidetector CT imaging of the abdomen and pelvis was performed following the standard protocol without IV contrast. COMPARISON:  Ultrasound 09/11/2018, CT 05/01/2019, 05/15/2018 FINDINGS: Lower chest: Lung bases are clear.  Small hiatal hernia. Hepatobiliary: Punctate stones in the gallbladder. No focal hepatic abnormality or biliary dilatation Pancreas: Unremarkable. No pancreatic ductal dilatation or surrounding inflammatory changes. Spleen: Normal in size without focal abnormality. Adrenals/Urinary Tract: Adrenal glands are normal. Mild right hydronephrosis and proximal hydroureter, secondary to a 5 x 6 mm stone in the proximal right ureter at approximate L3-L4 disc level. Right perinephric fat stranding and small amount of fluid. Intrarenal stone lower pole right kidney measuring 4 mm. Cyst in the midpole of the left kidney.  Urinary bladder unremarkable Stomach/Bowel: Stomach is within normal limits. Appendix appears normal. No evidence of bowel wall thickening, distention, or inflammatory changes. Sigmoid colon diverticula without acute inflammatory change. Vascular/Lymphatic: Moderate aortic atherosclerosis without aneurysm. No suspicious adenopathy. Reproductive: Clips at the prostate. Other: Negative for free air or free fluid. Musculoskeletal: Degenerative changes. No acute or suspicious osseous abnormality. IMPRESSION: 1. Mild right hydronephrosis and hydroureter, secondary to a 5 x 6 mm stone in the proximal right ureter at approximate L3-L4 level. Moderate right perinephric fat stranding and small amount of perinephric fluid, question forniceal rupture. 2. Intrarenal stone on the right. 3. Small gallstones Electronically Signed   By: Donavan Foil M.D.   On: 10/06/2019 23:57    Procedures Procedures (including critical care time)  Medications Ordered in ED Medications  HYDROmorphone (DILAUDID) injection 1 mg (1 mg Intravenous Given 10/06/19 2349)  ondansetron (ZOFRAN) injection 4 mg (4 mg Intravenous Given 10/06/19 2349)    ED Course  I have reviewed the triage vital signs and the nursing notes.  Pertinent labs & imaging results that were available during my care of the patient were reviewed by me and considered in my medical decision making (see chart for details).    MDM Rules/Calculators/A&P                      Patient presents to the emergency department for evaluation of right flank pain.  He does have a history of kidney stones approximately 10 years ago, but none since.  Work-up today does confirm proximal ureteral stone that is 5 x 6  mm.  No sign of infection.  Patient treated with analgesia.  Will discharge with analgesia, Flomax, follow-up with urology.  Final Clinical Impression(s) / ED Diagnoses Final diagnoses:  Ureteral colic    Rx / DC Orders ED Discharge Orders         Ordered     oxyCODONE-acetaminophen (PERCOCET) 5-325 MG tablet  Every 4 hours PRN     10/07/19 0152    tamsulosin (FLOMAX) 0.4 MG CAPS capsule  Daily     10/07/19 0152           Orpah Greek, MD 10/07/19 [phone]

## 2019-10-06 NOTE — Therapy (Signed)
[facility] [place], [place], [address] Phone: [phone]   Fax:  [phone]  Physical Therapy Treatment  Patient Details  Name: Gregory Malone MRN: [id number] Date of Birth: [birth year] Referring Provider (PT): Evelina Dun   Encounter Date: 10/06/2019  PT End of Session - 10/06/19 1345    Visit Number  8    Number of Visits  12    Date for PT Re-Evaluation  09/22/19    Authorization Type  FOTO.    PT Start Time  1345    PT Stop Time  1438    PT Time Calculation (min)  53 min    Activity Tolerance  Patient tolerated treatment well    Behavior During Therapy  WFL for tasks assessed/performed       Past Medical History:  Diagnosis Date  . Arthritis   . Asthma   . Bowel obstruction ([place])   . CAD (coronary artery disease)   . Chronic kidney disease    nephrolithiasis  couple episodes 10 years ago  . GERD (gastroesophageal reflux disease)   . Hx of colonic polyp   . Hypercholesterolemia   . Hypertension   . Prostate cancer ([place]) 03/26/12 bx   ,gleason=3+3=6,PSA=4.22,volume=34cc  . Pulmonary nodule   . Radiation 06/27/2012-08/20/2012   prostate 7600 cGy 38 sessions    Past Surgical History:  Procedure Laterality Date  . COLONOSCOPY    . POLYPECTOMY    . PROSTATE BIOPSY  03/26/12   adenocarcinoma  . TONSILLECTOMY    . UPPER GASTROINTESTINAL ENDOSCOPY      There were no vitals filed for this visit.  Subjective Assessment - 10/06/19 1342    Subjective  COVID 19 screening performed on patient upon arrival. Continues to report LE weakness but able to get up/down from the ground better.    Pertinent History  CAD,HTN, prostate CA (treated successfully per patient).    Diagnostic tests  MRI.    Patient Stated Goals  Get out of pain.    Currently in Pain?  No/denies         [facility] PT Assessment - 10/06/19 0001      Assessment   Medical Diagnosis  Chronic right-sided low back pain with right-sided  sciatica.    Referring Provider (PT)  Evelina Dun      Restrictions   Weight Bearing Restrictions  No                   [facility] Adult PT Treatment/Exercise - 10/06/19 0001      Lumbar Exercises: Aerobic   Nustep  L3 x16 min      Lumbar Exercises: Machines for Strengthening   Cybex Lumbar Extension  60# x30 reps    Cybex Knee Extension  10# 3x10 reps    Cybex Knee Flexion  30# 3x10 reps      Lumbar Exercises: Standing   Other Standing Lumbar Exercises  B shoulder D2 with core green XTS x20 reps each      Lumbar Exercises: Seated   Sit to Stand  10 reps      Modalities   Modalities  Electrical Stimulation;Moist Heat      Moist Heat Therapy   Number Minutes Moist Heat  10 Minutes    Moist Heat Location  Lumbar Spine      Electrical Stimulation   Electrical Stimulation Location  B low back    Electrical Stimulation Action  Pre-Mod    Electrical Stimulation Parameters  80-150  hz x10 min    Electrical Stimulation Goals  Pain               PT Short Term Goals - 08/11/19 1704      PT SHORT TERM GOAL #1   Title  STG's=LTG's.        PT Long Term Goals - 09/22/19 1444      PT LONG TERM GOAL #1   Title  Independent with a HEP.    Time  6    Period  Weeks    Status  On-going      PT LONG TERM GOAL #2   Title  Eliminate right buttock pain.    Time  6    Period  Weeks    Status  Achieved      PT LONG TERM GOAL #3   Title  Perform ADL's with pain not > 3/10.    Time  6    Period  Weeks    Status  On-going            Plan - 10/06/19 1518    Clinical Impression Statement  Patient presented in clinic with reports of continuing to become more independent or functional. Patient guided through more core and LE strengthening with VCs for erect posture. Continued gait observation with short stride lengths and minimal antalgic deviations. Normal modalities response noted following removal of the modalities.    Personal Factors and Comorbidities   Comorbidity 1;Comorbidity 2    Comorbidities  CAD,HTN, prostate CA (treated successfully per patient).    Examination-Activity Limitations  Bend;Locomotion Level;Other    Examination-Participation Restrictions  Other    Stability/Clinical Decision Making  Evolving/Moderate complexity    Rehab Potential  Good    PT Frequency  2x / week    PT Duration  6 weeks    PT Treatment/Interventions  ADLs/Self Care Home Management;Cryotherapy;Electrical Stimulation;Traction;Moist Heat;Functional mobility training;Therapeutic activities;Therapeutic exercise;Manual techniques;Patient/family education;Passive range of motion;Dry needling;Spinal Manipulations    PT Next Visit Plan  Please educate patient in log roll technique.  Nustep, draw-in progression .  Core exercise progression.  HMP, electrical stimulation.  STW/M    Consulted and Agree with Plan of Care  Patient       Patient will benefit from skilled therapeutic intervention in order to improve the following deficits and impairments:  Pain, Decreased activity tolerance, Decreased range of motion  Visit Diagnosis: Abnormal posture  Chronic right-sided low back pain with right-sided sciatica     Problem List Patient Active Problem List   Diagnosis Date Noted  . History of prostate cancer 07/30/2019  . Pulmonary nodules 05/07/2019  . Gastroesophageal reflux disease 10/26/2016  . Hematemesis without nausea 10/26/2016  . Atypical chest pain 10/26/2016  . Hemoptysis 10/03/2016  . Pre-diabetes 12/27/2015  . Essential hypertension 04/21/2015  . Hyperlipidemia 04/21/2015  . Morbid obesity ([place]) 04/21/2015  . Prostate cancer [facility])     Standley Brooking, PTA 10/06/2019, 3:28 PM  [facility] [address] [place], [place], [address] Phone: [phone]   Fax:  [phone]  Name: Gregory Malone MRN: [id number] Date of Birth: [birth year]

## 2019-10-07 ENCOUNTER — Other Ambulatory Visit: Payer: PPO

## 2019-10-07 DIAGNOSIS — E559 Vitamin D deficiency, unspecified: Secondary | ICD-10-CM | POA: Diagnosis not present

## 2019-10-07 DIAGNOSIS — I129 Hypertensive chronic kidney disease with stage 1 through stage 4 chronic kidney disease, or unspecified chronic kidney disease: Secondary | ICD-10-CM | POA: Diagnosis not present

## 2019-10-07 DIAGNOSIS — N1831 Chronic kidney disease, stage 3a: Secondary | ICD-10-CM | POA: Diagnosis not present

## 2019-10-07 DIAGNOSIS — N200 Calculus of kidney: Secondary | ICD-10-CM | POA: Diagnosis not present

## 2019-10-07 MED ORDER — OXYCODONE-ACETAMINOPHEN 5-325 MG PO TABS: 1.0000 | ORAL_TABLET | ORAL | 0 refills | Status: DC | PRN

## 2019-10-07 MED ORDER — TAMSULOSIN HCL 0.4 MG PO CAPS: 0.4000 mg | ORAL_CAPSULE | Freq: Every day | ORAL | 0 refills | Status: DC

## 2019-10-08 ENCOUNTER — Encounter: Payer: Self-pay | Admitting: Family Medicine

## 2019-10-08 ENCOUNTER — Other Ambulatory Visit: Payer: Self-pay

## 2019-10-08 ENCOUNTER — Ambulatory Visit (INDEPENDENT_AMBULATORY_CARE_PROVIDER_SITE_OTHER): Payer: PPO | Admitting: Family Medicine

## 2019-10-08 VITALS — BP 125/79 | HR 96 | Temp 98.2°F | Ht 63.0 in | Wt 249.0 lb

## 2019-10-08 DIAGNOSIS — N23 Unspecified renal colic: Secondary | ICD-10-CM | POA: Diagnosis not present

## 2019-10-08 DIAGNOSIS — Z87442 Personal history of urinary calculi: Secondary | ICD-10-CM | POA: Diagnosis not present

## 2019-10-08 LAB — URINALYSIS, COMPLETE
Bilirubin, UA: NEGATIVE
Glucose, UA: NEGATIVE
Ketones, UA: NEGATIVE
Leukocytes,UA: NEGATIVE
Nitrite, UA: NEGATIVE
Protein,UA: NEGATIVE
Specific Gravity, UA: 1.02 (ref 1.005–1.030)
Urobilinogen, Ur: 0.2 mg/dL (ref 0.2–1.0)
pH, UA: 6 (ref 5.0–7.5)

## 2019-10-08 LAB — MICROSCOPIC EXAMINATION
Bacteria, UA: NONE SEEN
Epithelial Cells (non renal): NONE SEEN /hpf (ref 0–10)
Renal Epithel, UA: NONE SEEN /hpf

## 2019-10-08 LAB — URINE CULTURE: Culture: NO GROWTH

## 2019-10-08 NOTE — Progress Notes (Signed)
Chief Complaint  Patient presents with  . Referral    Urology for kidney stones    HPI  Patient presents today for right sided flank pain.  Went to the emergency room a couple days ago and was told that he had a rather large stone in the right ureter.  Pain has subsided since then but is still present but manageable on the right flank area.  He says he has had 2 previous episodes of renal colic.  He also had a CT of his kidneys done a few months ago that showed that he did have a stone present in the kidney at that time.  He is here today for referral to have definitive treatment up to and including lithotripsy.  The pain does make him nauseous intermittently but he has not had any vomiting or fever.  PMH: Smoking status noted ROS: Per HPI  Objective: BP 125/79   Pulse 96   Temp 98.2 F (36.8 C) (Temporal)   Ht 5\' 3"  (1.6 m)   Wt 249 lb (112.9 kg)   BMI 44.11 kg/m  Gen: NAD, alert, cooperative with exam HEENT: NCAT, EOMI, PERRL CV: RRR, good S1/S2, no murmur Resp: CTABL, no wheezes, non-labored Abd: Tender at the right flank posteriorly.  Abdomen otherwise nontender.  No masses.  Rotund. Ext: No edema, warm Neuro: Alert and oriented, No gross deficits Urine micro shows 3-10 RBCs per high-powered field otherwise unremarkable. Assessment and plan:  1. Ureteral colic   2. History of kidney stones     No orders of the defined types were placed in this encounter.   Orders Placed This Encounter  Procedures  . Urine Culture    Standing Status:   Future    Number of Occurrences:   1    Standing Expiration Date:   11/07/2019  . Urinalysis, Complete  . Ambulatory referral to Urology    Referral Priority:   Urgent    Referral Type:   Consultation    Referral Reason:   Specialty Services Required    Requested Specialty:   Urology    Number of Visits Requested:   1    Follow up as needed.  Claretta Fraise, MD

## 2019-10-09 DIAGNOSIS — N201 Calculus of ureter: Secondary | ICD-10-CM | POA: Diagnosis not present

## 2019-10-10 LAB — URINE CULTURE

## 2019-10-15 DIAGNOSIS — N132 Hydronephrosis with renal and ureteral calculous obstruction: Secondary | ICD-10-CM | POA: Diagnosis not present

## 2019-10-15 DIAGNOSIS — E559 Vitamin D deficiency, unspecified: Secondary | ICD-10-CM | POA: Diagnosis not present

## 2019-10-15 DIAGNOSIS — N200 Calculus of kidney: Secondary | ICD-10-CM | POA: Diagnosis not present

## 2019-10-15 DIAGNOSIS — I129 Hypertensive chronic kidney disease with stage 1 through stage 4 chronic kidney disease, or unspecified chronic kidney disease: Secondary | ICD-10-CM | POA: Diagnosis not present

## 2019-10-15 DIAGNOSIS — Z79899 Other long term (current) drug therapy: Secondary | ICD-10-CM | POA: Diagnosis not present

## 2019-10-15 DIAGNOSIS — N178 Other acute kidney failure: Secondary | ICD-10-CM | POA: Diagnosis not present

## 2019-10-15 DIAGNOSIS — N1831 Chronic kidney disease, stage 3a: Secondary | ICD-10-CM | POA: Diagnosis not present

## 2019-10-19 ENCOUNTER — Other Ambulatory Visit: Payer: Self-pay | Admitting: Family

## 2019-10-19 DIAGNOSIS — I10 Essential (primary) hypertension: Secondary | ICD-10-CM

## 2019-10-20 ENCOUNTER — Other Ambulatory Visit: Payer: Self-pay

## 2019-10-20 ENCOUNTER — Encounter: Payer: Self-pay | Admitting: Physical Therapy

## 2019-10-20 ENCOUNTER — Ambulatory Visit: Payer: PPO | Attending: Neurosurgery | Admitting: Physical Therapy

## 2019-10-20 DIAGNOSIS — M5441 Lumbago with sciatica, right side: Secondary | ICD-10-CM | POA: Diagnosis not present

## 2019-10-20 DIAGNOSIS — G8929 Other chronic pain: Secondary | ICD-10-CM | POA: Insufficient documentation

## 2019-10-20 DIAGNOSIS — R293 Abnormal posture: Secondary | ICD-10-CM | POA: Diagnosis not present

## 2019-10-20 NOTE — Therapy (Signed)
[facility] [place], [place], [address] Phone: [phone]   Fax:  [phone]  Physical Therapy Treatment  PHYSICAL THERAPY DISCHARGE SUMMARY  Visits from Start of Care: 9  Current functional level related to goals / functional outcomes: See below   Remaining deficits: See goals   Education / Equipment: HEP Plan: Patient agrees to discharge.  Patient goals were met. Patient is being discharged due to meeting the stated rehab goals.  ?????     Patient Details  Name: Gregory Malone MRN: [id number] Date of Birth: 05/18/1951 Referring Provider (PT): Evelina Dun   Encounter Date: 10/20/2019  PT End of Session - 10/20/19 1351    Visit Number  9    Number of Visits  12    Date for PT Re-Evaluation  09/22/19    Authorization Type  FOTO.    PT Start Time  1345    PT Stop Time  1433    PT Time Calculation (min)  48 min    Activity Tolerance  Patient tolerated treatment well    Behavior During Therapy  WFL for tasks assessed/performed       Past Medical History:  Diagnosis Date  . Arthritis   . Asthma   . Bowel obstruction ([place])   . CAD (coronary artery disease)   . Chronic kidney disease    nephrolithiasis  couple episodes 10 years ago  . GERD (gastroesophageal reflux disease)   . Hx of colonic polyp   . Hypercholesterolemia   . Hypertension   . Prostate cancer ([place]) 03/26/12 bx   ,gleason=3+3=6,PSA=4.22,volume=34cc  . Pulmonary nodule   . Radiation 06/27/2012-08/20/2012   prostate 7600 cGy 38 sessions    Past Surgical History:  Procedure Laterality Date  . COLONOSCOPY    . POLYPECTOMY    . PROSTATE BIOPSY  03/26/12   adenocarcinoma  . TONSILLECTOMY    . UPPER GASTROINTESTINAL ENDOSCOPY      There were no vitals filed for this visit.  Subjective Assessment - 10/20/19 1349    Subjective  COVID 19 screening performed on patient upon arrival. Patient arrives feeling good and is ready for  discharge, Feeling 99.5% back to normal.    Pertinent History  CAD,HTN, prostate CA (treated successfully per patient).    Diagnostic tests  MRI.    Patient Stated Goals  Get out of pain.    Currently in Pain?  No/denies         [facility] PT Assessment - 10/20/19 0001      Assessment   Medical Diagnosis  Chronic right-sided low back pain with right-sided sciatica.    Referring Provider (PT)  Evelina Dun      Observation/Other Assessments   Focus on Therapeutic Outcomes (FOTO)   17% limitation                   [facility] Adult PT Treatment/Exercise - 10/20/19 0001      Lumbar Exercises: Aerobic   Nustep  L3 x16 min      Lumbar Exercises: Machines for Strengthening   Cybex Lumbar Extension  60# x30 reps    Cybex Knee Extension  10# 3x10 reps    Cybex Knee Flexion  30# 3x10 reps      Modalities   Modalities  Electrical Stimulation;Moist Heat      Moist Heat Therapy   Number Minutes Moist Heat  10 Minutes    Moist Heat Location  Lumbar Spine      Electrical  Stimulation   Electrical Stimulation Location  B low back    Electrical Stimulation Action  pre-mod    Electrical Stimulation Parameters  80-150 hz x10 min    Electrical Stimulation Goals  Pain               PT Short Term Goals - 08/11/19 1704      PT SHORT TERM GOAL #1   Title  STG's=LTG's.        PT Long Term Goals - 10/20/19 1410      PT LONG TERM GOAL #1   Title  Independent with a HEP.    Time  6    Period  Weeks    Status  Achieved      PT LONG TERM GOAL #2   Title  Eliminate right buttock pain.    Time  6    Period  Weeks    Status  Achieved      PT LONG TERM GOAL #3   Title  Perform ADL's with pain not > 3/10.    Period  Weeks    Status  Achieved            Plan - 10/20/19 1426    Clinical Impression Statement  Patient arrives feeling 99.5% better than pain at initial evaluation. Patient feels confident with HEP and denies pain just discomfort with certain motions but  it is tolerable. Patient was able to complete TEs with good form and technique. Patient's FOTO limitation at 17%. Patient instructed to continue HEP to maintain gains from skilled PT. Patient reported understanding. Patient discharged with all goals met.    Personal Factors and Comorbidities  Comorbidity 1;Comorbidity 2    Comorbidities  CAD,HTN, prostate CA (treated successfully per patient).    Examination-Activity Limitations  Bend;Locomotion Level;Other    Examination-Participation Restrictions  Other    Stability/Clinical Decision Making  Evolving/Moderate complexity    Clinical Decision Making  Moderate    Rehab Potential  Good    PT Frequency  2x / week    PT Duration  6 weeks    PT Treatment/Interventions  ADLs/Self Care Home Management;Cryotherapy;Electrical Stimulation;Traction;Moist Heat;Functional mobility training;Therapeutic activities;Therapeutic exercise;Manual techniques;Patient/family education;Passive range of motion;Dry needling;Spinal Manipulations    PT Next Visit Plan  DC    Consulted and Agree with Plan of Care  Patient       Patient will benefit from skilled therapeutic intervention in order to improve the following deficits and impairments:  Pain, Decreased activity tolerance, Decreased range of motion  Visit Diagnosis: Abnormal posture  Chronic right-sided low back pain with right-sided sciatica     Problem List Patient Active Problem List   Diagnosis Date Noted  . History of prostate cancer 07/30/2019  . Pulmonary nodules 05/07/2019  . Gastroesophageal reflux disease 10/26/2016  . Hematemesis without nausea 10/26/2016  . Atypical chest pain 10/26/2016  . Hemoptysis 10/03/2016  . Pre-diabetes 12/27/2015  . Essential hypertension 04/21/2015  . Hyperlipidemia 04/21/2015  . Morbid obesity ([place]) 04/21/2015  . Prostate cancer [facility])     Gabriela Eves, PT, DPT 10/20/2019, 2:47 PM  [facility] [address] [place], [place], [address] Phone: [phone]   Fax:  [phone]  Name: Gregory Malone MRN: [id number] Date of Birth: 1950/08/05

## 2019-10-21 DIAGNOSIS — N201 Calculus of ureter: Secondary | ICD-10-CM | POA: Diagnosis not present

## 2019-11-23 ENCOUNTER — Other Ambulatory Visit: Payer: Self-pay | Admitting: Family

## 2019-11-23 DIAGNOSIS — E785 Hyperlipidemia, unspecified: Secondary | ICD-10-CM

## 2019-11-27 ENCOUNTER — Other Ambulatory Visit: Payer: Self-pay | Admitting: Family

## 2019-12-04 DIAGNOSIS — N201 Calculus of ureter: Secondary | ICD-10-CM | POA: Diagnosis not present

## 2019-12-04 DIAGNOSIS — N183 Chronic kidney disease, stage 3 unspecified: Secondary | ICD-10-CM | POA: Diagnosis not present

## 2019-12-12 DIAGNOSIS — N200 Calculus of kidney: Secondary | ICD-10-CM | POA: Diagnosis not present

## 2019-12-12 DIAGNOSIS — N178 Other acute kidney failure: Secondary | ICD-10-CM | POA: Diagnosis not present

## 2019-12-12 DIAGNOSIS — I129 Hypertensive chronic kidney disease with stage 1 through stage 4 chronic kidney disease, or unspecified chronic kidney disease: Secondary | ICD-10-CM | POA: Diagnosis not present

## 2019-12-12 DIAGNOSIS — N1831 Chronic kidney disease, stage 3a: Secondary | ICD-10-CM | POA: Diagnosis not present

## 2020-01-18 ENCOUNTER — Other Ambulatory Visit: Payer: Self-pay | Admitting: Family Medicine

## 2020-01-18 DIAGNOSIS — I10 Essential (primary) hypertension: Secondary | ICD-10-CM

## 2020-02-02 ENCOUNTER — Ambulatory Visit (INDEPENDENT_AMBULATORY_CARE_PROVIDER_SITE_OTHER): Payer: PPO | Admitting: Family

## 2020-02-02 ENCOUNTER — Encounter: Payer: Self-pay | Admitting: Family

## 2020-02-02 DIAGNOSIS — H9192 Unspecified hearing loss, left ear: Secondary | ICD-10-CM

## 2020-02-02 DIAGNOSIS — H6122 Impacted cerumen, left ear: Secondary | ICD-10-CM | POA: Diagnosis not present

## 2020-02-02 NOTE — Progress Notes (Signed)
   Virtual Visit via telephone Note Due to COVID-19 pandemic this visit was conducted virtually. This visit type was conducted due to national recommendations for restrictions regarding the COVID-19 Pandemic (e.g. social distancing, sheltering in place) in an effort to limit this patient's exposure and mitigate transmission in our community. All issues noted in this document were discussed and addressed.  A physical exam was not performed with this format.  I connected with Gregory Malone on 02/02/20 at 2:00 pm  by telephone and verified that I am speaking with the correct person using two identifiers. Gregory Malone is currently located at home and no one is currently with him during visit. The provider, Evelina Dun, FNP is located in their office at time of visit.  I discussed the limitations, risks, security and privacy concerns of performing an evaluation and management service by telephone and the availability of in person appointments. I also discussed with the patient that there may be a patient responsible charge related to this service. The patient expressed understanding and agreed to proceed.   History and Present Illness:  Ear Fullness  There is pain in the left ear. This is a new problem. The current episode started in the past 7 days. The problem occurs every few minutes. The problem has been waxing and waning. There has been no fever. The pain is at a severity of 0/10. The patient is experiencing no pain. Associated symptoms include hearing loss. Pertinent negatives include no coughing, diarrhea, ear discharge, rhinorrhea, sore throat or vomiting. He has tried ear drops for the symptoms. The treatment provided mild relief. His past medical history is significant for hearing loss.      Review of Systems  HENT: Positive for hearing loss. Negative for ear discharge, rhinorrhea and sore throat.   Respiratory: Negative for cough.   Gastrointestinal: Negative for diarrhea and  vomiting.  All other systems reviewed and are negative.    Observations/Objective: No SOB or distress noted  Assessment and Plan: 1. Left ear impacted cerumen - Ambulatory referral to ENT  2. Hearing loss of left ear, unspecified hearing loss type - Ambulatory referral to ENT  Pt will come to office and have left ear cleaned. If unable to clean, he will follow up with ENT.  Keep clean and dry    I discussed the assessment and treatment plan with the patient. The patient was provided an opportunity to ask questions and all were answered. The patient agreed with the plan and demonstrated an understanding of the instructions.   The patient was advised to call back or seek an in-person evaluation if the symptoms worsen or if the condition fails to improve as anticipated.  The above assessment and management plan was discussed with the patient. The patient verbalized understanding of and has agreed to the management plan. Patient is aware to call the clinic if symptoms persist or worsen. Patient is aware when to return to the clinic for a follow-up visit. Patient educated on when it is appropriate to go to the emergency department.   Time call ended:  2:08 pm   I provided 8 minutes of non-face-to-face time during this encounter.    Evelina Dun, FNP

## 2020-02-09 ENCOUNTER — Other Ambulatory Visit: Payer: Self-pay | Admitting: Family

## 2020-02-09 DIAGNOSIS — M5431 Sciatica, right side: Secondary | ICD-10-CM

## 2020-02-09 DIAGNOSIS — M5441 Lumbago with sciatica, right side: Secondary | ICD-10-CM

## 2020-02-15 ENCOUNTER — Other Ambulatory Visit: Payer: Self-pay | Admitting: Family Medicine

## 2020-02-15 DIAGNOSIS — I10 Essential (primary) hypertension: Secondary | ICD-10-CM

## 2020-02-16 ENCOUNTER — Other Ambulatory Visit: Payer: Self-pay | Admitting: Family

## 2020-02-16 DIAGNOSIS — I10 Essential (primary) hypertension: Secondary | ICD-10-CM

## 2020-02-16 MED ORDER — AMLODIPINE BESYLATE 10 MG PO TABS: 10.0000 mg | ORAL_TABLET | Freq: Every day | ORAL | 0 refills | Status: DC

## 2020-02-16 NOTE — Telephone Encounter (Signed)
Stacks. NTBS 30 days given 01/19/20

## 2020-02-16 NOTE — Telephone Encounter (Signed)
Pt aware refill sent to pharmacy 

## 2020-02-16 NOTE — Telephone Encounter (Signed)
Refilled x 30 days, has appointment scheduled

## 2020-02-16 NOTE — Telephone Encounter (Signed)
  Prescription Request  02/16/2020  What is the name of the medication or equipment? Amlodipine 10 mg has appt 9-27 and needs enough called in to get him through  Have you contacted your pharmacy to request a refill? (if applicable) YES  Which pharmacy would you like this sent to? [facility]  Patient notified that their request is being sent to the clinical staff for review and that they should receive a response within 2 business days.

## 2020-02-16 NOTE — Addendum Note (Signed)
Addended by: Christia Reading on: 02/16/2020 04:37 PM   Modules accepted: Orders

## 2020-03-02 ENCOUNTER — Other Ambulatory Visit: Payer: Self-pay | Admitting: Family

## 2020-03-10 DIAGNOSIS — N201 Calculus of ureter: Secondary | ICD-10-CM | POA: Diagnosis not present

## 2020-03-11 ENCOUNTER — Other Ambulatory Visit: Payer: Self-pay | Admitting: Family

## 2020-03-11 DIAGNOSIS — I10 Essential (primary) hypertension: Secondary | ICD-10-CM

## 2020-03-14 ENCOUNTER — Other Ambulatory Visit: Payer: Self-pay | Admitting: Family

## 2020-03-14 DIAGNOSIS — E785 Hyperlipidemia, unspecified: Secondary | ICD-10-CM

## 2020-03-15 ENCOUNTER — Encounter: Payer: Self-pay | Admitting: Family

## 2020-03-15 ENCOUNTER — Ambulatory Visit (INDEPENDENT_AMBULATORY_CARE_PROVIDER_SITE_OTHER): Payer: PPO | Admitting: Family

## 2020-03-15 ENCOUNTER — Other Ambulatory Visit: Payer: Self-pay | Admitting: Family

## 2020-03-15 ENCOUNTER — Other Ambulatory Visit: Payer: Self-pay

## 2020-03-15 VITALS — BP 113/72 | HR 87 | Temp 97.5°F | Ht 63.0 in | Wt 266.4 lb

## 2020-03-15 DIAGNOSIS — I10 Essential (primary) hypertension: Secondary | ICD-10-CM

## 2020-03-15 DIAGNOSIS — R7303 Prediabetes: Secondary | ICD-10-CM

## 2020-03-15 DIAGNOSIS — E785 Hyperlipidemia, unspecified: Secondary | ICD-10-CM

## 2020-03-15 DIAGNOSIS — R918 Other nonspecific abnormal finding of lung field: Secondary | ICD-10-CM | POA: Diagnosis not present

## 2020-03-15 DIAGNOSIS — Z23 Encounter for immunization: Secondary | ICD-10-CM | POA: Diagnosis not present

## 2020-03-15 DIAGNOSIS — K219 Gastro-esophageal reflux disease without esophagitis: Secondary | ICD-10-CM

## 2020-03-15 DIAGNOSIS — Z8546 Personal history of malignant neoplasm of prostate: Secondary | ICD-10-CM

## 2020-03-15 NOTE — Patient Instructions (Signed)
Health Maintenance After Age 68 After age 68, you are at a higher risk for certain long-term diseases and infections as well as injuries from falls. Falls are a major cause of broken bones and head injuries in people who are older than age 68. Getting regular preventive care can help to keep you healthy and well. Preventive care includes getting regular testing and making lifestyle changes as recommended by your health care provider. Talk with your health care provider about:  Which screenings and tests you should have. A screening is a test that checks for a disease when you have no symptoms.  A diet and exercise plan that is right for you. What should I know about screenings and tests to prevent falls? Screening and testing are the best ways to find a health problem early. Early diagnosis and treatment give you the best chance of managing medical conditions that are common after age 68. Certain conditions and lifestyle choices may make you more likely to have a fall. Your health care provider may recommend:  Regular vision checks. Poor vision and conditions such as cataracts can make you more likely to have a fall. If you wear glasses, make sure to get your prescription updated if your vision changes.  Medicine review. Work with your health care provider to regularly review all of the medicines you are taking, including over-the-counter medicines. Ask your health care provider about any side effects that may make you more likely to have a fall. Tell your health care provider if any medicines that you take make you feel dizzy or sleepy.  Osteoporosis screening. Osteoporosis is a condition that causes the bones to get weaker. This can make the bones weak and cause them to break more easily.  Blood pressure screening. Blood pressure changes and medicines to control blood pressure can make you feel dizzy.  Strength and balance checks. Your health care provider may recommend certain tests to check your  strength and balance while standing, walking, or changing positions.  Foot health exam. Foot pain and numbness, as well as not wearing proper footwear, can make you more likely to have a fall.  Depression screening. You may be more likely to have a fall if you have a fear of falling, feel emotionally low, or feel unable to do activities that you used to do.  Alcohol use screening. Using too much alcohol can affect your balance and may make you more likely to have a fall. What actions can I take to lower my risk of falls? General instructions  Talk with your health care provider about your risks for falling. Tell your health care provider if: ? You fall. Be sure to tell your health care provider about all falls, even ones that seem minor. ? You feel dizzy, sleepy, or off-balance.  Take over-the-counter and prescription medicines only as told by your health care provider. These include any supplements.  Eat a healthy diet and maintain a healthy weight. A healthy diet includes low-fat dairy products, low-fat (lean) meats, and fiber from whole grains, beans, and lots of fruits and vegetables. Home safety  Remove any tripping hazards, such as rugs, cords, and clutter.  Install safety equipment such as grab bars in bathrooms and safety rails on stairs.  Keep rooms and walkways well-lit. Activity   Follow a regular exercise program to stay fit. This will help you maintain your balance. Ask your health care provider what types of exercise are appropriate for you.  If you need a cane or   walker, use it as recommended by your health care provider.  Wear supportive shoes that have nonskid soles. Lifestyle  Do not drink alcohol if your health care provider tells you not to drink.  If you drink alcohol, limit how much you have: ? 0-1 drink a day for women. ? 0-2 drinks a day for men.  Be aware of how much alcohol is in your drink. In the [place] one drink equals one typical bottle of beer (12  oz), one-half glass of wine (5 oz), or one shot of hard liquor (1 oz).  Do not use any products that contain nicotine or tobacco, such as cigarettes and e-cigarettes. If you need help quitting, ask your health care provider. Summary  Having a healthy lifestyle and getting preventive care can help to protect your health and wellness after age 68.  Screening and testing are the best way to find a health problem early and help you avoid having a fall. Early diagnosis and treatment give you the best chance for managing medical conditions that are more common for people who are older than age 68.  Falls are a major cause of broken bones and head injuries in people who are older than age 68. Take precautions to prevent a fall at home.  Work with your health care provider to learn what changes you can make to improve your health and wellness and to prevent falls. This information is not intended to replace advice given to you by your health care provider. Make sure you discuss any questions you have with your health care provider. Document Revised: 09/26/2018 Document Reviewed: 04/18/2017 Elsevier Patient Education  2020 [employer]  

## 2020-03-15 NOTE — Progress Notes (Signed)
Subjective:    Patient ID: Gregory Malone, male    DOB: Oct 25, 1950, 69 y.o.   MRN: [id number]  Chief Complaint  Patient presents with  . Medical Management of Chronic Issues  . Hypertension   Pt presents to the office today for chronic follow up. He has hx of Prostate Cancer 2013.   He has benign pulmonary nodules that are stable, last Ct scan .He was 04/2019. He followed by Nephrologists for CKD stage 2.  He has chronic back pain and scheduled for MRI today.  Hypertension This is a chronic problem. The current episode started more than 1 year ago. The problem has been resolved since onset. The problem is controlled. Associated symptoms include peripheral edema. Pertinent negatives include no malaise/fatigue or shortness of breath. Risk factors for coronary artery disease include dyslipidemia, sedentary lifestyle and male gender. The current treatment provides moderate improvement. There is no history of kidney disease or heart failure.  Gastroesophageal Reflux He complains of belching and heartburn. This is a chronic problem. The current episode started more than 1 year ago. The problem occurs rarely. The problem has been waxing and waning. The symptoms are aggravated by certain foods. Risk factors include obesity. He has tried a PPI for the symptoms. The treatment provided moderate relief.  Hyperlipidemia This is a chronic problem. The current episode started more than 1 year ago. The problem is controlled. Exacerbating diseases include obesity. Pertinent negatives include no shortness of breath. Current antihyperlipidemic treatment includes statins. The current treatment provides moderate improvement of lipids. Risk factors for coronary artery disease include dyslipidemia, diabetes mellitus, male sex, hypertension and a sedentary lifestyle.  Back Pain This is a chronic problem. The current episode started more than 1 year ago. The problem occurs intermittently. The problem has been  waxing and waning since onset. The pain is present in the lumbar spine. The quality of the pain is described as aching. The pain is at a severity of 0/10 (1 out 10 at times). The pain is mild. The symptoms are aggravated by twisting. He has tried bed rest and home exercises for the symptoms. The treatment provided mild relief.      Review of Systems  Constitutional: Negative for malaise/fatigue.  Respiratory: Negative for shortness of breath.   Gastrointestinal: Positive for heartburn.  Musculoskeletal: Positive for back pain.  All other systems reviewed and are negative.      Objective:   Physical Exam Vitals reviewed.  Constitutional:      General: He is not in acute distress.    Appearance: He is well-developed. He is obese.  HENT:     Head: Normocephalic.     Right Ear: Tympanic membrane normal.     Left Ear: Tympanic membrane normal.  Eyes:     General:        Right eye: No discharge.        Left eye: No discharge.     Pupils: Pupils are equal, round, and reactive to light.  Neck:     Thyroid: No thyromegaly.  Cardiovascular:     Rate and Rhythm: Normal rate and regular rhythm.     Heart sounds: Normal heart sounds. No murmur heard.   Pulmonary:     Effort: Pulmonary effort is normal. No respiratory distress.     Breath sounds: Normal breath sounds. No wheezing.  Abdominal:     General: Bowel sounds are normal. There is no distension.     Palpations: Abdomen is soft.  Tenderness: There is no abdominal tenderness.  Musculoskeletal:        General: No tenderness. Normal range of motion.     Cervical back: Normal range of motion and neck supple.  Skin:    General: Skin is warm and dry.     Findings: No erythema or rash.  Neurological:     Mental Status: He is alert and oriented to person, place, and time.     Cranial Nerves: No cranial nerve deficit.     Deep Tendon Reflexes: Reflexes are normal and symmetric.  Psychiatric:        Behavior: Behavior normal.         Thought Content: Thought content normal.        Judgment: Judgment normal.          BP 113/72   Pulse 87   Temp (!) 97.5 F (36.4 C) (Temporal)   Ht $R'5\' 3"'BZ$  (1.6 m)   Wt 266 lb 6.4 oz (120.8 kg)   BMI 47.19 kg/m   Assessment & Plan:  Gregory Malone comes in today with chief complaint of Medical Management of Chronic Issues and Hypertension   Diagnosis and orders addressed:  1. Need for immunization against influenza - Flu Vaccine QUAD High Dose(Fluad) - CMP14+EGFR - CBC with Differential/Platelet  2. Essential hypertension - CMP14+EGFR - CBC with Differential/Platelet  3. Gastroesophageal reflux disease, unspecified whether esophagitis present - CMP14+EGFR - CBC with Differential/Platelet  4. History of prostate cancer - CMP14+EGFR - CBC with Differential/Platelet  5. Hyperlipidemia, unspecified hyperlipidemia type - CMP14+EGFR - CBC with Differential/Platelet - Lipid panel  6. Pulmonary nodules - CMP14+EGFR - CBC with Differential/Platelet  7. Pre-diabetes - CMP14+EGFR - CBC with Differential/Platelet  8. Morbid obesity ([place]) - CMP14+EGFR - CBC with Differential/Platelet   Labs pending Health Maintenance reviewed Diet and exercise encouraged  Follow up plan: 6 months    Evelina Dun, FNP

## 2020-03-16 LAB — CMP14+EGFR
ALT: 22 IU/L (ref 0–44)
AST: 22 IU/L (ref 0–40)
Albumin/Globulin Ratio: 1.6 (ref 1.2–2.2)
Albumin: 4 g/dL (ref 3.8–4.8)
Alkaline Phosphatase: 104 IU/L (ref 44–121)
BUN/Creatinine Ratio: 20 (ref 10–24)
BUN: 25 mg/dL (ref 8–27)
Bilirubin Total: 0.3 mg/dL (ref 0.0–1.2)
CO2: 29 mmol/L (ref 20–29)
Calcium: 9.5 mg/dL (ref 8.6–10.2)
Chloride: 103 mmol/L (ref 96–106)
Creatinine, Ser: 1.26 mg/dL (ref 0.76–1.27)
GFR calc Af Amer: 68 mL/min/{1.73_m2} (ref >59)
GFR calc non Af Amer: 59 mL/min/{1.73_m2} — ABNORMAL LOW (ref >59)
Globulin, Total: 2.5 g/dL (ref 1.5–4.5)
Glucose: 109 mg/dL — ABNORMAL HIGH (ref 65–99)
Potassium: 4.3 mmol/L (ref 3.5–5.2)
Sodium: 144 mmol/L (ref 134–144)
Total Protein: 6.5 g/dL (ref 6.0–8.5)

## 2020-03-16 LAB — CBC WITH DIFFERENTIAL/PLATELET
Basophils Absolute: 0 10*3/uL (ref 0.0–0.2)
Basos: 1 %
EOS (ABSOLUTE): 0.2 10*3/uL (ref 0.0–0.4)
Eos: 3 %
Hematocrit: 46.4 % (ref 37.5–51.0)
Hemoglobin: 15 g/dL (ref 13.0–17.7)
Immature Grans (Abs): 0 10*3/uL (ref 0.0–0.1)
Immature Granulocytes: 1 %
Lymphocytes Absolute: 1.7 10*3/uL (ref 0.7–3.1)
Lymphs: 20 %
MCH: 30.3 pg (ref 26.6–33.0)
MCHC: 32.3 g/dL (ref 31.5–35.7)
MCV: 94 fL (ref 79–97)
Monocytes Absolute: 0.8 10*3/uL (ref 0.1–0.9)
Monocytes: 9 %
Neutrophils Absolute: 5.8 10*3/uL (ref 1.4–7.0)
Neutrophils: 66 %
Platelets: 221 10*3/uL (ref 150–450)
RBC: 4.95 x10E6/uL (ref 4.14–5.80)
RDW: 13.1 % (ref 11.6–15.4)
WBC: 8.5 10*3/uL (ref 3.4–10.8)

## 2020-03-16 LAB — LIPID PANEL
Chol/HDL Ratio: 3.5 ratio (ref 0.0–5.0)
Cholesterol, Total: 113 mg/dL (ref 100–199)
HDL: 32 mg/dL — ABNORMAL LOW (ref >39)
LDL Chol Calc (NIH): 60 mg/dL (ref 0–99)
Triglycerides: 111 mg/dL (ref 0–149)
VLDL Cholesterol Cal: 21 mg/dL (ref 5–40)

## 2020-03-31 ENCOUNTER — Ambulatory Visit: Payer: PPO

## 2020-04-03 ENCOUNTER — Other Ambulatory Visit: Payer: Self-pay | Admitting: Family

## 2020-04-03 DIAGNOSIS — I10 Essential (primary) hypertension: Secondary | ICD-10-CM

## 2020-05-04 ENCOUNTER — Other Ambulatory Visit: Payer: Self-pay | Admitting: Family

## 2020-05-04 DIAGNOSIS — I2510 Atherosclerotic heart disease of native coronary artery without angina pectoris: Secondary | ICD-10-CM

## 2020-05-12 ENCOUNTER — Other Ambulatory Visit: Payer: Self-pay

## 2020-05-12 ENCOUNTER — Ambulatory Visit (INDEPENDENT_AMBULATORY_CARE_PROVIDER_SITE_OTHER): Payer: PPO

## 2020-05-12 DIAGNOSIS — Z23 Encounter for immunization: Secondary | ICD-10-CM | POA: Diagnosis not present

## 2020-05-12 NOTE — Progress Notes (Signed)
   Covid-19 Vaccination Clinic  Name:  Gregory Malone    MRN: [id number] DOB: 1950-09-26  05/12/2020  Mr. [name] was observed post Covid-19 immunization for 15 minutes without incident. He was provided with Vaccine Information Sheet and instruction to access the V-Safe system.   Mr. [name] was instructed to call 911 with any severe reactions post vaccine: Marland Kitchen Difficulty breathing  . Swelling of face and throat  . A fast heartbeat  . A bad rash all over body  . Dizziness and weakness   Immunizations Administered    Name Date Dose VIS Date Route   Moderna COVID-19 Vaccine 05/12/2020  9:25 AM 0.5 mL 04/07/2020 Intramuscular   Manufacturer: Moderna   Lot: [id number]   [place]: 55015-868-25

## 2020-05-27 ENCOUNTER — Other Ambulatory Visit: Payer: Self-pay | Admitting: Family

## 2020-06-02 ENCOUNTER — Ambulatory Visit (INDEPENDENT_AMBULATORY_CARE_PROVIDER_SITE_OTHER): Payer: PPO

## 2020-06-02 DIAGNOSIS — Z0000 Encounter for general adult medical examination without abnormal findings: Secondary | ICD-10-CM | POA: Diagnosis not present

## 2020-06-02 NOTE — Progress Notes (Signed)
MEDICARE ANNUAL WELLNESS VISIT  06/02/2020  Telephone Visit Disclaimer This Medicare AWV was conducted by telephone due to national recommendations for restrictions regarding the COVID-19 Pandemic (e.g. social distancing).  I verified, using two identifiers, that I am speaking with Gregory Malone or their authorized healthcare agent. I discussed the limitations, risks, security, and privacy concerns of performing an evaluation and management service by telephone and the potential availability of an in-person appointment in the future. The patient expressed understanding and agreed to proceed.  Location of Patient: Home Location of Provider (nurse):  [facility]  Subjective:    Gregory Malone is a 69 y.o. male patient of Hawks, Theador Hawthorne, FNP who had a Medicare Annual Wellness Visit today via telephone. Eshawn is Retired and lives alone. he has no children. he reports that he is socially active and does interact with friends/family regularly. he is minimally physically active and enjoys hunting relics.  Patient Care Team: Sharion Balloon, FNP as PCP - General (Family Medicine)  Advanced Directives 06/02/2020 10/06/2019 08/11/2019 06/02/2019 01/10/2018 11/28/2017 11/23/2016  Does Patient Have a Medical Advance Directive? No No Yes Yes No No No  Type of Advance Directive - - - Living will - - -  Does patient want to make changes to medical advance directive? - - - No - Patient declined - - -  Would patient like information on creating a medical advance directive? No - Patient declined No - Patient declined - No - Patient declined No - Patient declined - -    Hospital Utilization Over the Past 12 Months: # of hospitalizations or ER visits: 1 # of surgeries: 0  Review of Systems    Patient reports that his overall health is better compared to last year.  History obtained from chart review and the patient  Patient Reported Readings (BP, Pulse, CBG, Weight, etc) none  Pain  Assessment Pain : No/denies pain     Current Medications & Allergies (verified) Allergies as of 06/02/2020      Reactions   Nsaids    Told not to take because of CKD      Medication List       Accurate as of June 02, 2020  9:45 AM. If you have any questions, ask your nurse or doctor.        acetaminophen 500 MG tablet Commonly known as: TYLENOL Take 500 mg by mouth every 6 (six) hours as needed.   amLODipine 10 MG tablet Commonly known as: NORVASC TAKE 1 TABLET BY MOUTH EVERY DAY   atorvastatin 80 MG tablet Commonly known as: LIPITOR TAKE 1 TABLET BY MOUTH EVERY DAY   hydrochlorothiazide 25 MG tablet Commonly known as: HYDRODIURIL TAKE 1 TABLET BY MOUTH EVERY DAY   Klor-Con M20 20 MEQ tablet Generic drug: potassium chloride SA TAKE 1 TABLET BY MOUTH EVERY DAY   lisinopril 40 MG tablet Commonly known as: ZESTRIL TAKE 1 TABLET BY MOUTH EVERY DAY   metoprolol tartrate 25 MG tablet Commonly known as: LOPRESSOR TAKE 1 TABLET BY MOUTH TWICE A DAY       History (reviewed): Past Medical History:  Diagnosis Date  . Arthritis   . Asthma   . Bowel obstruction ([place])   . CAD (coronary artery disease)   . Chronic kidney disease    nephrolithiasis  couple episodes 10 years ago  . GERD (gastroesophageal reflux disease)   . Hx of colonic polyp   . Hypercholesterolemia   . Hypertension   .  Prostate cancer ([place]) 03/26/12 bx   ,gleason=3+3=6,PSA=4.22,volume=34cc  . Pulmonary nodule   . Radiation 06/27/2012-08/20/2012   prostate 7600 cGy 38 sessions   Past Surgical History:  Procedure Laterality Date  . COLONOSCOPY    . POLYPECTOMY    . PROSTATE BIOPSY  03/26/12   adenocarcinoma  . TONSILLECTOMY    . UPPER GASTROINTESTINAL ENDOSCOPY     Family History  Problem Relation Age of Onset  . Prostate cancer Father 36  . Hypertension Father   . Hyperlipidemia Father   . Prostate cancer Maternal Grandfather 40  . Prostate cancer Paternal Uncle   . Prostate  cancer Cousin   . Hypertension Mother   . Uterine cancer Sister   . Diabetes Sister   . Colon cancer Maternal Aunt   . Colon cancer Cousin        x 2  . Stomach cancer Neg Hx   . Rectal cancer Neg Hx   . Pancreatic cancer Neg Hx   . Esophageal cancer Neg Hx    Social History   Socioeconomic History  . Marital status: Divorced    Spouse name: Not on file  . Number of children: 0  . Years of education: 16  . Highest education level: Bachelor's degree (e.g., BA, AB, BS)  Occupational History  . 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 Smoking status: Former Smoker    Packs/day: 1.00    Years: 10.00    Pack years: 10.00    Types: Cigarettes    Quit date: 06/19/1992    Years since quitting: 27.9  . Smokeless tobacco: Never Used  . Tobacco comment: quit smoking in 1994  Vaping Use  . Vaping Use: Never used  Substance and Sexual Activity  . Alcohol use: Yes    Alcohol/week: 2.0 standard drinks    Types: 2 Cans of beer per week  . Drug use: No  . Sexual activity: Yes  Other Topics Concern  . Not on file  Social History Narrative  . Not on file   Social Determinants of Health   Financial Resource Strain: Low Risk   . Difficulty of Paying Living Expenses: Not hard at all  Food Insecurity: Not on file  Transportation Needs: No Transportation Needs  . Lack of Transportation (Medical): No  . Lack of Transportation (Non-Medical): No  Physical Activity: Inactive  . Days of Exercise per Week: 0 days  . Minutes of Exercise per Session: 0 min  Stress: No Stress Concern Present  . Feeling of Stress : Not at all  Social Connections: Moderately Isolated  . Frequency of Communication with Friends and Family: Three times a week  . Frequency of Social Gatherings with Friends and Family: More than three times a week  . Attends Religious Services: More than 4 times per year  . Active Member of Clubs or Organizations: No  . Attends English as a second language teacher Meetings: Never  . Marital Status: Divorced    Activities of Daily Living In your present state of health, do you have any difficulty performing the following activities: 06/02/2020  Hearing? N  Vision? N  Difficulty concentrating or making decisions? N  Walking or climbing stairs? N  Dressing or bathing? N  Doing errands, shopping? N  Preparing Food and eating ? N  Using the Toilet? N  In the past six months, have you accidently leaked urine? N  Do you have problems with loss of bowel control? N  Managing your Medications? N  Managing your Finances? N  Housekeeping or managing your Housekeeping? N  Some recent data might be hidden    Patient Education/ Literacy How often do you need to have someone help you when you read instructions, pamphlets, or other written materials from your doctor or pharmacy?: 1 - Never What is the last grade level you completed in school?: 4 year college degree  Exercise Current Exercise Habits: The patient does not participate in regular exercise at present, Exercise limited by: None identified  Diet Patient reports consuming 2 meals a day and 1 snack(s) a day Patient reports that his primary diet is: Regular Patient reports that she does have regular access to food.   Depression Screen PHQ 2/9 Scores 06/02/2020 03/15/2020 10/08/2019 06/02/2019 04/29/2019 07/30/2018 04/22/2018  PHQ - 2 Score 0 0 0 0 0 0 0     Fall Risk Fall Risk  06/02/2020 03/15/2020 10/08/2019 06/02/2019 04/29/2019  Falls in the past year? 0 0 0 0 0  Number falls in past yr: - - 0 - -  Injury with Fall? - - 0 - -  Risk for fall due to : - - No Fall Risks - -  Follow up Falls evaluation completed - Falls evaluation completed - -     Objective:  Gregory Malone seemed alert and oriented and he participated appropriately during our telephone visit.  Blood Pressure Weight BMI  BP Readings from Last 3 Encounters:  03/15/20 113/72  10/08/19 125/79  10/07/19  132/78   Wt Readings from Last 3 Encounters:  03/15/20 266 lb 6.4 oz (120.8 kg)  10/08/19 249 lb (112.9 kg)  10/06/19 250 lb (113.4 kg)   BMI Readings from Last 1 Encounters:  03/15/20 47.19 kg/m    *Unable to obtain current vital signs, weight, and BMI due to telephone visit type  Hearing/Vision  . Elise did not seem to have difficulty with hearing/understanding during the telephone conversation . Reports that he has not had a formal eye exam by an eye care professional within the past year . Reports that he has not had a formal hearing evaluation within the past year *Unable to fully assess hearing and vision during telephone visit type  Cognitive Function: 6CIT Screen 06/02/2020 06/02/2019  What Year? 0 points 0 points  What month? 0 points 0 points  What time? 0 points 0 points  Count back from 20 0 points 0 points  Months in reverse 0 points 0 points  Repeat phrase 0 points 0 points  Total Score 0 0   (Normal:0-7, Significant for Dysfunction: >8)  Normal Cognitive Function Screening: Yes   Immunization & Health Maintenance Record Immunization History  Administered Date(s) Administered  . Fluad Quad(high Dose 65+) 03/15/2020  . Moderna Sars-Covid-2 Vaccination 07/31/2019, 08/29/2019, 05/12/2020  . Pneumococcal Conjugate-13 01/10/2018  . Pneumococcal Polysaccharide-23 04/29/2019  . Tdap 04/22/2018    Health Maintenance  Topic Date Due  . COVID-19 Vaccine (4 - Booster for Moderna series) 11/09/2020  . COLONOSCOPY  11/28/2020  . TETANUS/TDAP  04/22/2028  . INFLUENZA VACCINE  Completed  . Hepatitis C Screening  Completed  . PNA vac Low Risk Adult  Completed       Assessment  This is a routine wellness examination for Gregory Malone.  Health Maintenance: Due or Overdue There are no preventive care reminders to display for this patient.  Gregory Malone does not need a referral for Community Assistance: Care Management:   no Social  Work:    no Prescription Assistance:  no Nutrition/Diabetes Education:  no   Plan:  Personalized Goals Goals Addressed            This Visit's Progress   . Patient Stated       06/02/2020 AWV Goal: Exercise for General Health   Patient will verbalize understanding of the benefits of increased physical activity:  Exercising regularly is important. It will improve your overall fitness, flexibility, and endurance.  Regular exercise also will improve your overall health. It can help you control your weight, reduce stress, and improve your bone density.  Over the next year, patient will increase physical activity as tolerated with a goal of at least 150 minutes of moderate physical activity per week.   You can tell that you are exercising at a moderate intensity if your heart starts beating faster and you start breathing faster but can still hold a conversation.  Moderate-intensity exercise ideas include:  Walking 1 mile (1.6 km) in about 15 minutes  Biking  Hiking  Golfing  Dancing  Water aerobics  Patient will verbalize understanding of everyday activities that increase physical activity by providing examples like the following: ? Yard work, such as: ? Pushing a Conservation officer, nature ? Raking and bagging leaves ? Washing your car ? Pushing a stroller ? Shoveling snow ? Gardening ? Washing windows or floors  Patient will be able to explain general safety guidelines for exercising:   Before you start a new exercise program, talk with your health care provider.  Do not exercise so much that you hurt yourself, feel dizzy, or get very short of breath.  Wear comfortable clothes and wear shoes with good support.  Drink plenty of water while you exercise to prevent dehydration or heat stroke.  Work out until your breathing and your heartbeat get faster.       Personalized Health Maintenance & Screening Recommendations  Advanced directives: has NO advanced directive - not  interested in additional information  Lung Cancer Screening Recommended: yes (Low Dose CT Chest recommended if Age 41-80 years, 30 pack-year currently smoking OR have quit w/in past 15 years) Hepatitis C Screening recommended: no HIV Screening recommended: no  Advanced Directives: Written information was not prepared per patient's request.  Referrals & Orders No orders of the defined types were placed in this encounter.   Follow-up Plan . Follow-up with Sharion Balloon, FNP as planned    I have personally reviewed and noted the following in the patient's chart:   . Medical and social history . Use of alcohol, tobacco or illicit drugs  . Current medications and supplements . Functional ability and status . Nutritional status . Physical activity . Advanced directives . List of other physicians . Hospitalizations, surgeries, and ER visits in previous 12 months . Vitals . Screenings to include cognitive, depression, and falls . Referrals and appointments  In addition, I have reviewed and discussed with Gregory Malone certain preventive protocols, quality metrics, and best practice recommendations. A written personalized care plan for preventive services as well as general preventive health recommendations is available and can be mailed to the patient at his request.      Felicity Coyer, LPN    [id number]   Patient declines after visit summary.

## 2020-06-08 ENCOUNTER — Other Ambulatory Visit: Payer: Self-pay | Admitting: Family

## 2020-06-08 DIAGNOSIS — I10 Essential (primary) hypertension: Secondary | ICD-10-CM

## 2020-06-22 ENCOUNTER — Other Ambulatory Visit: Payer: Self-pay | Admitting: Family

## 2020-06-22 DIAGNOSIS — E785 Hyperlipidemia, unspecified: Secondary | ICD-10-CM

## 2020-07-08 ENCOUNTER — Other Ambulatory Visit: Payer: Self-pay

## 2020-07-08 ENCOUNTER — Other Ambulatory Visit: Payer: PPO

## 2020-07-08 DIAGNOSIS — N200 Calculus of kidney: Secondary | ICD-10-CM | POA: Diagnosis not present

## 2020-07-08 DIAGNOSIS — I129 Hypertensive chronic kidney disease with stage 1 through stage 4 chronic kidney disease, or unspecified chronic kidney disease: Secondary | ICD-10-CM | POA: Diagnosis not present

## 2020-07-08 DIAGNOSIS — N1831 Chronic kidney disease, stage 3a: Secondary | ICD-10-CM | POA: Diagnosis not present

## 2020-07-08 DIAGNOSIS — N178 Other acute kidney failure: Secondary | ICD-10-CM | POA: Diagnosis not present

## 2020-07-15 DIAGNOSIS — N1831 Chronic kidney disease, stage 3a: Secondary | ICD-10-CM | POA: Diagnosis not present

## 2020-07-15 DIAGNOSIS — N200 Calculus of kidney: Secondary | ICD-10-CM | POA: Diagnosis not present

## 2020-07-15 DIAGNOSIS — I129 Hypertensive chronic kidney disease with stage 1 through stage 4 chronic kidney disease, or unspecified chronic kidney disease: Secondary | ICD-10-CM | POA: Diagnosis not present

## 2020-08-02 IMAGING — CT CT CHEST W/O CM
2 of 3 series · 15 of 36 positions shown, 18 images · non-contrast
Comparison: 10/19/2016.

CLINICAL DATA: Pulmonary nodules.

EXAM:
CT CHEST WITHOUT CONTRAST
TECHNIQUE: Multidetector CT imaging of the chest was performed following the
standard protocol without IV contrast.

[Series 2: thorax · axial · 0.88mm/px · z∈[-126,+102]mm · 12 of 134 slices shown, 15 images]
[im 10/134  mediastinal]
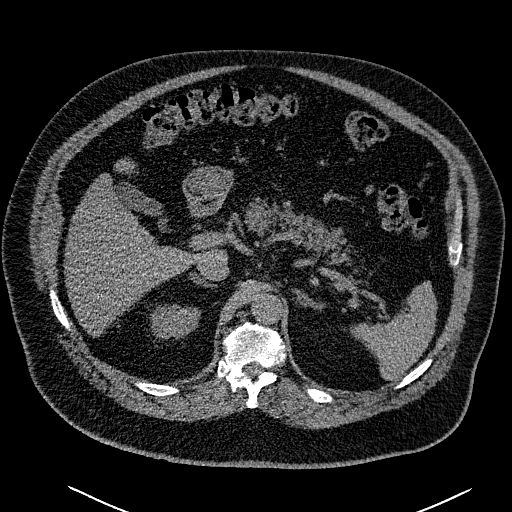
[im 10/134  lung]
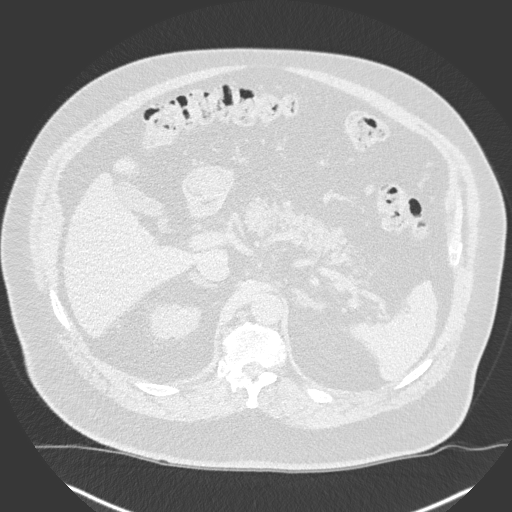
[im 20/134  lung]
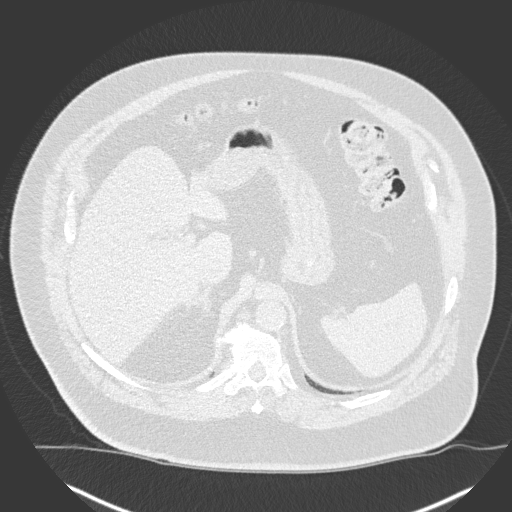
[im 30/134  lung]
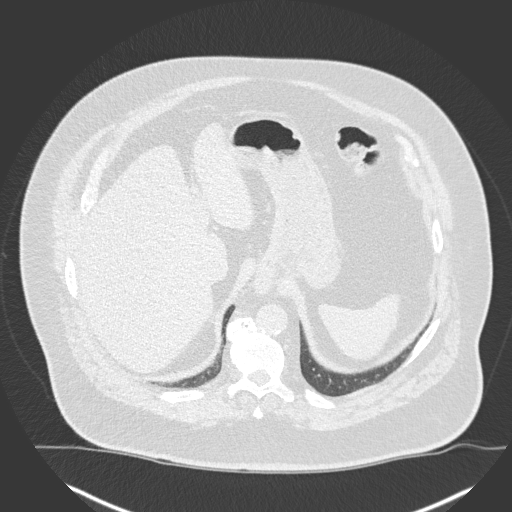
[im 40/134  lung]
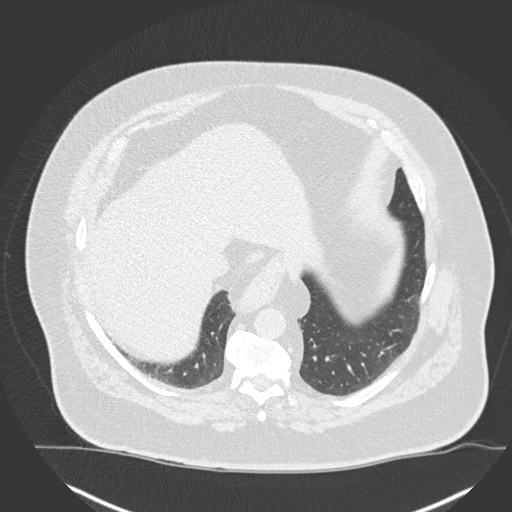
[im 50/134  mediastinal]
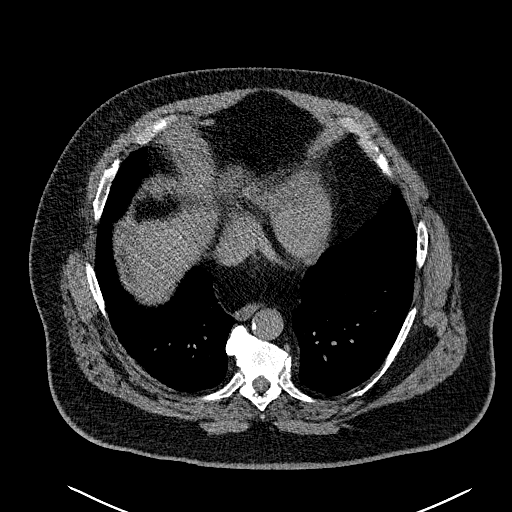
[im 50/134  lung]
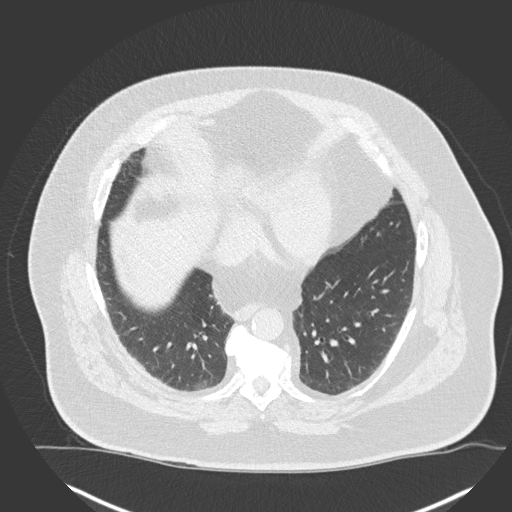
[im 60/134  lung]
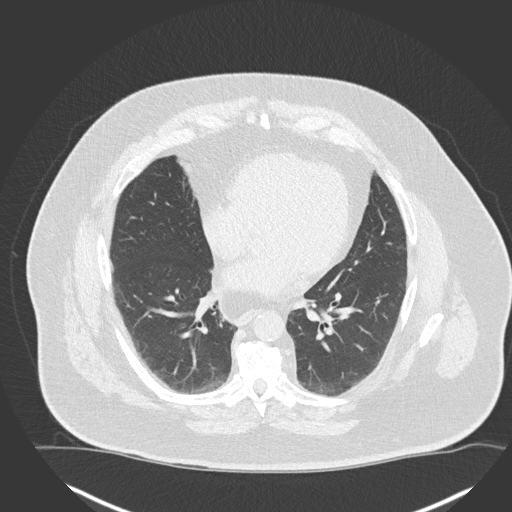
[im 74/134  lung]
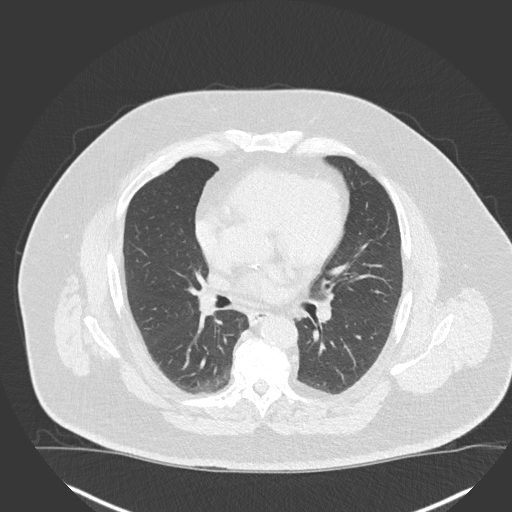
[im 84/134  lung]
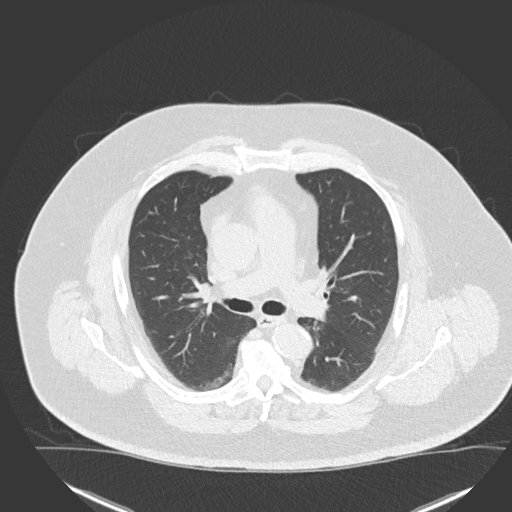
[im 94/134  mediastinal]
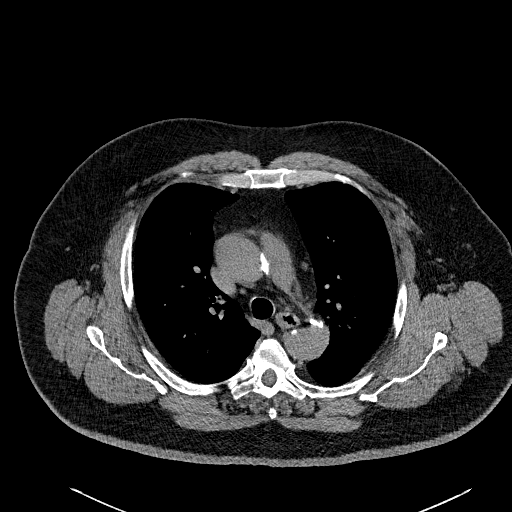
[im 94/134  lung]
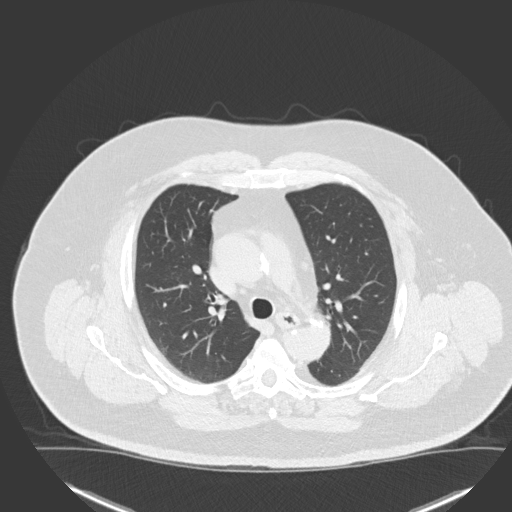
[im 104/134  lung]
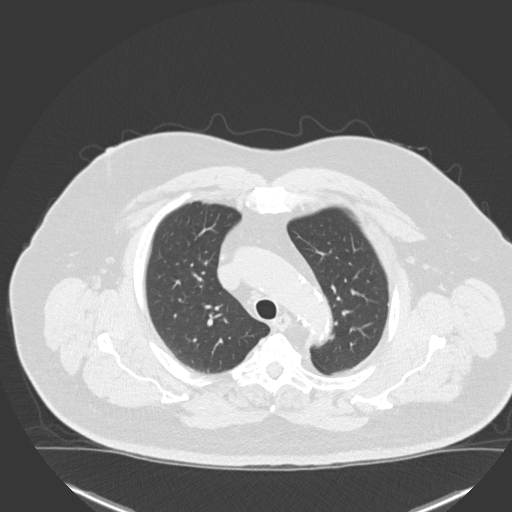
[im 114/134  lung]
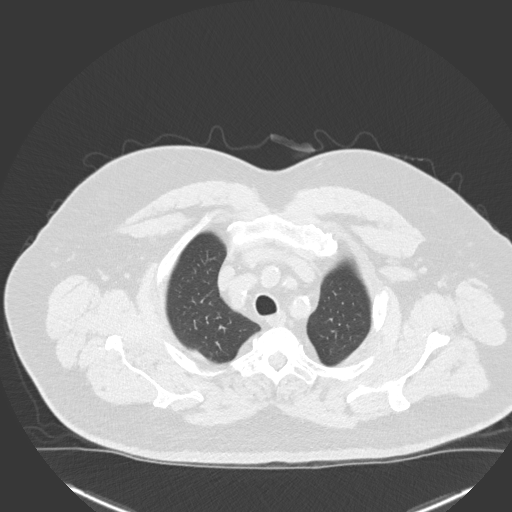
[im 124/134  lung]
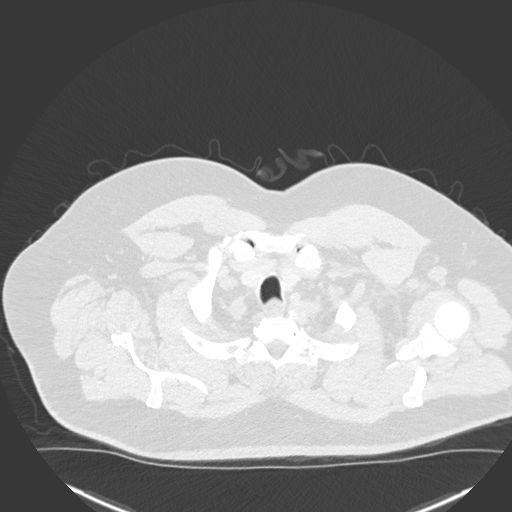

[Series 5: coronal · coronal · 0.56mm/px · 3 of 176 slices shown]
[im 36/176  lung]
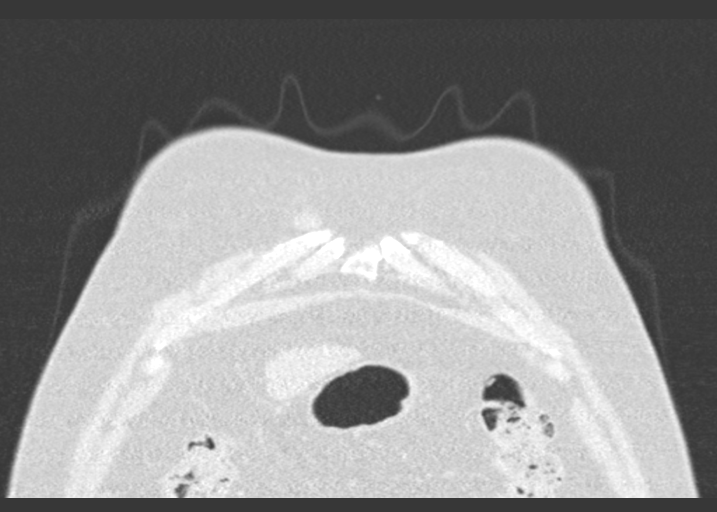
[im 71/176  lung]
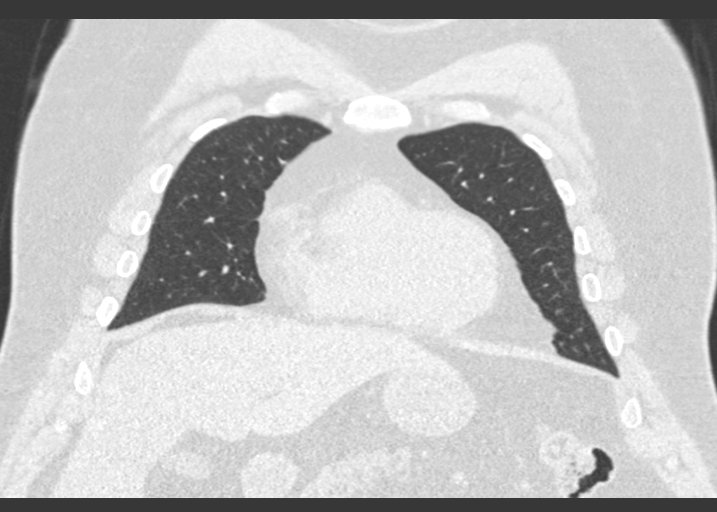
[im 106/176  lung]
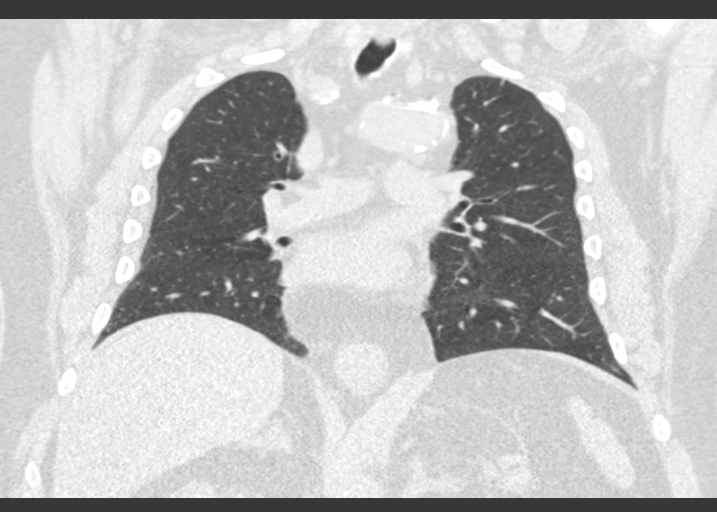

[15 of 36 positions shown; findings below may reference images not displayed]

FINDINGS: Cardiovascular: Atherosclerotic calcification of the arterial
vasculature, including coronary arteries. Pulmonic trunk is slightly
enlarged. Heart size normal. No pericardial effusion.

Mediastinum/Nodes: No pathologically enlarged mediastinal or
axillary lymph nodes. Hilar regions are difficult to evaluate
without IV contrast. Esophagus is grossly unremarkable. Small hiatal
hernia.

Lungs/Pleura: 3 mm pulmonary nodules are unchanged and considered
benign. No pleural fluid. Airway is unremarkable.

Upper Abdomen: Visualized portion of the liver is unremarkable.
Stones are seen in the gallbladder. Right adrenal gland is
unremarkable. 1.4 cm fat density myelolipoma in the lateral limb
left adrenal gland. Visualized portions of the kidneys, spleen,
pancreas, stomach and bowel are unremarkable with exception of a
small hiatal hernia. No upper abdominal adenopathy.

Musculoskeletal: Degenerative changes in the spine. No worrisome
lytic or sclerotic lesions.
IMPRESSION: 1. Tiny pulmonary nodules are unchanged and considered benign.
2. Cholelithiasis.
3. Aortic atherosclerosis (YHOD9-170.0). Coronary artery
calcification.
4. Enlarged pulmonic trunk, indicative of pulmonary arterial
hypertension.

## 2020-09-13 ENCOUNTER — Telehealth: Payer: Self-pay

## 2020-09-13 ENCOUNTER — Ambulatory Visit: Payer: PPO | Admitting: Family

## 2020-09-13 ENCOUNTER — Other Ambulatory Visit: Payer: Self-pay | Admitting: Family

## 2020-09-13 DIAGNOSIS — I10 Essential (primary) hypertension: Secondary | ICD-10-CM

## 2020-09-13 MED ORDER — LISINOPRIL 40 MG PO TABS: 40.0000 mg | ORAL_TABLET | Freq: Every day | ORAL | 0 refills | Status: DC

## 2020-09-13 MED ORDER — HYDROCHLOROTHIAZIDE 25 MG PO TABS: 25.0000 mg | ORAL_TABLET | Freq: Every day | ORAL | 0 refills | Status: DC

## 2020-09-13 MED ORDER — POTASSIUM CHLORIDE CRYS ER 20 MEQ PO TBCR: 20.0000 meq | EXTENDED_RELEASE_TABLET | Freq: Every day | ORAL | 0 refills | Status: DC

## 2020-09-13 NOTE — Telephone Encounter (Signed)
Pt aware refills sent to pharmacy 

## 2020-09-13 NOTE — Telephone Encounter (Signed)
  Prescription Request  09/13/2020  What is the name of the medication or equipment? Lisinopril 40 mg, Hydrochlorothiazide 25 mg, and Klor-Con M20 20 MEQ Tablet. Patient had appr with Christy 3-28 but she didn't come and has reschedule for 4-18.  Have you contacted your pharmacy to request a refill? (if applicable) YES  Which pharmacy would you like this sent to? [facility] in [place]  Patient notified that their request is being sent to the clinical staff for review and that they should receive a response within 2 business days.

## 2020-09-14 ENCOUNTER — Telehealth: Payer: Self-pay | Admitting: Family

## 2020-09-14 ENCOUNTER — Other Ambulatory Visit: Payer: Self-pay | Admitting: Family Medicine

## 2020-09-14 DIAGNOSIS — G8929 Other chronic pain: Secondary | ICD-10-CM

## 2020-09-14 DIAGNOSIS — E785 Hyperlipidemia, unspecified: Secondary | ICD-10-CM

## 2020-09-14 DIAGNOSIS — Z8546 Personal history of malignant neoplasm of prostate: Secondary | ICD-10-CM

## 2020-09-14 DIAGNOSIS — I10 Essential (primary) hypertension: Secondary | ICD-10-CM

## 2020-09-14 DIAGNOSIS — R7303 Prediabetes: Secondary | ICD-10-CM

## 2020-09-14 NOTE — Telephone Encounter (Signed)
Labs ordered.

## 2020-09-14 NOTE — Telephone Encounter (Signed)
REFERRAL REQUEST Telephone Note  Have you been seen at our office for this problem? Patient states he was seen for this last year. (Advise that they may need an appointment with their PCP before a referral can be done)  Reason for Referral: Back Pain that radiates to his hip Referral discussed with patient: yes Best contact number of patient for referral team:    Has patient been seen by a specialist for this issue before: Yes - Patient states he has been seen by Neurology/Neurosurgeon and has had injections before at that Office Patient provider preference for referral:  Patient location preference for referral: Patient would like to go back but does not remember the name of the Office.  Patient had an appt for Monday but it was changed to April.   Patient notified that referrals can take up to a week or longer to process. If they haven't heard anything within a week they should call back and speak with the referral department.

## 2020-09-15 NOTE — Telephone Encounter (Signed)
Referral placed.

## 2020-09-29 ENCOUNTER — Other Ambulatory Visit: Payer: Self-pay

## 2020-09-29 ENCOUNTER — Other Ambulatory Visit: Payer: PPO

## 2020-09-29 DIAGNOSIS — Z8546 Personal history of malignant neoplasm of prostate: Secondary | ICD-10-CM

## 2020-09-29 DIAGNOSIS — I10 Essential (primary) hypertension: Secondary | ICD-10-CM

## 2020-09-29 DIAGNOSIS — R7303 Prediabetes: Secondary | ICD-10-CM

## 2020-09-29 DIAGNOSIS — E785 Hyperlipidemia, unspecified: Secondary | ICD-10-CM

## 2020-09-29 LAB — BAYER DCA HB A1C WAIVED: HB A1C (BAYER DCA - WAIVED): 6.3 % (ref <7.0)

## 2020-09-30 LAB — CMP14+EGFR
ALT: 16 IU/L (ref 0–44)
AST: 18 IU/L (ref 0–40)
Albumin/Globulin Ratio: 1.2 (ref 1.2–2.2)
Albumin: 3.7 g/dL — ABNORMAL LOW (ref 3.8–4.8)
Alkaline Phosphatase: 100 IU/L (ref 44–121)
BUN/Creatinine Ratio: 13 (ref 10–24)
BUN: 16 mg/dL (ref 8–27)
Bilirubin Total: 0.4 mg/dL (ref 0.0–1.2)
CO2: 24 mmol/L (ref 20–29)
Calcium: 9.1 mg/dL (ref 8.6–10.2)
Chloride: 100 mmol/L (ref 96–106)
Creatinine, Ser: 1.24 mg/dL (ref 0.76–1.27)
Globulin, Total: 3 g/dL (ref 1.5–4.5)
Glucose: 116 mg/dL — ABNORMAL HIGH (ref 65–99)
Potassium: 3.9 mmol/L (ref 3.5–5.2)
Sodium: 143 mmol/L (ref 134–144)
Total Protein: 6.7 g/dL (ref 6.0–8.5)
eGFR: 63 mL/min/{1.73_m2} (ref >59)

## 2020-09-30 LAB — CBC WITH DIFFERENTIAL/PLATELET
Basophils Absolute: 0 10*3/uL (ref 0.0–0.2)
Basos: 0 %
EOS (ABSOLUTE): 0.2 10*3/uL (ref 0.0–0.4)
Eos: 2 %
Hematocrit: 44.4 % (ref 37.5–51.0)
Hemoglobin: 14.7 g/dL (ref 13.0–17.7)
Immature Grans (Abs): 0 10*3/uL (ref 0.0–0.1)
Immature Granulocytes: 0 %
Lymphocytes Absolute: 1.8 10*3/uL (ref 0.7–3.1)
Lymphs: 17 %
MCH: 29.8 pg (ref 26.6–33.0)
MCHC: 33.1 g/dL (ref 31.5–35.7)
MCV: 90 fL (ref 79–97)
Monocytes Absolute: 1 10*3/uL — ABNORMAL HIGH (ref 0.1–0.9)
Monocytes: 9 %
Neutrophils Absolute: 7.5 10*3/uL — ABNORMAL HIGH (ref 1.4–7.0)
Neutrophils: 72 %
Platelets: 257 10*3/uL (ref 150–450)
RBC: 4.93 x10E6/uL (ref 4.14–5.80)
RDW: 14.1 % (ref 11.6–15.4)
WBC: 10.5 10*3/uL (ref 3.4–10.8)

## 2020-09-30 LAB — LIPID PANEL
Chol/HDL Ratio: 3.5 ratio (ref 0.0–5.0)
Cholesterol, Total: 112 mg/dL (ref 100–199)
HDL: 32 mg/dL — ABNORMAL LOW (ref >39)
LDL Chol Calc (NIH): 57 mg/dL (ref 0–99)
Triglycerides: 131 mg/dL (ref 0–149)
VLDL Cholesterol Cal: 23 mg/dL (ref 5–40)

## 2020-09-30 LAB — PSA, TOTAL AND FREE
PSA, Free Pct: 20 %
PSA, Free: 0.06 ng/mL
Prostate Specific Ag, Serum: 0.3 ng/mL (ref 0.0–4.0)

## 2020-10-04 ENCOUNTER — Ambulatory Visit: Payer: PPO | Admitting: Family

## 2020-10-04 ENCOUNTER — Other Ambulatory Visit: Payer: Self-pay

## 2020-10-04 ENCOUNTER — Encounter: Payer: Self-pay | Admitting: Family

## 2020-10-04 ENCOUNTER — Ambulatory Visit (INDEPENDENT_AMBULATORY_CARE_PROVIDER_SITE_OTHER): Payer: PPO | Admitting: Family

## 2020-10-04 VITALS — BP 131/77 | HR 78 | Temp 97.3°F | Ht 63.0 in | Wt 259.2 lb

## 2020-10-04 DIAGNOSIS — M25551 Pain in right hip: Secondary | ICD-10-CM

## 2020-10-04 DIAGNOSIS — E785 Hyperlipidemia, unspecified: Secondary | ICD-10-CM | POA: Diagnosis not present

## 2020-10-04 DIAGNOSIS — Z8546 Personal history of malignant neoplasm of prostate: Secondary | ICD-10-CM

## 2020-10-04 DIAGNOSIS — N182 Chronic kidney disease, stage 2 (mild): Secondary | ICD-10-CM

## 2020-10-04 DIAGNOSIS — M549 Dorsalgia, unspecified: Secondary | ICD-10-CM

## 2020-10-04 DIAGNOSIS — K219 Gastro-esophageal reflux disease without esophagitis: Secondary | ICD-10-CM | POA: Diagnosis not present

## 2020-10-04 DIAGNOSIS — G8929 Other chronic pain: Secondary | ICD-10-CM

## 2020-10-04 DIAGNOSIS — I10 Essential (primary) hypertension: Secondary | ICD-10-CM

## 2020-10-04 NOTE — Progress Notes (Signed)
Subjective:    Patient ID: Gregory Malone, male    DOB: July 27, 1950, 70 y.o.   MRN: [id number]  Chief Complaint  Patient presents with  . Medical Management of Chronic Issues  . Hypertension  . Hip Pain    Right hip comes and goes    Pt presents to the office today for chronic follow up. Hehas hx ofProstate Cancer 2013.  Hehasbenign pulmonary nodulesthat are stable, last CT chest scan was 04/2019. Hefollowed by Nephrologists for CKD stage 2 every 6 months.  He has chronic back pain and right hip pain. He has a Publishing rights manager and had a MRI 07/30/19 that showed, "1. Multilevel spondylosis with disc bulging, endplate osteophytes and facet hypertrophy as described. 2. There is moderate right foraminal narrowing at L4-5 with possible right L4 and L5 nerve root encroachment. 3. Mild right foraminal narrowing at L5-S1 with possible right L5 nerve root encroachment within and beyond the canal. 4. Bilateral sacroiliac osteophytes with asymmetric subchondral edema on the right, likely secondary to chronic sacroiliitis. This could be further evaluated with pelvic MRI or CT if clinical concern of an acute process. " Hypertension This is a chronic problem. The current episode started more than 1 year ago. The problem has been resolved since onset. The problem is controlled. Pertinent negatives include no chest pain, malaise/fatigue, peripheral edema or shortness of breath. Risk factors for coronary artery disease include dyslipidemia, obesity, male gender and sedentary lifestyle. The current treatment provides moderate improvement. There is no history of CVA or heart failure.  Hip Pain  The incident occurred more than 1 week ago. There was no injury mechanism. The pain is present in the right hip. The pain is at a severity of 7/10. The pain is moderate. He reports no foreign bodies present. The symptoms are aggravated by movement. He has tried acetaminophen for the symptoms. The treatment  provided mild relief.  Gastroesophageal Reflux He complains of belching and heartburn. He reports no chest pain. The current episode started more than 1 year ago. The problem occurs rarely. The problem has been waxing and waning. Risk factors include obesity. He has tried a diet change for the symptoms. The treatment provided moderate relief.      Review of Systems  Constitutional: Negative for malaise/fatigue.  Respiratory: Negative for shortness of breath.   Cardiovascular: Negative for chest pain.  Gastrointestinal: Positive for heartburn.  All other systems reviewed and are negative.      Objective:   Physical Exam Vitals reviewed.  Constitutional:      General: He is not in acute distress.    Appearance: He is well-developed. He is obese.  HENT:     Head: Normocephalic.     Right Ear: Tympanic membrane normal.     Left Ear: Tympanic membrane normal.  Eyes:     General:        Right eye: No discharge.        Left eye: No discharge.     Pupils: Pupils are equal, round, and reactive to light.  Neck:     Thyroid: No thyromegaly.  Cardiovascular:     Rate and Rhythm: Normal rate and regular rhythm.     Heart sounds: Normal heart sounds. No murmur heard.   Pulmonary:     Effort: Pulmonary effort is normal. No respiratory distress.     Breath sounds: Normal breath sounds. No wheezing.  Abdominal:     General: Bowel sounds are normal. There is no distension.  Palpations: Abdomen is soft.     Tenderness: There is no abdominal tenderness.  Musculoskeletal:        General: No tenderness. Normal range of motion.     Cervical back: Normal range of motion and neck supple.  Skin:    General: Skin is warm and dry.     Findings: No erythema or rash.  Neurological:     Mental Status: He is alert and oriented to person, place, and time.     Cranial Nerves: No cranial nerve deficit.     Deep Tendon Reflexes: Reflexes are normal and symmetric.  Psychiatric:        Behavior:  Behavior normal.        Thought Content: Thought content normal.        Judgment: Judgment normal.          BP 131/77   Pulse 78   Temp (!) 97.3 F (36.3 C) (Temporal)   Ht 5\' 3"  (1.6 m)   Wt 259 lb 3.2 oz (117.6 kg)   BMI 45.92 kg/m   Assessment & Plan:  Gregory Malone comes in today with chief complaint of Medical Management of Chronic Issues, Hypertension, and Hip Pain (Right hip comes and goes )   Diagnosis and orders addressed:  1. Essential hypertension  2. Gastroesophageal reflux disease, unspecified whether esophagitis present  3. History of prostate cancer  4. Hyperlipidemia, unspecified hyperlipidemia type  5. Right hip pain  6. Chronic back pain, unspecified back location, unspecified back pain laterality  7. Chronic kidney failure, stage 2 (mild)   Labs pending Health Maintenance reviewed Diet and exercise encouraged  Follow up plan: 4 months    Gregory Dun, FNP

## 2020-10-04 NOTE — Patient Instructions (Signed)
Health Maintenance After Age 69 After age 69, you are at a higher risk for certain long-term diseases and infections as well as injuries from falls. Falls are a major cause of broken bones and head injuries in people who are older than age 69. Getting regular preventive care can help to keep you healthy and well. Preventive care includes getting regular testing and making lifestyle changes as recommended by your health care provider. Talk with your health care provider about:  Which screenings and tests you should have. A screening is a test that checks for a disease when you have no symptoms.  A diet and exercise plan that is right for you. What should I know about screenings and tests to prevent falls? Screening and testing are the best ways to find a health problem early. Early diagnosis and treatment give you the best chance of managing medical conditions that are common after age 69. Certain conditions and lifestyle choices may make you more likely to have a fall. Your health care provider may recommend:  Regular vision checks. Poor vision and conditions such as cataracts can make you more likely to have a fall. If you wear glasses, make sure to get your prescription updated if your vision changes.  Medicine review. Work with your health care provider to regularly review all of the medicines you are taking, including over-the-counter medicines. Ask your health care provider about any side effects that may make you more likely to have a fall. Tell your health care provider if any medicines that you take make you feel dizzy or sleepy.  Osteoporosis screening. Osteoporosis is a condition that causes the bones to get weaker. This can make the bones weak and cause them to break more easily.  Blood pressure screening. Blood pressure changes and medicines to control blood pressure can make you feel dizzy.  Strength and balance checks. Your health care provider may recommend certain tests to check your  strength and balance while standing, walking, or changing positions.  Foot health exam. Foot pain and numbness, as well as not wearing proper footwear, can make you more likely to have a fall.  Depression screening. You may be more likely to have a fall if you have a fear of falling, feel emotionally low, or feel unable to do activities that you used to do.  Alcohol use screening. Using too much alcohol can affect your balance and may make you more likely to have a fall. What actions can I take to lower my risk of falls? General instructions  Talk with your health care provider about your risks for falling. Tell your health care provider if: ? You fall. Be sure to tell your health care provider about all falls, even ones that seem minor. ? You feel dizzy, sleepy, or off-balance.  Take over-the-counter and prescription medicines only as told by your health care provider. These include any supplements.  Eat a healthy diet and maintain a healthy weight. A healthy diet includes low-fat dairy products, low-fat (lean) meats, and fiber from whole grains, beans, and lots of fruits and vegetables. Home safety  Remove any tripping hazards, such as rugs, cords, and clutter.  Install safety equipment such as grab bars in bathrooms and safety rails on stairs.  Keep rooms and walkways well-lit. Activity  Follow a regular exercise program to stay fit. This will help you maintain your balance. Ask your health care provider what types of exercise are appropriate for you.  If you need a cane or walker,   use it as recommended by your health care provider.  Wear supportive shoes that have nonskid soles.   Lifestyle  Do not drink alcohol if your health care provider tells you not to drink.  If you drink alcohol, limit how much you have: ? 0-1 drink a day for women. ? 0-2 drinks a day for men.  Be aware of how much alcohol is in your drink. In the [place] one drink equals one typical bottle of beer (12  oz), one-half glass of wine (5 oz), or one shot of hard liquor (1 oz).  Do not use any products that contain nicotine or tobacco, such as cigarettes and e-cigarettes. If you need help quitting, ask your health care provider. Summary  Having a healthy lifestyle and getting preventive care can help to protect your health and wellness after age 69.  Screening and testing are the best way to find a health problem early and help you avoid having a fall. Early diagnosis and treatment give you the best chance for managing medical conditions that are more common for people who are older than age 69.  Falls are a major cause of broken bones and head injuries in people who are older than age 69. Take precautions to prevent a fall at home.  Work with your health care provider to learn what changes you can make to improve your health and wellness and to prevent falls. This information is not intended to replace advice given to you by your health care provider. Make sure you discuss any questions you have with your health care provider. Document Revised: 09/26/2018 Document Reviewed: 04/18/2017 Elsevier Patient Education  2021 Elsevier Inc.  

## 2020-10-07 ENCOUNTER — Other Ambulatory Visit: Payer: Self-pay | Admitting: Family

## 2020-10-07 DIAGNOSIS — E785 Hyperlipidemia, unspecified: Secondary | ICD-10-CM

## 2020-10-11 DIAGNOSIS — M461 Sacroiliitis, not elsewhere classified: Secondary | ICD-10-CM | POA: Diagnosis not present

## 2020-10-11 DIAGNOSIS — Z6841 Body Mass Index (BMI) 40.0 and over, adult: Secondary | ICD-10-CM | POA: Diagnosis not present

## 2020-10-11 DIAGNOSIS — I10 Essential (primary) hypertension: Secondary | ICD-10-CM | POA: Diagnosis not present

## 2020-10-27 ENCOUNTER — Other Ambulatory Visit: Payer: Self-pay | Admitting: Family

## 2020-10-27 DIAGNOSIS — I10 Essential (primary) hypertension: Secondary | ICD-10-CM

## 2020-12-21 ENCOUNTER — Telehealth: Payer: Self-pay | Admitting: Family

## 2021-01-05 ENCOUNTER — Other Ambulatory Visit: Payer: Self-pay

## 2021-01-05 ENCOUNTER — Other Ambulatory Visit: Payer: PPO

## 2021-01-05 ENCOUNTER — Other Ambulatory Visit: Payer: Self-pay | Admitting: Family Medicine

## 2021-01-05 DIAGNOSIS — I129 Hypertensive chronic kidney disease with stage 1 through stage 4 chronic kidney disease, or unspecified chronic kidney disease: Secondary | ICD-10-CM | POA: Diagnosis not present

## 2021-01-05 DIAGNOSIS — N200 Calculus of kidney: Secondary | ICD-10-CM | POA: Diagnosis not present

## 2021-01-05 DIAGNOSIS — N1831 Chronic kidney disease, stage 3a: Secondary | ICD-10-CM | POA: Diagnosis not present

## 2021-01-05 DIAGNOSIS — Z1211 Encounter for screening for malignant neoplasm of colon: Secondary | ICD-10-CM

## 2021-01-05 NOTE — Progress Notes (Signed)
Referral for colonoscopy °

## 2021-01-12 DIAGNOSIS — N189 Chronic kidney disease, unspecified: Secondary | ICD-10-CM | POA: Diagnosis not present

## 2021-01-12 DIAGNOSIS — E1122 Type 2 diabetes mellitus with diabetic chronic kidney disease: Secondary | ICD-10-CM | POA: Diagnosis not present

## 2021-01-12 DIAGNOSIS — E870 Hyperosmolality and hypernatremia: Secondary | ICD-10-CM | POA: Diagnosis not present

## 2021-01-12 DIAGNOSIS — I129 Hypertensive chronic kidney disease with stage 1 through stage 4 chronic kidney disease, or unspecified chronic kidney disease: Secondary | ICD-10-CM | POA: Diagnosis not present

## 2021-01-12 NOTE — Telephone Encounter (Signed)
Dr. Vergia Alcon called and stated patient A1c came back at 6.9 and wanted [place] tame make patient and appointment to Discuss. Looks like patient called his self earlier today and made himself appt. Patient is a new diabetic FYI

## 2021-01-22 ENCOUNTER — Other Ambulatory Visit: Payer: Self-pay | Admitting: Family

## 2021-01-22 DIAGNOSIS — I10 Essential (primary) hypertension: Secondary | ICD-10-CM

## 2021-01-28 ENCOUNTER — Ambulatory Visit (INDEPENDENT_AMBULATORY_CARE_PROVIDER_SITE_OTHER): Payer: PPO | Admitting: Family

## 2021-01-28 ENCOUNTER — Other Ambulatory Visit: Payer: Self-pay

## 2021-01-28 ENCOUNTER — Encounter: Payer: Self-pay | Admitting: Family

## 2021-01-28 ENCOUNTER — Telehealth: Payer: Self-pay | Admitting: Family Medicine

## 2021-01-28 VITALS — BP 129/76 | HR 80 | Temp 97.8°F | Ht 63.0 in | Wt 260.0 lb

## 2021-01-28 DIAGNOSIS — I10 Essential (primary) hypertension: Secondary | ICD-10-CM | POA: Diagnosis not present

## 2021-01-28 DIAGNOSIS — K219 Gastro-esophageal reflux disease without esophagitis: Secondary | ICD-10-CM | POA: Diagnosis not present

## 2021-01-28 DIAGNOSIS — E785 Hyperlipidemia, unspecified: Secondary | ICD-10-CM | POA: Diagnosis not present

## 2021-01-28 DIAGNOSIS — E1169 Type 2 diabetes mellitus with other specified complication: Secondary | ICD-10-CM | POA: Diagnosis not present

## 2021-01-28 DIAGNOSIS — E669 Obesity, unspecified: Secondary | ICD-10-CM

## 2021-01-28 MED ORDER — BLOOD GLUCOSE METER KIT: PACK | 0 refills | Status: AC

## 2021-01-28 NOTE — Progress Notes (Signed)
Subjective:    Patient ID: Gregory Malone, male    DOB: [birth year], 70 y.o.   MRN: [id number]  Chief Complaint  Patient presents with   Diabetes    FOLLOW UP FROM DR. BHUTANI A1C 6.9   Pt presents to the office today for chronic follow up. He has hx of Prostate Cancer 2013.    He has benign pulmonary nodules that are stable, last CT chest scan was 04/2019. He followed by Nephrologists for CKD stage 2 every 6 months. Diabetes He presents for his follow-up diabetic visit. He has type 2 diabetes mellitus. There are no hypoglycemic associated symptoms. Pertinent negatives for diabetes include no blurred vision and no foot paresthesias. Symptoms are stable. Risk factors for coronary artery disease include dyslipidemia, diabetes mellitus, male sex, obesity, hypertension and sedentary lifestyle. He is following a generally unhealthy diet. (Does not check BS at home) Eye exam is not current.  Hypertension This is a chronic problem. The current episode started more than 1 year ago. The problem has been resolved since onset. The problem is controlled. Pertinent negatives include no blurred vision, malaise/fatigue, peripheral edema or shortness of breath. Risk factors for coronary artery disease include dyslipidemia, diabetes mellitus, obesity and sedentary lifestyle. The current treatment provides moderate improvement. Hypertensive end-organ damage includes kidney disease.  Gastroesophageal Reflux He complains of belching and heartburn. This is a chronic problem. The current episode started more than 1 year ago. The problem occurs occasionally. The problem has been waxing and waning. The symptoms are aggravated by certain foods. He has tried a PPI for the symptoms. The treatment provided moderate relief.  Hyperlipidemia This is a chronic problem. The current episode started more than 1 year ago. Exacerbating diseases include obesity. Pertinent negatives include no shortness of breath. Current  antihyperlipidemic treatment includes statins. The current treatment provides moderate improvement of lipids. Risk factors for coronary artery disease include dyslipidemia, male sex and hypertension.     Review of Systems  Constitutional:  Negative for malaise/fatigue.  Eyes:  Negative for blurred vision.  Respiratory:  Negative for shortness of breath.   Gastrointestinal:  Positive for heartburn.  All other systems reviewed and are negative.     Objective:   Physical Exam Vitals reviewed.  Constitutional:      General: He is not in acute distress.    Appearance: He is well-developed. He is obese.  HENT:     Head: Normocephalic.     Right Ear: Tympanic membrane normal.     Left Ear: Tympanic membrane normal.  Eyes:     General:        Right eye: No discharge.        Left eye: No discharge.     Pupils: Pupils are equal, round, and reactive to light.  Neck:     Thyroid: No thyromegaly.  Cardiovascular:     Rate and Rhythm: Normal rate and regular rhythm.     Heart sounds: Normal heart sounds. No murmur heard. Pulmonary:     Effort: Pulmonary effort is normal. No respiratory distress.     Breath sounds: Normal breath sounds. No wheezing.  Abdominal:     General: Bowel sounds are normal. There is no distension.     Palpations: Abdomen is soft.     Tenderness: There is no abdominal tenderness.  Musculoskeletal:        General: No tenderness. Normal range of motion.     Cervical back: Normal range of motion and neck supple.  Skin:    General: Skin is warm and dry.     Findings: No erythema or rash.  Neurological:     Mental Status: He is alert and oriented to person, place, and time.     Cranial Nerves: No cranial nerve deficit.     Deep Tendon Reflexes: Reflexes are normal and symmetric.  Psychiatric:        Behavior: Behavior normal.        Thought Content: Thought content normal.        Judgment: Judgment normal.      BP 129/76   Pulse 80   Temp 97.8 F (36.6  C) (Temporal)   Ht _0  (1.6 m)   Wt 260 lb (117.9 kg)   SpO2 93%   BMI 46.06 kg/m      Assessment & Plan:  Gregory Malone comes in today with chief complaint of Diabetes (FOLLOW UP FROM DR. BHUTANI A1C 6.9)   Diagnosis and orders addressed:  1. Essential hypertension  2. Gastroesophageal reflux disease, unspecified whether esophagitis present  3. Hyperlipidemia, unspecified hyperlipidemia type  4. Diabetes mellitus type 2 in obese (HCC) Strict low carb diet  Pt set appointment with Almyra Free for diabetic education - blood glucose meter kit and supplies; Dispense based on patient and insurance preference. Use up to four times daily as directed. (FOR ICD-10 E10.9, E11.9).  Dispense: 1 each; Refill: 0  5. Morbid obesity ([place])   Labs pending Health Maintenance reviewed Diet and exercise encouraged  Follow up plan: 3 months and needs follow up for diabetic education    Evelina Dun, [place]

## 2021-01-28 NOTE — Patient Instructions (Signed)
Diabetes Mellitus and Nutrition, Adult When you have diabetes, or diabetes mellitus, it is very important to have healthy eating habits because your blood sugar (glucose) levels are greatly affected by what you eat and drink. Eating healthy foods in the right amounts, at about the same times every day, can help you:  Control your blood glucose.  Lower your risk of heart disease.  Improve your blood pressure.  Reach or maintain a healthy weight. What can affect my meal plan? Every person with diabetes is different, and each person has different needs for a meal plan. Your health care provider may recommend that you work with a dietitian to make a meal plan that is best for you. Your meal plan may vary depending on factors such as:  The calories you need.  The medicines you take.  Your weight.  Your blood glucose, blood pressure, and cholesterol levels.  Your activity level.  Other health conditions you have, such as heart or kidney disease. How do carbohydrates affect me? Carbohydrates, also called carbs, affect your blood glucose level more than any other type of food. Eating carbs naturally raises the amount of glucose in your blood. Carb counting is a method for keeping track of how many carbs you eat. Counting carbs is important to keep your blood glucose at a healthy level, especially if you use insulin or take certain oral diabetes medicines. It is important to know how many carbs you can safely have in each meal. This is different for every person. Your dietitian can help you calculate how many carbs you should have at each meal and for each snack. How does alcohol affect me? Alcohol can cause a sudden decrease in blood glucose (hypoglycemia), especially if you use insulin or take certain oral diabetes medicines. Hypoglycemia can be a life-threatening condition. Symptoms of hypoglycemia, such as sleepiness, dizziness, and confusion, are similar to symptoms of having too much  alcohol.  Do not drink alcohol if: ? Your health care provider tells you not to drink. ? You are pregnant, may be pregnant, or are planning to become pregnant.  If you drink alcohol: ? Do not drink on an empty stomach. ? Limit how much you use to:  0-1 drink a day for women.  0-2 drinks a day for men. ? Be aware of how much alcohol is in your drink. In the [place] one drink equals one 12 oz bottle of beer (355 mL), one 5 oz glass of wine (148 mL), or one 1 oz glass of hard liquor (44 mL). ? Keep yourself hydrated with water, diet soda, or unsweetened iced tea.  Keep in mind that regular soda, juice, and other mixers may contain a lot of sugar and must be counted as carbs. What are tips for following this plan? Reading food labels  Start by checking the serving size on the "Nutrition Facts" label of packaged foods and drinks. The amount of calories, carbs, fats, and other nutrients listed on the label is based on one serving of the item. Many items contain more than one serving per package.  Check the total grams (g) of carbs in one serving. You can calculate the number of servings of carbs in one serving by dividing the total carbs by 15. For example, if a food has 30 g of total carbs per serving, it would be equal to 2 servings of carbs.  Check the number of grams (g) of saturated fats and trans fats in one serving. Choose foods that have   a low amount or none of these fats.  Check the number of milligrams (mg) of salt (sodium) in one serving. Most people should limit total sodium intake to less than 2,300 mg per day.  Always check the nutrition information of foods labeled as "low-fat" or "nonfat." These foods may be higher in added sugar or refined carbs and should be avoided.  Talk to your dietitian to identify your daily goals for nutrients listed on the label. Shopping  Avoid buying canned, pre-made, or processed foods. These foods tend to be high in fat, sodium, and added  sugar.  Shop around the outside edge of the grocery store. This is where you will most often find fresh fruits and vegetables, bulk grains, fresh meats, and fresh dairy. Cooking  Use low-heat cooking methods, such as baking, instead of high-heat cooking methods like deep frying.  Cook using healthy oils, such as olive, canola, or sunflower oil.  Avoid cooking with butter, cream, or high-fat meats. Meal planning  Eat meals and snacks regularly, preferably at the same times every day. Avoid going long periods of time without eating.  Eat foods that are high in fiber, such as fresh fruits, vegetables, beans, and whole grains. Talk with your dietitian about how many servings of carbs you can eat at each meal.  Eat 4-6 oz (112-168 g) of lean protein each day, such as lean meat, chicken, fish, eggs, or tofu. One ounce (oz) of lean protein is equal to: ? 1 oz (28 g) of meat, chicken, or fish. ? 1 egg. ?  cup (62 g) of tofu.  Eat some foods each day that contain healthy fats, such as avocado, nuts, seeds, and fish.   What foods should I eat? Fruits Berries. Apples. Oranges. Peaches. Apricots. Plums. Grapes. Mango. Papaya. Pomegranate. Kiwi. Cherries. Vegetables Lettuce. Spinach. Leafy greens, including kale, chard, collard greens, and mustard greens. Beets. Cauliflower. Cabbage. Broccoli. Carrots. Green beans. Tomatoes. Peppers. Onions. Cucumbers. Brussels sprouts. Grains Whole grains, such as whole-wheat or whole-grain bread, crackers, tortillas, cereal, and pasta. Unsweetened oatmeal. Quinoa. Brown or wild rice. Meats and other proteins Seafood. Poultry without skin. Lean cuts of poultry and beef. Tofu. Nuts. Seeds. Dairy Low-fat or fat-free dairy products such as milk, yogurt, and cheese. The items listed above may not be a complete list of foods and beverages you can eat. Contact a dietitian for more information. What foods should I avoid? Fruits Fruits canned with  syrup. Vegetables Canned vegetables. Frozen vegetables with butter or cream sauce. Grains Refined white flour and flour products such as bread, pasta, snack foods, and cereals. Avoid all processed foods. Meats and other proteins Fatty cuts of meat. Poultry with skin. Breaded or fried meats. Processed meat. Avoid saturated fats. Dairy Full-fat yogurt, cheese, or milk. Beverages Sweetened drinks, such as soda or iced tea. The items listed above may not be a complete list of foods and beverages you should avoid. Contact a dietitian for more information. Questions to ask a health care provider  Do I need to meet with a diabetes educator?  Do I need to meet with a dietitian?  What number can I call if I have questions?  When are the best times to check my blood glucose? Where to find more information:  American Diabetes Association: diabetes.org  Academy of Nutrition and Dietetics: www.eatright.org  National Institute of Diabetes and Digestive and Kidney Diseases: www.niddk.nih.gov  Association of Diabetes Care and Education Specialists: www.diabeteseducator.org Summary  It is important to have healthy eating   habits because your blood sugar (glucose) levels are greatly affected by what you eat and drink.  A healthy meal plan will help you control your blood glucose and maintain a healthy lifestyle.  Your health care provider may recommend that you work with a dietitian to make a meal plan that is best for you.  Keep in mind that carbohydrates (carbs) and alcohol have immediate effects on your blood glucose levels. It is important to count carbs and to use alcohol carefully. This information is not intended to replace advice given to you by your health care provider. Make sure you discuss any questions you have with your health care provider. Document Revised: 05/13/2019 Document Reviewed: 05/13/2019 Elsevier Patient Education  2021 Elsevier Inc.  

## 2021-02-03 NOTE — Telephone Encounter (Signed)
Ok

## 2021-02-10 ENCOUNTER — Telehealth: Payer: Self-pay | Admitting: Family

## 2021-02-10 DIAGNOSIS — E1169 Type 2 diabetes mellitus with other specified complication: Secondary | ICD-10-CM

## 2021-02-11 NOTE — Telephone Encounter (Signed)
Patient is medicare so we need a CCM referal please! They will schedule once received

## 2021-02-11 NOTE — Telephone Encounter (Signed)
Referral to CCM sent.

## 2021-02-15 ENCOUNTER — Telehealth: Payer: Self-pay

## 2021-02-15 NOTE — Chronic Care Management (AMB) (Signed)
  Chronic Care Management   Outreach Note  02/15/2021 Name: Gregory Malone MRN: [id number] DOB: 1950/08/30  Gregory Malone is a 70 y.o. year old male who is a primary care patient of Sharion Balloon, FNP. I reached out to Gregory Malone by phone today in response to a referral sent by Gregory Malone PCP, Sharion Balloon, FNP      An unsuccessful telephone outreach was attempted today. The patient was referred to the case management team for assistance with care management and care coordination.   Follow Up Plan: The care management team will reach out to the patient again over the next 3 days.  If patient returns call to provider office, please advise to call [facility]  at [address], [place], [place], [place] [address] Direct Dial: [phone] [email or web address]

## 2021-02-16 LAB — HM DIABETES EYE EXAM

## 2021-02-17 NOTE — Chronic Care Management (AMB) (Signed)
  Chronic Care Management   Note  02/17/2021 Name: MERVYN PFLAUM MRN: [id number] DOB: Jan 23, 1951  HANEEF HALLQUIST is a 70 y.o. year old male who is a primary care patient of Sharion Balloon, FNP. I reached out to Trude Mcburney by phone today in response to a referral sent by Mr. Sherren Kerns Duckett's PCP, Sharion Balloon, FNP      Mr. Printy was given information about Chronic Care Management services today including:  CCM service includes personalized support from designated clinical staff supervised by his physician, including individualized plan of care and coordination with other care providers 24/7 contact phone numbers for assistance for urgent and routine care needs. Service will only be billed when office clinical staff spend 20 minutes or more in a month to coordinate care. Only one practitioner may furnish and bill the service in a calendar month. The patient may stop CCM services at any time (effective at the end of the month) by phone call to the office staff. The patient will be responsible for cost sharing (co-pay) of up to 20% of the service fee (after annual deductible is met).  Patient agreed to services and verbal consent obtained.   Follow up plan: Face to Face appointment with care management team member scheduled for: 02/18/2021  Noreene Larsson, [place], [place], [place] [address] Direct Dial: [phone] [email or web address] Website: [email or web address]

## 2021-02-18 ENCOUNTER — Ambulatory Visit (INDEPENDENT_AMBULATORY_CARE_PROVIDER_SITE_OTHER): Payer: PPO | Admitting: Pharmacist

## 2021-02-18 ENCOUNTER — Other Ambulatory Visit: Payer: Self-pay

## 2021-02-18 VITALS — Wt 251.0 lb

## 2021-02-18 DIAGNOSIS — E669 Obesity, unspecified: Secondary | ICD-10-CM

## 2021-02-18 DIAGNOSIS — E1169 Type 2 diabetes mellitus with other specified complication: Secondary | ICD-10-CM | POA: Diagnosis not present

## 2021-02-18 NOTE — Progress Notes (Signed)
Chronic Care Management Pharmacy Note  02/18/2021 Name:  Gregory Malone MRN:  [id number] DOB:  [birth year]  Summary: t2dm  Recommendations/Changes made from today's visit: Diabetes: Uncontrolled; current treatment: lifestyle/dietary changes;  Set up new meter for patient One touch verio--patient able to teach back No current medications Patient has lost over 20lbs and is making great progress Current glucose readings: set up new glucometer--patient to start testing (fasting) Discussed meal planning options and Plate method for healthy eating Avoid sugary drinks and desserts Incorporate balanced protein, non starchy veggies, 1 serving of carbohydrate with each meal Increase water intake Increase physical activity as able Current exercise: n/a Educated on new glucometer, patient to consider GLP1 therapy-could assist with weight loss & other health benefits  Follow Up Plan: Telephone follow up appointment with care management team member scheduled for: 6 weeks  Subjective: Gregory Malone is an 70 y.o. year old male who is a primary patient of Sharion Balloon, FNP.  The CCM team was consulted for assistance with disease management and care coordination needs.    Engaged with patient face to face for initial visit in response to provider referral for pharmacy case management and/or care coordination services.   Consent to Services:  The patient was given information about Chronic Care Management services, agreed to services, and gave verbal consent prior to initiation of services.  Please see initial visit note for detailed documentation.   Patient Care Team: Sharion Balloon, FNP as PCP - General (Family Medicine) Lavera Guise, [facility] as Pharmacist (Family Medicine)  Objective:  Lab Results  Component Value Date   CREATININE 1.24 09/29/2020   CREATININE 1.26 03/15/2020   CREATININE 1.64 (H) 10/06/2019    Lab Results  Component Value Date   HGBA1C 6.3 09/29/2020    Last diabetic Eye exam:  Lab Results  Component Value Date/Time   HMDIABEYEEXA No Retinopathy 02/16/2021 12:00 AM    Last diabetic Foot exam: No results found for: HMDIABFOOTEX      Component Value Date/Time   CHOL 112 09/29/2020 1050   TRIG 131 09/29/2020 1050   HDL 32 (L) 09/29/2020 1050   CHOLHDL 3.5 09/29/2020 1050   LDLCALC 57 09/29/2020 1050    Hepatic Function Latest Ref Rng & Units 09/29/2020 03/15/2020 04/29/2019  Total Protein 6.0 - 8.5 g/dL 6.7 6.5 7.0  Albumin 3.8 - 4.8 g/dL 3.7(L) 4.0 3.8  AST 0 - 40 IU/L $Remov'18 22 19  'EtCqSp$ ALT 0 - 44 IU/L $Remov'16 22 16  'VogDxZ$ Alk Phosphatase 44 - 121 IU/L 100 104 86  Total Bilirubin 0.0 - 1.2 mg/dL 0.4 0.3 0.5    Lab Results  Component Value Date/Time   TSH 2.700 04/03/2014 02:28 PM   TSH 2.420 02/27/2013 10:34 AM    CBC Latest Ref Rng & Units 09/29/2020 03/15/2020 10/06/2019  WBC 3.4 - 10.8 x10E3/uL 10.5 8.5 10.9(H)  Hemoglobin 13.0 - 17.7 g/dL 14.7 15.0 14.5  Hematocrit 37.5 - 51.0 % 44.4 46.4 44.7  Platelets 150 - 450 x10E3/uL 257 221 181    No results found for: VD25OH  Clinical ASCVD: No  The ASCVD Risk score (Arnett DK, et al., 2019) failed to calculate for the following reasons:   The valid total cholesterol range is 130 to 320 mg/dL    Other: (CHADS2VASc if Afib, PHQ9 if depression, MMRC or CAT for COPD, ACT, DEXA)  Social History   Tobacco Use  Smoking Status Former   Packs/day: 1.00   Years: 10.00  Pack years: 10.00   Types: Cigarettes   Quit date: 06/19/1992   Years since quitting: 28.7  Smokeless Tobacco Never  Tobacco Comments   quit smoking in 1994   BP Readings from Last 3 Encounters:  01/28/21 129/76  10/04/20 131/77  03/15/20 113/72   Pulse Readings from Last 3 Encounters:  01/28/21 80  10/04/20 78  03/15/20 87   Wt Readings from Last 3 Encounters:  02/18/21 251 lb (113.9 kg)  01/28/21 260 lb (117.9 kg)  10/04/20 259 lb 3.2 oz (117.6 kg)    Assessment: Review of patient past medical history,  allergies, medications, health status, including review of consultants reports, laboratory and other test data, was performed as part of comprehensive evaluation and provision of chronic care management services.   SDOH:  (Social Determinants of Health) assessments and interventions performed:    CCM Care Plan  Allergies  Allergen Reactions   Nsaids     Told not to take because of CKD    Medications Reviewed Today     Reviewed by Lavera Guise, [facility] (Pharmacist) on 02/18/21 at [phone]  Med List Status: <None>   Medication Order Taking? Sig Documenting Provider Last Dose Status Informant  acetaminophen (TYLENOL) 500 MG tablet [id number] No Take 500 mg by mouth every 6 (six) hours as needed. [provider] Taking Active   amLODipine (NORVASC) 10 MG tablet [id number] No TAKE 1 TABLET BY MOUTH EVERY DAY Sharion Balloon, FNP Taking Active   atorvastatin (LIPITOR) 80 MG tablet [id number] No TAKE 1 TABLET BY MOUTH EVERY DAY Sharion Balloon, FNP Taking Active   blood glucose meter kit and supplies [id number]  Dispense based on patient and insurance preference. Use up to four times daily as directed. (FOR ICD-10 E10.9, E11.9). Evelina Dun A, FNP  Active   hydrochlorothiazide (HYDRODIURIL) 25 MG tablet [id number] No TAKE 1 TABLET (25 MG TOTAL) BY MOUTH DAILY. Sharion Balloon, FNP Taking Active   KLOR-CON M20 20 MEQ tablet [id number] No TAKE 1 TABLET BY MOUTH EVERY DAY Sharion Balloon, FNP Taking Active   lisinopril (ZESTRIL) 40 MG tablet [id number] No TAKE 1 TABLET BY MOUTH EVERY DAY Hawks, Christy A, FNP Taking Active   metoprolol tartrate (LOPRESSOR) 25 MG tablet [id number] No TAKE 1 TABLET BY MOUTH TWICE A DAY Sharion Balloon, FNP Taking Active             Patient Active Problem List   Diagnosis Date Noted   Right hip pain 10/04/2020   History of prostate cancer 07/30/2019   Pulmonary nodules 05/07/2019   Gastroesophageal reflux disease 10/26/2016   Hematemesis without nausea  10/26/2016   Atypical chest pain 10/26/2016   Hemoptysis 10/03/2016   Diabetes mellitus type 2 in obese ([place]) 12/27/2015   Essential hypertension 04/21/2015   Hyperlipidemia 04/21/2015   Morbid obesity ([place]) 04/21/2015    Immunization History  Administered Date(s) Administered   Fluad Quad(high Dose 65+) 03/15/2020   Moderna Sars-Covid-2 Vaccination 07/31/2019, 08/29/2019, 05/12/2020   Pneumococcal Conjugate-13 01/10/2018   Pneumococcal Polysaccharide-23 04/29/2019   Tdap 04/22/2018    Conditions to be addressed/monitored: DMII  Care Plan : PHARMD MEDICATION MANAGEMENT  Updates made by Lavera Guise, [place] since 02/27/2021 12:00 AM     Problem: DISEASE PROGRESSION PREVENTION      Long-Range Goal: T2DM   This Visit's Progress: Not on track  Priority: High  Note:   Current Barriers:  Unable to maintain control of DIABETES/PRE-NEW DIAGNOSIS   Pharmacist Clinical Goal(s):  Over the next 90 days, patient will maintain control of T2DM as evidenced by IMPROVED GLYCEMIC CONTROL  through collaboration with PharmD and provider.    Interventions: 1:1 collaboration with Sharion Balloon, FNP regarding development and update of comprehensive plan of care as evidenced by provider attestation and co-signature Inter-disciplinary care team collaboration (see longitudinal plan of care) Comprehensive medication review performed; medication list updated in electronic medical record  Diabetes: Uncontrolled; current treatment: lifestyle/dietary changes;  Set up new meter for patient One touch verio--patient able to teach back No current medications Patient has lost over 20lbs and is making great progress Current glucose readings: set up new glucometer--patient to start testing (fasting) Discussed meal planning options and Plate method for healthy eating Avoid sugary drinks and desserts Incorporate balanced protein, non starchy veggies, 1 serving of carbohydrate with each meal Increase  water intake Increase physical activity as able Current exercise: n/a Educated on new glucometer, patient to consider GLP1 therapy-could assist with weight loss & other health benefits   Patient Goals/Self-Care Activities Over the next 90 days, patient will:  - take medications as prescribed check glucose 3x weekly (fasting), document, and provide at future appointments  Follow Up Plan: Telephone follow up appointment with care management team member scheduled for: 6 weeks      Medication Assistance: None required.  Patient affirms current coverage meets needs.  Patient's preferred pharmacy is:  [id number] - [place], [place] [address] Phone: [phone] Fax: [phone]  [id number] - [place], [place] [address] Phone: [phone] Fax: [phone]  Follow Up:  Patient agrees to Care Plan and Follow-up.  Plan: Telephone follow up appointment with care management team member scheduled for:  6 weeks  Regina Eck, PharmD, BCPS Clinical Pharmacist, [place]  II Phone [phone]

## 2021-02-19 ENCOUNTER — Other Ambulatory Visit: Payer: Self-pay | Admitting: Family

## 2021-02-27 NOTE — Patient Instructions (Signed)
Visit Information  PATIENT GOALS:  Goals Addressed               This Visit's Progress     Patient Stated     T2DM PHARMD GOAL (pt-stated)        Current Barriers:  Unable to maintain control of DIABETES/PRE-NEW DIAGNOSIS   Pharmacist Clinical Goal(s):  Over the next 90 days, patient will maintain control of T2DM as evidenced by IMPROVED GLYCEMIC CONTROL  through collaboration with PharmD and provider.    Interventions: 1:1 collaboration with Sharion Balloon, FNP regarding development and update of comprehensive plan of care as evidenced by provider attestation and co-signature Inter-disciplinary care team collaboration (see longitudinal plan of care) Comprehensive medication review performed; medication list updated in electronic medical record  Diabetes: Uncontrolled; current treatment: lifestyle/dietary changes;  Set up new meter for patient One touch verio--patient able to teach back No current medications Patient has lost over 20lbs and is making great progress Current glucose readings: set up new glucometer--patient to start testing (fasting) Discussed meal planning options and Plate method for healthy eating Avoid sugary drinks and desserts Incorporate balanced protein, non starchy veggies, 1 serving of carbohydrate with each meal Increase water intake Increase physical activity as able Current exercise: n/a Educated on new glucometer, patient to consider GLP1 therapy-could assist with weight loss & other health benefits   Patient Goals/Self-Care Activities Over the next 90 days, patient will:  - take medications as prescribed check glucose 3x weekly (fasting), document, and provide at future appointments  Follow Up Plan: Telephone follow up appointment with care management team member scheduled for: 6 weeks         The patient verbalized understanding of instructions, educational materials, and care plan provided today and declined offer to receive copy  of patient instructions, educational materials, and care plan.   Telephone follow up appointment with care management team member scheduled for: 6 weeks  Signature Regina Eck, PharmD, BCPS Clinical Pharmacist, [place]  II Phone [phone]

## 2021-03-16 ENCOUNTER — Encounter: Payer: Self-pay | Admitting: Gastroenterology

## 2021-03-18 DIAGNOSIS — E1169 Type 2 diabetes mellitus with other specified complication: Secondary | ICD-10-CM | POA: Diagnosis not present

## 2021-03-18 DIAGNOSIS — E669 Obesity, unspecified: Secondary | ICD-10-CM | POA: Diagnosis not present

## 2021-04-05 ENCOUNTER — Ambulatory Visit: Payer: PPO | Admitting: Family

## 2021-04-06 ENCOUNTER — Telehealth: Payer: Self-pay | Admitting: Pharmacist

## 2021-04-06 ENCOUNTER — Telehealth: Payer: PPO

## 2021-04-06 NOTE — Telephone Encounter (Signed)
  PHARMD CCM   Follow Up Note   04/06/2021 Name: Gregory Malone MRN: [id number] DOB: Jun 18, 1951   Referred by: Sharion Balloon, FNP Reason for referral : DIABETES Medication Management (UNSUCCESSFUL OUTREACH)   An unsuccessful telephone outreach was attempted today. The patient was referred to the case management team for assistance with care management and care coordination.   Follow Up Plan: Telephone follow up appointment with care management team member scheduled for:04/2021     SIGNATURE Regina Eck, PharmD, BCPS Clinical Pharmacist, [place]  II Phone [phone]

## 2021-04-11 ENCOUNTER — Other Ambulatory Visit: Payer: Self-pay | Admitting: Family

## 2021-04-11 DIAGNOSIS — E785 Hyperlipidemia, unspecified: Secondary | ICD-10-CM

## 2021-04-19 ENCOUNTER — Other Ambulatory Visit: Payer: Self-pay | Admitting: Family

## 2021-04-19 DIAGNOSIS — E785 Hyperlipidemia, unspecified: Secondary | ICD-10-CM

## 2021-04-19 DIAGNOSIS — I10 Essential (primary) hypertension: Secondary | ICD-10-CM

## 2021-04-20 ENCOUNTER — Encounter: Payer: Self-pay | Admitting: Gastroenterology

## 2021-05-02 ENCOUNTER — Encounter: Payer: Self-pay | Admitting: Family

## 2021-05-02 ENCOUNTER — Other Ambulatory Visit: Payer: Self-pay

## 2021-05-02 ENCOUNTER — Ambulatory Visit (INDEPENDENT_AMBULATORY_CARE_PROVIDER_SITE_OTHER): Payer: PPO | Admitting: Family

## 2021-05-02 VITALS — BP 111/64 | HR 77 | Temp 98.2°F | Ht 63.0 in | Wt 243.6 lb

## 2021-05-02 DIAGNOSIS — E1169 Type 2 diabetes mellitus with other specified complication: Secondary | ICD-10-CM | POA: Diagnosis not present

## 2021-05-02 DIAGNOSIS — I10 Essential (primary) hypertension: Secondary | ICD-10-CM | POA: Diagnosis not present

## 2021-05-02 DIAGNOSIS — I129 Hypertensive chronic kidney disease with stage 1 through stage 4 chronic kidney disease, or unspecified chronic kidney disease: Secondary | ICD-10-CM | POA: Diagnosis not present

## 2021-05-02 DIAGNOSIS — N189 Chronic kidney disease, unspecified: Secondary | ICD-10-CM | POA: Diagnosis not present

## 2021-05-02 DIAGNOSIS — E785 Hyperlipidemia, unspecified: Secondary | ICD-10-CM | POA: Diagnosis not present

## 2021-05-02 DIAGNOSIS — K219 Gastro-esophageal reflux disease without esophagitis: Secondary | ICD-10-CM

## 2021-05-02 DIAGNOSIS — E1122 Type 2 diabetes mellitus with diabetic chronic kidney disease: Secondary | ICD-10-CM | POA: Diagnosis not present

## 2021-05-02 DIAGNOSIS — E870 Hyperosmolality and hypernatremia: Secondary | ICD-10-CM | POA: Diagnosis not present

## 2021-05-02 DIAGNOSIS — E669 Obesity, unspecified: Secondary | ICD-10-CM | POA: Diagnosis not present

## 2021-05-02 LAB — BAYER DCA HB A1C WAIVED: HB A1C (BAYER DCA - WAIVED): 5.6 % (ref 4.8–5.6)

## 2021-05-02 LAB — CMP14+EGFR
ALT: 13 IU/L (ref 0–44)
AST: 18 IU/L (ref 0–40)
Albumin/Globulin Ratio: 1.3 (ref 1.2–2.2)
Albumin: 4.2 g/dL (ref 3.8–4.8)
Alkaline Phosphatase: 95 IU/L (ref 44–121)
BUN/Creatinine Ratio: 20 (ref 10–24)
BUN: 28 mg/dL — ABNORMAL HIGH (ref 8–27)
Bilirubin Total: 0.4 mg/dL (ref 0.0–1.2)
CO2: 26 mmol/L (ref 20–29)
Calcium: 9.8 mg/dL (ref 8.6–10.2)
Chloride: 101 mmol/L (ref 96–106)
Creatinine, Ser: 1.38 mg/dL — ABNORMAL HIGH (ref 0.76–1.27)
Globulin, Total: 3.2 g/dL (ref 1.5–4.5)
Glucose: 109 mg/dL — ABNORMAL HIGH (ref 70–99)
Potassium: 4.3 mmol/L (ref 3.5–5.2)
Sodium: 143 mmol/L (ref 134–144)
Total Protein: 7.4 g/dL (ref 6.0–8.5)
eGFR: 56 mL/min/{1.73_m2} — ABNORMAL LOW (ref >59)

## 2021-05-02 NOTE — Progress Notes (Signed)
Subjective:    Patient ID: Gregory Malone, male    DOB: 10-28-1950, 70 y.o.   MRN: [id number]  Chief Complaint  Patient presents with   Medical Management of Chronic Issues   Pt presents to the office today for chronic follow up. He has hx of Prostate Cancer 2013.    He has benign pulmonary nodules that are stable, last CT chest scan was 04/2019. He followed by Nephrologists for CKD stage 2 every 6 months. Diabetes He presents for his follow-up diabetic visit. He has type 2 diabetes mellitus. There are no hypoglycemic associated symptoms. Pertinent negatives for diabetes include no blurred vision and no foot paresthesias. Symptoms are stable. Diabetic complications include nephropathy. Pertinent negatives for diabetic complications include no heart disease. Risk factors for coronary artery disease include diabetes mellitus, male sex, hypertension, obesity and dyslipidemia. He is following a generally unhealthy diet. His overall blood glucose range is 110-130 mg/dl. Eye exam is current.  Hypertension This is a chronic problem. The current episode started more than 1 year ago. The problem has been resolved since onset. The problem is controlled. Pertinent negatives include no blurred vision, malaise/fatigue, peripheral edema or shortness of breath. Risk factors for coronary artery disease include dyslipidemia, diabetes mellitus, obesity and male gender. The current treatment provides moderate improvement. Hypertensive end-organ damage includes kidney disease. There is no history of heart failure.  Gastroesophageal Reflux He complains of belching and heartburn. This is a chronic problem. The current episode started more than 1 year ago. The problem occurs rarely. The problem has been resolved. He has tried a diet change for the symptoms. The treatment provided significant relief.  Hyperlipidemia This is a chronic problem. The current episode started more than 1 year ago. Exacerbating diseases  include obesity. Pertinent negatives include no shortness of breath. Current antihyperlipidemic treatment includes statins. The current treatment provides moderate improvement of lipids. Risk factors for coronary artery disease include dyslipidemia, diabetes mellitus, male sex, hypertension and a sedentary lifestyle.     Review of Systems  Constitutional:  Negative for malaise/fatigue.  Eyes:  Negative for blurred vision.  Respiratory:  Negative for shortness of breath.   Gastrointestinal:  Positive for heartburn.  All other systems reviewed and are negative.     Objective:   Physical Exam Vitals reviewed.  Constitutional:      General: He is not in acute distress.    Appearance: He is well-developed. He is obese.  HENT:     Head: Normocephalic.     Right Ear: Tympanic membrane normal.     Left Ear: Tympanic membrane normal.  Eyes:     General:        Right eye: No discharge.        Left eye: No discharge.     Pupils: Pupils are equal, round, and reactive to light.  Neck:     Thyroid: No thyromegaly.  Cardiovascular:     Rate and Rhythm: Normal rate and regular rhythm.     Heart sounds: Normal heart sounds. No murmur heard. Pulmonary:     Effort: Pulmonary effort is normal. No respiratory distress.     Breath sounds: Normal breath sounds. No wheezing.  Abdominal:     General: Bowel sounds are normal. There is no distension.     Palpations: Abdomen is soft.     Tenderness: There is no abdominal tenderness.  Musculoskeletal:        General: No tenderness. Normal range of motion.  Cervical back: Normal range of motion and neck supple.  Skin:    General: Skin is warm and dry.     Findings: No erythema or rash.  Neurological:     Mental Status: He is alert and oriented to person, place, and time.     Cranial Nerves: No cranial nerve deficit.     Deep Tendon Reflexes: Reflexes are normal and symmetric.  Psychiatric:        Behavior: Behavior normal.        Thought  Content: Thought content normal.        Judgment: Judgment normal.   Diabetic Foot Exam - Simple   Simple Foot Form Diabetic Foot exam was performed with the following findings: Yes 05/02/2021 10:36 AM  Visual Inspection No deformities, no ulcerations, no other skin breakdown bilaterally: Yes Sensation Testing Intact to touch and monofilament testing bilaterally: Yes Pulse Check Posterior Tibialis and Dorsalis pulse intact bilaterally: Yes Comments       BP 111/64   Pulse 77   Temp 98.2 F (36.8 C) (Temporal)   Ht $R'5\' 3"'Lf$  (1.6 m)   Wt 243 lb 9.6 oz (110.5 kg)   BMI 43.15 kg/m      Assessment & Plan:  CHESKY HEYER comes in today with chief complaint of Medical Management of Chronic Issues   Diagnosis and orders addressed:  1. Essential hypertension - CMP14+EGFR - CBC with Differential/Platelet  2. Gastroesophageal reflux disease, unspecified whether esophagitis present - CMP14+EGFR - CBC with Differential/Platelet  3. Diabetes mellitus type 2 in obese ([facility]) - CMP14+EGFR - CBC with Differential/Platelet - Bayer DCA Hb A1c Waived  4. Hyperlipidemia, unspecified hyperlipidemia type - CMP14+EGFR - CBC with Differential/Platelet  5. Morbid obesity ([place]) - CMP14+EGFR - CBC with Differential/Platelet   Labs pending Health Maintenance reviewed Diet and exercise encouraged  Follow up plan: 6 months   Evelina Dun, FNP

## 2021-05-02 NOTE — Patient Instructions (Signed)
Health Maintenance After Age 69 After age 69, you are at a higher risk for certain long-term diseases and infections as well as injuries from falls. Falls are a major cause of broken bones and head injuries in people who are older than age 69. Getting regular preventive care can help to keep you healthy and well. Preventive care includes getting regular testing and making lifestyle changes as recommended by your health care provider. Talk with your health care provider about: Which screenings and tests you should have. A screening is a test that checks for a disease when you have no symptoms. A diet and exercise plan that is right for you. What should I know about screenings and tests to prevent falls? Screening and testing are the best ways to find a health problem early. Early diagnosis and treatment give you the best chance of managing medical conditions that are common after age 69. Certain conditions and lifestyle choices may make you more likely to have a fall. Your health care provider may recommend: Regular vision checks. Poor vision and conditions such as cataracts can make you more likely to have a fall. If you wear glasses, make sure to get your prescription updated if your vision changes. Medicine review. Work with your health care provider to regularly review all of the medicines you are taking, including over-the-counter medicines. Ask your health care provider about any side effects that may make you more likely to have a fall. Tell your health care provider if any medicines that you take make you feel dizzy or sleepy. Strength and balance checks. Your health care provider may recommend certain tests to check your strength and balance while standing, walking, or changing positions. Foot health exam. Foot pain and numbness, as well as not wearing proper footwear, can make you more likely to have a fall. Screenings, including: Osteoporosis screening. Osteoporosis is a condition that causes  the bones to get weaker and break more easily. Blood pressure screening. Blood pressure changes and medicines to control blood pressure can make you feel dizzy. Depression screening. You may be more likely to have a fall if you have a fear of falling, feel depressed, or feel unable to do activities that you used to do. Alcohol use screening. Using too much alcohol can affect your balance and may make you more likely to have a fall. Follow these instructions at home: Lifestyle Do not drink alcohol if: Your health care provider tells you not to drink. If you drink alcohol: Limit how much you have to: 0-1 drink a day for women. 0-2 drinks a day for men. Know how much alcohol is in your drink. In the [place] one drink equals one 12 oz bottle of beer (355 mL), one 5 oz glass of wine (148 mL), or one 1 oz glass of hard liquor (44 mL). Do not use any products that contain nicotine or tobacco. These products include cigarettes, chewing tobacco, and vaping devices, such as e-cigarettes. If you need help quitting, ask your health care provider. Activity  Follow a regular exercise program to stay fit. This will help you maintain your balance. Ask your health care provider what types of exercise are appropriate for you. If you need a cane or walker, use it as recommended by your health care provider. Wear supportive shoes that have nonskid soles. Safety  Remove any tripping hazards, such as rugs, cords, and clutter. Install safety equipment such as grab bars in bathrooms and safety rails on stairs. Keep rooms and walkways   well-lit. General instructions Talk with your health care provider about your risks for falling. Tell your health care provider if: You fall. Be sure to tell your health care provider about all falls, even ones that seem minor. You feel dizzy, tiredness (fatigue), or off-balance. Take over-the-counter and prescription medicines only as told by your health care provider. These include  supplements. Eat a healthy diet and maintain a healthy weight. A healthy diet includes low-fat dairy products, low-fat (lean) meats, and fiber from whole grains, beans, and lots of fruits and vegetables. Stay current with your vaccines. Schedule regular health, dental, and eye exams. Summary Having a healthy lifestyle and getting preventive care can help to protect your health and wellness after age 69. Screening and testing are the best way to find a health problem early and help you avoid having a fall. Early diagnosis and treatment give you the best chance for managing medical conditions that are more common for people who are older than age 69. Falls are a major cause of broken bones and head injuries in people who are older than age 69. Take precautions to prevent a fall at home. Work with your health care provider to learn what changes you can make to improve your health and wellness and to prevent falls. This information is not intended to replace advice given to you by your health care provider. Make sure you discuss any questions you have with your health care provider. Document Revised: 10/25/2020 Document Reviewed: 10/25/2020 Elsevier Patient Education  2022 [employer]  

## 2021-05-15 ENCOUNTER — Other Ambulatory Visit: Payer: Self-pay | Admitting: Family

## 2021-05-15 DIAGNOSIS — I2510 Atherosclerotic heart disease of native coronary artery without angina pectoris: Secondary | ICD-10-CM

## 2021-05-18 DIAGNOSIS — N200 Calculus of kidney: Secondary | ICD-10-CM | POA: Diagnosis not present

## 2021-05-18 DIAGNOSIS — N189 Chronic kidney disease, unspecified: Secondary | ICD-10-CM | POA: Diagnosis not present

## 2021-05-18 DIAGNOSIS — I129 Hypertensive chronic kidney disease with stage 1 through stage 4 chronic kidney disease, or unspecified chronic kidney disease: Secondary | ICD-10-CM | POA: Diagnosis not present

## 2021-05-18 DIAGNOSIS — E1122 Type 2 diabetes mellitus with diabetic chronic kidney disease: Secondary | ICD-10-CM | POA: Diagnosis not present

## 2021-05-25 ENCOUNTER — Telehealth: Payer: PPO

## 2021-06-03 ENCOUNTER — Ambulatory Visit (INDEPENDENT_AMBULATORY_CARE_PROVIDER_SITE_OTHER): Payer: PPO

## 2021-06-03 VITALS — Ht 63.0 in | Wt 243.0 lb

## 2021-06-03 DIAGNOSIS — Z0000 Encounter for general adult medical examination without abnormal findings: Secondary | ICD-10-CM | POA: Diagnosis not present

## 2021-06-03 NOTE — Progress Notes (Signed)
Subjective:   Gregory Malone is a 70 y.o. male who presents for Medicare Annual/Subsequent preventive examination.  Virtual Visit via Telephone Note  I connected with  Gregory Malone on 06/03/21 at 10:30 AM EST by telephone and verified that I am speaking with the correct person using two identifiers.  Location: Patient: Home Provider: [facility] Persons participating in the virtual visit: patient/Nurse Health Advisor   I discussed the limitations, risks, security and privacy concerns of performing an evaluation and management service by telephone and the availability of in person appointments. The patient expressed understanding and agreed to proceed.  Interactive audio and video telecommunications were attempted between this nurse and patient, however failed, due to patient having technical difficulties OR patient did not have access to video capability.  We continued and completed visit with audio only.  Some vital signs may be absent or patient reported.   Maranatha Grossi E Mychelle Kendra, LPN   Review of Systems     Cardiac Risk Factors include: advanced age (>15men, >72 women);male gender;obesity (BMI >30kg/m2);family history of premature cardiovascular disease;dyslipidemia;hypertension;Other (see comment), Risk factor comments: arteriosclerosis, asthma     Objective:    Today's Vitals   06/03/21 1046  Weight: 243 lb (110.2 kg)  Height: $Remove'5\' 3"'ocmpxwd$  (1.6 m)   Body mass index is 43.05 kg/m.  Advanced Directives 06/03/2021 06/02/2020 10/06/2019 08/11/2019 06/02/2019 01/10/2018 11/28/2017  Does Patient Have a Medical Advance Directive? Yes No No Yes Yes No No  Type of Paramedic of [place];Living will - - - Living will - -  Does patient want to make changes to medical advance directive? - - - - No - Patient declined - -  Copy of [place] in Chart? Yes - validated most recent copy scanned in chart (See row information) - - - - - -  Would patient like  information on creating a medical advance directive? - No - Patient declined No - Patient declined - No - Patient declined No - Patient declined -    Current Medications (verified) Outpatient Encounter Medications as of 06/03/2021  Medication Sig   acetaminophen (TYLENOL) 500 MG tablet Take 500 mg by mouth every 6 (six) hours as needed.   amLODipine (NORVASC) 10 MG tablet TAKE 1 TABLET BY MOUTH EVERY DAY   atorvastatin (LIPITOR) 80 MG tablet TAKE 1 TABLET BY MOUTH EVERY DAY   hydrochlorothiazide (HYDRODIURIL) 25 MG tablet TAKE 1 TABLET (25 MG TOTAL) BY MOUTH DAILY.   KLOR-CON M20 20 MEQ tablet TAKE 1 TABLET BY MOUTH EVERY DAY   lisinopril (ZESTRIL) 40 MG tablet TAKE 1 TABLET BY MOUTH EVERY DAY   metoprolol tartrate (LOPRESSOR) 25 MG tablet TAKE 1 TABLET BY MOUTH TWICE A DAY   blood glucose meter kit and supplies Dispense based on patient and insurance preference. Use up to four times daily as directed. (FOR ICD-10 E10.9, E11.9).   ONETOUCH VERIO test strip USE TO CHECK SUGAR UP TO 4 TIMES DAILY ICD-10 E10.9, E11.9   No facility-administered encounter medications on file as of 06/03/2021.    Allergies (verified) Nsaids   History: Past Medical History:  Diagnosis Date   Arthritis    Asthma    Bowel obstruction (HCC)    CAD (coronary artery disease)    Chronic kidney disease    nephrolithiasis  couple episodes 10 years ago   GERD (gastroesophageal reflux disease)    Hx of colonic polyp    Hypercholesterolemia    Hypertension    Prostate  cancer ([place]) 03/26/12 bx   ,gleason=3+3=6,PSA=4.22,volume=34cc   Pulmonary nodule    Radiation 06/27/2012-08/20/2012   prostate 7600 cGy 38 sessions   Past Surgical History:  Procedure Laterality Date   COLONOSCOPY     POLYPECTOMY     PROSTATE BIOPSY  03/26/12   adenocarcinoma   TONSILLECTOMY     UPPER GASTROINTESTINAL ENDOSCOPY     Family History  Problem Relation Age of Onset   Prostate cancer Father 64   Hypertension Father     Hyperlipidemia Father    Prostate cancer Maternal Grandfather 25   Prostate cancer Paternal Uncle    Prostate cancer Cousin    Hypertension Mother    Uterine cancer Sister    Diabetes Sister    Colon cancer Maternal Aunt    Colon cancer Cousin        x 2   Stomach cancer Neg Hx    Rectal cancer Neg Hx    Pancreatic cancer Neg Hx    Esophageal cancer Neg Hx    Social History   Socioeconomic History   Marital status: Divorced    Spouse name: Not on file   Number of children: 0   Years of education: 16   Highest education level: Bachelor's degree (e.g., BA, AB, BS)  Occupational History   Occupation: retired/[employer]    Comment: Retired Media planner  Tobacco Use   Smoking status: Former    Packs/day: 1.00    Years: 10.00    Pack years: 10.00    Types: Cigarettes    Quit date: 06/19/1992    Years since quitting: 28.9   Smokeless tobacco: Never   Tobacco comments:    quit smoking in 1994  Vaping Use   Vaping Use: Never used  Substance and Sexual Activity   Alcohol use: Yes    Alcohol/week: 2.0 standard drinks    Types: 2 Cans of beer per week   Drug use: No   Sexual activity: Yes  Other Topics Concern   Not on file  Social History Narrative   Lives alone   Enjoys hiking and metal detector hunting   Changed to a Paleo Diet mid 2022 and no longer has diabetes   Social Determinants of Radio broadcast assistant Strain: Low Risk    Difficulty of Paying Living Expenses: Not hard at all  Food Insecurity: No Food Insecurity   Worried About Charity fundraiser in the Last Year: Never true   [place] in the Last Year: Never true  Transportation Needs: No Transportation Needs   Lack of Transportation (Medical): No   Lack of Transportation (Non-Medical): No  Physical Activity: Sufficiently Active   Days of Exercise per Week: 5 days   Minutes of Exercise per Session: 60 min  Stress: No Stress Concern Present   Feeling of Stress : Not at all  Social  Connections: Moderately Isolated   Frequency of Communication with Friends and Family: Three times a week   Frequency of Social Gatherings with Friends and Family: More than three times a week   Attends Religious Services: Never   Marine scientist or Organizations: Yes   Attends Music therapist: More than 4 times per year   Marital Status: Divorced    Tobacco Counseling Counseling given: Not Answered Tobacco comments: quit smoking in 1994   Clinical Intake:  Pre-visit preparation completed: Yes  Pain : No/denies pain     BMI - recorded: 43.05 Nutritional Status:  BMI > 30  Obese Nutritional Risks: None Diabetes: No  How often do you need to have someone help you when you read instructions, pamphlets, or other written materials from your doctor or pharmacy?: 1 - Never  Diabetic? no  Interpreter Needed?: No  Information entered by :: Jozey Janco, LPN   Activities of Daily Living In your present state of health, do you have any difficulty performing the following activities: 06/03/2021  Hearing? N  Vision? N  Difficulty concentrating or making decisions? N  Walking or climbing stairs? N  Dressing or bathing? N  Doing errands, shopping? N  Preparing Food and eating ? N  Using the Toilet? N  In the past six months, have you accidently leaked urine? N  Do you have problems with loss of bowel control? N  Managing your Medications? N  Managing your Finances? N  Housekeeping or managing your Housekeeping? N  Some recent data might be hidden    Patient Care Team: Gregory Balloon, FNP as PCP - General (Family Medicine) Gregory Malone, [facility] as Pharmacist (Family Medicine) Gregory Malone, [place] (Optometry)  Indicate any recent Medical Services you may have received from other than [facility] providers in the past year (date may be approximate).     Assessment:   This is a routine wellness examination for Gregory Malone.  Hearing/Vision screen Hearing Screening  - Comments:: Denies hearing difficulties  Vision Screening - Comments:: Wears rx glasses prn, usually when driving only - Up to date with annual eye exams with [facility]  Dietary issues and exercise activities discussed: Current Exercise Habits: Home exercise routine, Type of exercise: walking;Other - see comments (hiking), Time (Minutes): 60, Frequency (Times/Week): 3, Weekly Exercise (Minutes/Week): 180, Intensity: Moderate, Exercise limited by: respiratory conditions(s);orthopedic condition(s)   Goals Addressed               This Visit's Progress     DIET - INCREASE WATER INTAKE   On track     Try to drink 6-8 glasses of water daily      Patient Stated   On track     06/02/2020 AWV Goal: Exercise for General Health  Patient will verbalize understanding of the benefits of increased physical activity: Exercising regularly is important. It will improve your overall fitness, flexibility, and endurance. Regular exercise also will improve your overall health. It can help you control your weight, reduce stress, and improve your bone density. Over the next year, patient will increase physical activity as tolerated with a goal of at least 150 minutes of moderate physical activity per week.  You can tell that you are exercising at a moderate intensity if your heart starts beating faster and you start breathing faster but can still hold a conversation. Moderate-intensity exercise ideas include: Walking 1 mile (1.6 km) in about 15 minutes Biking Hiking Golfing Dancing Water aerobics Patient will verbalize understanding of everyday activities that increase physical activity by providing examples like the following: Yard work, such as: Sales promotion account executive Gardening Washing windows or floors Patient will be able to explain general safety guidelines for exercising:  Before you start a new exercise  program, talk with your health care provider. Do not exercise so much that you hurt yourself, feel dizzy, or get very short of breath. Wear comfortable clothes and wear shoes with good support. Drink plenty of water while you exercise to prevent dehydration or heat stroke. Work out  until your breathing and your heartbeat get faster.       T2DM PHARMD GOAL (pt-stated)   On track     Current Barriers:  Unable to maintain control of DIABETES/PRE-NEW DIAGNOSIS   Pharmacist Clinical Goal(s):  Over the next 90 days, patient will maintain control of T2DM as evidenced by IMPROVED GLYCEMIC CONTROL  through collaboration with PharmD and provider.    Interventions: 1:1 collaboration with Gregory Balloon, FNP regarding development and update of comprehensive plan of care as evidenced by provider attestation and co-signature Inter-disciplinary care team collaboration (see longitudinal plan of care) Comprehensive medication review performed; medication list updated in electronic medical record  Diabetes: Uncontrolled; current treatment: lifestyle/dietary changes;  Set up new meter for patient One touch verio--patient able to teach back No current medications Patient has lost over 20lbs and is making great progress Current glucose readings: set up new glucometer--patient to start testing (fasting) Discussed meal planning options and Plate method for healthy eating Avoid sugary drinks and desserts Incorporate balanced protein, non starchy veggies, 1 serving of carbohydrate with each meal Increase water intake Increase physical activity as able Current exercise: n/a Educated on new glucometer, patient to consider GLP1 therapy-could assist with weight loss & other health benefits   Patient Goals/Self-Care Activities Over the next 90 days, patient will:  - take medications as prescribed check glucose 3x weekly (fasting), document, and provide at future appointments  Follow Up Plan: Telephone  follow up appointment with care management team member scheduled for: 6 weeks        Depression Screen PHQ 2/9 Scores 06/03/2021 05/02/2021 01/28/2021 10/04/2020 06/02/2020 03/15/2020 10/08/2019  PHQ - 2 Score 0 0 0 0 0 0 0    Fall Risk Fall Risk  06/03/2021 05/02/2021 01/28/2021 10/04/2020 06/02/2020  Falls in the past year? 0 0 0 0 0  Number falls in past yr: 0 - - - -  Injury with Fall? 0 - - - -  Risk for fall due to : No Fall Risks - - - -  Follow up Falls prevention discussed - - - Falls evaluation completed    FALL RISK PREVENTION PERTAINING TO THE HOME:  Any stairs in or around the home? Yes  If so, are there any without handrails? No  Home free of loose throw rugs in walkways, pet beds, electrical cords, etc? Yes  Adequate lighting in your home to reduce risk of falls? Yes   ASSISTIVE DEVICES UTILIZED TO PREVENT FALLS:  Life alert? No  Use of a cane, walker or w/c? No  Grab bars in the bathroom? Yes  Shower chair or bench in shower? No  Elevated toilet seat or a handicapped toilet? Yes   TIMED UP AND GO:  Was the test performed? No . Telephonic visit   Cognitive Function: Normal cognitive status assessed by direct observation by this Nurse Health Advisor. No abnormalities found.    MMSE - Mini Mental State Exam 01/10/2018  Orientation to time 5  Orientation to Place 5  Registration 3  Attention/ Calculation 5  Recall 2  Language- name 2 objects 2  Language- repeat 1  Language- follow 3 step command 3  Language- read & follow direction 1  Write a sentence 1  Copy design 1  Total score 29     6CIT Screen 06/02/2020 06/02/2019  What Year? 0 points 0 points  What month? 0 points 0 points  What time? 0 points 0 points  Count back from 20 0 points 0  points  Months in reverse 0 points 0 points  Repeat phrase 0 points 0 points  Total Score 0 0    Immunizations Immunization History  Administered Date(s) Administered   Fluad Quad(high Dose 65+) 03/15/2020    Moderna Sars-Covid-2 Vaccination 07/31/2019, 08/29/2019, 05/12/2020   Pneumococcal Conjugate-13 01/10/2018   Pneumococcal Polysaccharide-23 04/29/2019   Tdap 04/22/2018    TDAP status: Up to date  Flu Vaccine status: Declined, Education has been provided regarding the importance of this vaccine but patient still declined. Advised may receive this vaccine at local pharmacy or Health Dept. Aware to provide a copy of the vaccination record if obtained from local pharmacy or Health Dept. Verbalized acceptance and understanding.  Pneumococcal vaccine status: Up to date  Covid-19 vaccine status: Completed vaccines  Qualifies for Shingles Vaccine? Yes   Zostavax completed No   Shingrix Completed?: No.    Education has been provided regarding the importance of this vaccine. Patient has been advised to call insurance company to determine out of pocket expense if they have not yet received this vaccine. Advised may also receive vaccine at local pharmacy or Health Dept. Verbalized acceptance and understanding.  Screening Tests Health Maintenance  Topic Date Due   Zoster Vaccines- Shingrix (1 of 2) Never done   COVID-19 Vaccine (4 - Booster for Moderna series) 07/07/2020   INFLUENZA VACCINE  09/16/2021 (Originally 01/17/2021)   COLONOSCOPY (Pts 45-72yrs Insurance coverage will need to be confirmed)  01/28/2022 (Originally 11/28/2020)   HEMOGLOBIN A1C  10/30/2021   OPHTHALMOLOGY EXAM  02/16/2022   FOOT EXAM  05/02/2022   TETANUS/TDAP  04/22/2028   Pneumonia Vaccine 52+ Years old  Completed   Hepatitis C Screening  Completed   HPV VACCINES  Aged Out    Health Maintenance  Health Maintenance Due  Topic Date Due   Zoster Vaccines- Shingrix (1 of 2) Never done   COVID-19 Vaccine (4 - Booster for Moderna series) 07/07/2020    Colorectal cancer screening: Type of screening: Colonoscopy. Completed 12/05/2017. Repeat every 3 years - He has an appt 06/17/21 with [facility]  Lung Cancer  Screening: (Low Dose CT Chest recommended if Age 82-80 years, 30 pack-year currently smoking OR have quit w/in 15years.) does not qualify.   Additional Screening:  Hepatitis C Screening: does qualify; Completed 08/29/2018  Vision Screening: Recommended annual ophthalmology exams for early detection of glaucoma and other disorders of the eye. Is the patient up to date with their annual eye exam?  Yes  Who is the provider or what is the name of the office in which the patient attends annual eye exams? [facility] If pt is not established with a provider, would they like to be referred to a provider to establish care? No .   Dental Screening: Recommended annual dental exams for proper oral hygiene  Community Resource Referral / Chronic Care Management: CRR required this visit?  No   CCM required this visit?  No      Plan:     I have personally reviewed and noted the following in the patients chart:   Medical and social history Use of alcohol, tobacco or illicit drugs  Current medications and supplements including opioid prescriptions. Patient is not currently taking opioid prescriptions. Functional ability and status Nutritional status Physical activity Advanced directives List of other physicians Hospitalizations, surgeries, and ER visits in previous 12 months Vitals Screenings to include cognitive, depression, and falls Referrals and appointments  In addition, I have reviewed and discussed with patient  certain preventive protocols, quality metrics, and best practice recommendations. A written personalized care plan for preventive services as well as general preventive health recommendations were provided to patient.     Sandrea Hammond, LPN   [id number]   Nurse Notes: None

## 2021-06-03 NOTE — Patient Instructions (Signed)
Gregory Malone , Thank you for taking time to come for your Medicare Wellness Visit. I appreciate your ongoing commitment to your health goals. Please review the following plan we discussed and let me know if I can assist you in the future.   Screening recommendations/referrals: Colonoscopy: Done 11/28/2017 - Repeat in 3 years - Appointment 06/17/2021 Recommended yearly ophthalmology/optometry visit for glaucoma screening and checkup Recommended yearly dental visit for hygiene and checkup  Vaccinations: Influenza vaccine: Declined Pneumococcal vaccine: Done 7/25/219 & 04/29/2019 Tdap vaccine: Done 04/22/2018 - Repeat in 10 years  Shingles vaccine: Declined   Covid-19: Done 07/31/2019, 08/29/2019, & 05/12/2020  Advanced directives: in chart  Conditions/risks identified: Great job losing weight and getting your sugar under control! Keep up the great work!   Next appointment: Follow up in one year for your annual wellness visit.   Preventive Care 69 Years and Older, Male  Preventive care refers to lifestyle choices and visits with your health care provider that can promote health and wellness. What does preventive care include? A yearly physical exam. This is also called an annual well check. Dental exams once or twice a year. Routine eye exams. Ask your health care provider how often you should have your eyes checked. Personal lifestyle choices, including: Daily care of your teeth and gums. Regular physical activity. Eating a healthy diet. Avoiding tobacco and drug use. Limiting alcohol use. Practicing safe sex. Taking low doses of aspirin every day. Taking vitamin and mineral supplements as recommended by your health care provider. What happens during an annual well check? The services and screenings done by your health care provider during your annual well check will depend on your age, overall health, lifestyle risk factors, and family history of disease. Counseling  Your health  care provider may ask you questions about your: Alcohol use. Tobacco use. Drug use. Emotional well-being. Home and relationship well-being. Sexual activity. Eating habits. History of falls. Memory and ability to understand (cognition). Work and work Statistician. Screening  You may have the following tests or measurements: Height, weight, and BMI. Blood pressure. Lipid and cholesterol levels. These may be checked every 5 years, or more frequently if you are over 54 years old. Skin check. Lung cancer screening. You may have this screening every year starting at age 67 if you have a 30-pack-year history of smoking and currently smoke or have quit within the past 15 years. Fecal occult blood test (FOBT) of the stool. You may have this test every year starting at age 40. Flexible sigmoidoscopy or colonoscopy. You may have a sigmoidoscopy every 5 years or a colonoscopy every 10 years starting at age 61. Prostate cancer screening. Recommendations will vary depending on your family history and other risks. Hepatitis C blood test. Hepatitis B blood test. Sexually transmitted disease (STD) testing. Diabetes screening. This is done by checking your blood sugar (glucose) after you have not eaten for a while (fasting). You may have this done every 1-3 years. Abdominal aortic aneurysm (AAA) screening. You may need this if you are a current or former smoker. Osteoporosis. You may be screened starting at age 20 if you are at high risk. Talk with your health care provider about your test results, treatment options, and if necessary, the need for more tests. Vaccines  Your health care provider may recommend certain vaccines, such as: Influenza vaccine. This is recommended every year. Tetanus, diphtheria, and acellular pertussis (Tdap, Td) vaccine. You may need a Td booster every 10 years. Zoster vaccine. You may  need this after age 39. Pneumococcal 13-valent conjugate (PCV13) vaccine. One dose is  recommended after age 31. Pneumococcal polysaccharide (PPSV23) vaccine. One dose is recommended after age 7. Talk to your health care provider about which screenings and vaccines you need and how often you need them. This information is not intended to replace advice given to you by your health care provider. Make sure you discuss any questions you have with your health care provider. Document Released: 07/02/2015 Document Revised: 02/23/2016 Document Reviewed: 04/06/2015 Elsevier Interactive Patient Education  2017 [place] Prevention in the Home Falls can cause injuries. They can happen to people of all ages. There are many things you can do to make your home safe and to help prevent falls. What can I do on the outside of my home? Regularly fix the edges of walkways and driveways and fix any cracks. Remove anything that might make you trip as you walk through a door, such as a raised step or threshold. Trim any bushes or trees on the path to your home. Use bright outdoor lighting. Clear any walking paths of anything that might make someone trip, such as rocks or tools. Regularly check to see if handrails are loose or broken. Make sure that both sides of any steps have handrails. Any raised decks and porches should have guardrails on the edges. Have any leaves, snow, or ice cleared regularly. Use sand or salt on walking paths during winter. Clean up any spills in your garage right away. This includes oil or grease spills. What can I do in the bathroom? Use night lights. Install grab bars by the toilet and in the tub and shower. Do not use towel bars as grab bars. Use non-skid mats or decals in the tub or shower. If you need to sit down in the shower, use a plastic, non-slip stool. Keep the floor dry. Clean up any water that spills on the floor as soon as it happens. Remove soap buildup in the tub or shower regularly. Attach bath mats securely with double-sided non-slip rug  tape. Do not have throw rugs and other things on the floor that can make you trip. What can I do in the bedroom? Use night lights. Make sure that you have a light by your bed that is easy to reach. Do not use any sheets or blankets that are too big for your bed. They should not hang down onto the floor. Have a firm chair that has side arms. You can use this for support while you get dressed. Do not have throw rugs and other things on the floor that can make you trip. What can I do in the kitchen? Clean up any spills right away. Avoid walking on wet floors. Keep items that you use a lot in easy-to-reach places. If you need to reach something above you, use a strong step stool that has a grab bar. Keep electrical cords out of the way. Do not use floor polish or wax that makes floors slippery. If you must use wax, use non-skid floor wax. Do not have throw rugs and other things on the floor that can make you trip. What can I do with my stairs? Do not leave any items on the stairs. Make sure that there are handrails on both sides of the stairs and use them. Fix handrails that are broken or loose. Make sure that handrails are as long as the stairways. Check any carpeting to make sure that it is firmly attached  to the stairs. Fix any carpet that is loose or worn. Avoid having throw rugs at the top or bottom of the stairs. If you do have throw rugs, attach them to the floor with carpet tape. Make sure that you have a light switch at the top of the stairs and the bottom of the stairs. If you do not have them, ask someone to add them for you. What else can I do to help prevent falls? Wear shoes that: Do not have high heels. Have rubber bottoms. Are comfortable and fit you well. Are closed at the toe. Do not wear sandals. If you use a stepladder: Make sure that it is fully opened. Do not climb a closed stepladder. Make sure that both sides of the stepladder are locked into place. Ask someone to  hold it for you, if possible. Clearly mark and make sure that you can see: Any grab bars or handrails. First and last steps. Where the edge of each step is. Use tools that help you move around (mobility aids) if they are needed. These include: Canes. Walkers. Scooters. Crutches. Turn on the lights when you go into a dark area. Replace any light bulbs as soon as they burn out. Set up your furniture so you have a clear path. Avoid moving your furniture around. If any of your floors are uneven, fix them. If there are any pets around you, be aware of where they are. Review your medicines with your doctor. Some medicines can make you feel dizzy. This can increase your chance of falling. Ask your doctor what other things that you can do to help prevent falls. This information is not intended to replace advice given to you by your health care provider. Make sure you discuss any questions you have with your health care provider. Document Released: 04/01/2009 Document Revised: 11/11/2015 Document Reviewed: 07/10/2014 Elsevier Interactive Patient Education  2017 [employer].

## 2021-06-06 ENCOUNTER — Other Ambulatory Visit: Payer: Self-pay

## 2021-06-06 ENCOUNTER — Ambulatory Visit (AMBULATORY_SURGERY_CENTER): Payer: PPO | Admitting: *Deleted

## 2021-06-06 ENCOUNTER — Encounter: Payer: Self-pay | Admitting: Gastroenterology

## 2021-06-06 VITALS — Ht 63.0 in | Wt 243.0 lb

## 2021-06-06 DIAGNOSIS — Z86010 Personal history of colonic polyps: Secondary | ICD-10-CM

## 2021-06-06 MED ORDER — PLENVU 140 G PO SOLR: 1.0000 | Freq: Once | ORAL | 0 refills | Status: AC

## 2021-06-06 NOTE — Progress Notes (Signed)
Patient's pre-visit was done today over the phone with the patient. Name,DOB and address verified. Patient denies any allergies to Eggs and Soy. Patient denies any problems with anesthesia/sedation. Patient is not taking any diet pills or blood thinners. No home Oxygen. Packet of Prep instructions mailed to patient including a copy of a consent form-pt is aware. Prep instructions sent to pt's MyChart (if activated).Patient understands to call us back with any questions or concerns. Patient is aware of our care-partner policy and AJ[facility]I-[id number] safety protocol.  The patient is COVID-19 vaccinated.

## 2021-06-09 ENCOUNTER — Other Ambulatory Visit: Payer: Self-pay | Admitting: Family

## 2021-06-09 DIAGNOSIS — I10 Essential (primary) hypertension: Secondary | ICD-10-CM

## 2021-06-10 ENCOUNTER — Other Ambulatory Visit: Payer: Self-pay | Admitting: Family

## 2021-06-16 ENCOUNTER — Ambulatory Visit (AMBULATORY_SURGERY_CENTER): Payer: PPO | Admitting: Gastroenterology

## 2021-06-16 ENCOUNTER — Encounter: Payer: Self-pay | Admitting: Gastroenterology

## 2021-06-16 VITALS — BP 111/77 | HR 78 | Temp 97.8°F | Resp 11 | Ht 63.0 in | Wt 243.0 lb

## 2021-06-16 DIAGNOSIS — D123 Benign neoplasm of transverse colon: Secondary | ICD-10-CM | POA: Diagnosis not present

## 2021-06-16 DIAGNOSIS — D124 Benign neoplasm of descending colon: Secondary | ICD-10-CM

## 2021-06-16 DIAGNOSIS — D128 Benign neoplasm of rectum: Secondary | ICD-10-CM | POA: Diagnosis not present

## 2021-06-16 DIAGNOSIS — D122 Benign neoplasm of ascending colon: Secondary | ICD-10-CM | POA: Diagnosis not present

## 2021-06-16 DIAGNOSIS — Z86010 Personal history of colonic polyps: Secondary | ICD-10-CM

## 2021-06-16 DIAGNOSIS — K621 Rectal polyp: Secondary | ICD-10-CM | POA: Diagnosis not present

## 2021-06-16 MED ORDER — SODIUM CHLORIDE 0.9 % IV SOLN: 500.0000 mL | Freq: Once | INTRAVENOUS | Status: DC

## 2021-06-16 NOTE — Progress Notes (Signed)
Called to room to assist during endoscopic procedure.  Patient ID and intended procedure confirmed with present staff. Received instructions for my participation in the procedure from the performing physician.  

## 2021-06-16 NOTE — Progress Notes (Signed)
VS  DT ? ?Pt's states no medical or surgical changes since previsit or office visit. ? ?

## 2021-06-16 NOTE — Op Note (Signed)
[facility] Patient Name: Gregory Malone Procedure Date: 06/16/2021 1:15 PM MRN: [id number] Endoscopist: Mallie Mussel L. [name] , MD Age: 70 Referring MD:  Date of Birth: 06-30-1950 Gender: Male Account #: [id number] Procedure:                Colonoscopy Indications:              Surveillance: Personal history of adenomatous                            polyps on last colonoscopy 3 years ago                           TA x 4 in 11/2017                           TA x 12 in 11/2016                           (no polyps 2008) Medicines:                Monitored Anesthesia Care Procedure:                Pre-Anesthesia Assessment:                           - Prior to the procedure, a History and Physical                            was performed, and patient medications and                            allergies were reviewed. The patient's tolerance of                            previous anesthesia was also reviewed. The risks                            and benefits of the procedure and the sedation                            options and risks were discussed with the patient.                            All questions were answered, and informed consent                            was obtained. Prior Anticoagulants: The patient has                            taken no previous anticoagulant or antiplatelet                            agents. ASA Grade Assessment: III - A patient with  severe systemic disease. After reviewing the risks                            and benefits, the patient was deemed in                            satisfactory condition to undergo the procedure.                           After obtaining informed consent, the colonoscope                            was passed under direct vision. Throughout the                            procedure, the patient's blood pressure, pulse, and                            oxygen saturations were monitored  continuously. The                            Olympus CF-HQ190L [id number]) Colonoscope was                            introduced through the anus and advanced to the the                            cecum, identified by appendiceal orifice and                            ileocecal valve. The colonoscopy was performed                            without difficulty. The patient tolerated the                            procedure well. The quality of the bowel                            preparation was excellent. The ileocecal valve,                            appendiceal orifice, and rectum were photographed.                            The bowel preparation used was Plenvu. Scope In: 1:40:36 PM Scope Out: 2:02:28 PM Scope Withdrawal Time: 0 hours 18 minutes 36 seconds  Total Procedure Duration: 0 hours 21 minutes 52 seconds  Findings:                 The perianal and digital rectal examinations were                            normal.  Multiple diverticula were found in the left colon                            and right colon.                           A diminutive polyp was found in the ascending                            colon. The polyp was semi-sessile. The polyp was                            removed with a cold biopsy forceps. Resection and                            retrieval were complete.                           Two sessile polyps were found in the ascending                            colon. The polyps were 4 to 6 mm in size. These                            polyps were removed with a cold snare. Resection                            and retrieval were complete.                           Three sessile polyps were found in the descending                            colon and transverse colon. The polyps were 4 to 6                            mm in size. These polyps were removed with a cold                            snare. Resection and retrieval were  complete.                           A diminutive polyp was found in the rectum. The                            polyp was flat. The polyp was removed with a                            piecemeal technique using a cold biopsy forceps.                            Resection and retrieval were complete.  The exam was otherwise without abnormality on                            direct and retroflexion views. Complications:            No immediate complications. Estimated Blood Loss:     Estimated blood loss was minimal. Impression:               - Diverticulosis in the left colon and in the right                            colon.                           - One diminutive polyp in the ascending colon,                            removed with a cold biopsy forceps. Resected and                            retrieved.                           - Two 4 to 6 mm polyps in the ascending colon,                            removed with a cold snare. Resected and retrieved.                           - Three 4 to 6 mm polyps in the descending colon                            and in the transverse colon, removed with a cold                            snare. Resected and retrieved.                           - One diminutive polyp in the rectum, removed                            piecemeal using a cold biopsy forceps. Resected and                            retrieved.                           - The examination was otherwise normal on direct                            and retroflexion views. Recommendation:           - Patient has a contact number available for                            emergencies. The  signs and symptoms of potential                            delayed complications were discussed with the                            patient. Return to normal activities tomorrow.                            Written discharge instructions were provided to the                             patient.                           - Resume previous diet.                           - Continue present medications.                           - Await pathology results.                           - Repeat colonoscopy is recommended for                            surveillance. The colonoscopy date will be                            determined after pathology results from today's                            exam become available for review. Sherae Santino L. [name], MD 06/16/2021 2:08:05 PM This report has been signed electronically.

## 2021-06-16 NOTE — Progress Notes (Signed)
History and Physical:  This patient presents for endoscopic testing for: Encounter Diagnosis  Name Primary?   Personal history of colonic polyps Yes    TA x 4 11/2017 TA x 12 11/2016 Patient denies chronic abdominal pain, rectal bleeding, constipation or diarrhea.   ROS: Patient denies chest pain or cough   Past Medical History: Past Medical History:  Diagnosis Date   Arthritis    Asthma    Bowel obstruction ([place])    CAD (coronary artery disease)    Chronic kidney disease    nephrolithiasis  couple episodes 10 years ago   GERD (gastroesophageal reflux disease)    Hx of colonic polyp    Hypercholesterolemia    Hypertension    Prostate cancer ([place]) 03/26/12 bx   ,gleason=3+3=6,PSA=4.22,volume=34cc   Pulmonary nodule    Radiation 06/27/2012-08/20/2012   prostate 7600 cGy 38 sessions     Past Surgical History: Past Surgical History:  Procedure Laterality Date   COLONOSCOPY  11/28/2017   Dr.Danis   POLYPECTOMY     PROSTATE BIOPSY  03/26/2012   adenocarcinoma   TONSILLECTOMY     UPPER GASTROINTESTINAL ENDOSCOPY      Allergies: Allergies  Allergen Reactions   Nsaids     Told not to take because of CKD    Outpatient Meds: Current Outpatient Medications  Medication Sig Dispense Refill   acetaminophen (TYLENOL) 500 MG tablet Take 500 mg by mouth every 6 (six) hours as needed.     amLODipine (NORVASC) 10 MG tablet TAKE 1 TABLET BY MOUTH EVERY DAY 90 tablet 0   atorvastatin (LIPITOR) 80 MG tablet TAKE 1 TABLET BY MOUTH EVERY DAY 90 tablet 0   blood glucose meter kit and supplies Dispense based on patient and insurance preference. Use up to four times daily as directed. (FOR ICD-10 E10.9, E11.9). 1 each 0   hydrochlorothiazide (HYDRODIURIL) 25 MG tablet TAKE 1 TABLET (25 MG TOTAL) BY MOUTH DAILY. 90 tablet 0   KLOR-CON M20 20 MEQ tablet TAKE 1 TABLET BY MOUTH EVERY DAY 90 tablet 0   lisinopril (ZESTRIL) 40 MG tablet TAKE 1 TABLET BY MOUTH EVERY DAY 90 tablet 0    metoprolol tartrate (LOPRESSOR) 25 MG tablet TAKE 1 TABLET BY MOUTH TWICE A DAY 180 tablet 1   Multiple Vitamin (MULTIVITAMIN ADULT PO) Take by mouth.     ONETOUCH VERIO test strip USE TO CHECK SUGAR UP TO 4 TIMES DAILY ICD-10 E10.9, E11.9 100 strip 1   Current Facility-Administered Medications  Medication Dose Route Frequency Provider Last Rate Last Admin   0.9 %  sodium chloride infusion  500 mL Intravenous Once Danis, Estill Cotta III, MD          ___________________________________________________________________ Objective   Exam:  BP 119/63    Pulse 89    Temp 97.8 F (36.6 C)    Ht $R'5\' 3"'oq$  (1.6 m)    Wt 243 lb (110.2 kg)    SpO2 95%    BMI 43.05 kg/m   CV: RRR without murmur, S1/S2 Resp: clear to auscultation bilaterally, normal RR and effort noted GI: soft, no tenderness, with active bowel sounds.   Assessment: Encounter Diagnosis  Name Primary?   Personal history of colonic polyps Yes     Plan: Colonoscopy  The benefits and risks of the planned procedure were described in detail with the patient or (when appropriate) their health care proxy.  Risks were outlined as including, but not limited to, bleeding, infection, perforation, adverse medication reaction leading to  cardiac or pulmonary decompensation, pancreatitis (if ERCP).  The limitation of incomplete mucosal visualization was also discussed.  No guarantees or warranties were given.    The patient is appropriate for an endoscopic procedure in the ambulatory setting.   - Wilfrid Lund, MD

## 2021-06-16 NOTE — Patient Instructions (Signed)
Handouts on polyps and diverticulosis given.  YOU HAD AN ENDOSCOPIC PROCEDURE TODAY AT [facility]:   Refer to the procedure report that was given to you for any specific questions about what was found during the examination.  If the procedure report does not answer your questions, please call your gastroenterologist to clarify.  If you requested that your care partner not be given the details of your procedure findings, then the procedure report has been included in a sealed envelope for you to review at your convenience later.  YOU SHOULD EXPECT: Some feelings of bloating in the abdomen. Passage of more gas than usual.  Walking can help get rid of the air that was put into your GI tract during the procedure and reduce the bloating. If you had a lower endoscopy (such as a colonoscopy or flexible sigmoidoscopy) you may notice spotting of blood in your stool or on the toilet paper. If you underwent a bowel prep for your procedure, you may not have a normal bowel movement for a few days.  Please Note:  You might notice some irritation and congestion in your nose or some drainage.  This is from the oxygen used during your procedure.  There is no need for concern and it should clear up in a day or so.  SYMPTOMS TO REPORT IMMEDIATELY:  Following lower endoscopy (colonoscopy or flexible sigmoidoscopy):  Excessive amounts of blood in the stool  Significant tenderness or worsening of abdominal pains  Swelling of the abdomen that is new, acute  Fever of 100F or higher  For urgent or emergent issues, a gastroenterologist can be reached at any hour by calling [phone]. Do not use MyChart messaging for urgent concerns.    DIET:  We do recommend a small meal at first, but then you may proceed to your regular diet.  Drink plenty of fluids but you should avoid alcoholic beverages for 24 hours.  ACTIVITY:  You should plan to take it easy for the rest of today and you should NOT DRIVE or  use heavy machinery until tomorrow (because of the sedation medicines used during the test).    FOLLOW UP: Our staff will call the number listed on your records 48-72 hours following your procedure to check on you and address any questions or concerns that you may have regarding the information given to you following your procedure. If we do not reach you, we will leave a message.  We will attempt to reach you two times.  During this call, we will ask if you have developed any symptoms of COVID 19. If you develop any symptoms (ie: fever, flu-like symptoms, shortness of breath, cough etc.) before then, please call [phone].  If you test positive for Covid 19 in the 2 weeks post procedure, please call and report this information to [place].    If any biopsies were taken you will be contacted by phone or by letter within the next 1-3 weeks.  Please call us at [phone] if you have not heard about the biopsies in 3 weeks.    SIGNATURES/CONFIDENTIALITY: You and/or your care partner have signed paperwork which will be entered into your electronic medical record.  These signatures attest to the fact that that the information above on your After Visit Summary has been reviewed and is understood.  Full responsibility of the confidentiality of this discharge information lies with you and/or your care-partner.

## 2021-06-16 NOTE — Progress Notes (Signed)
PT taken to PACU. Monitors in place. VSS. Report given to RN. 

## 2021-06-22 ENCOUNTER — Telehealth: Payer: Self-pay

## 2021-06-22 ENCOUNTER — Telehealth: Payer: Self-pay | Admitting: *Deleted

## 2021-06-22 NOTE — Telephone Encounter (Signed)
Patient returning your call, stated he was doing fine since his procedure.

## 2021-06-22 NOTE — Telephone Encounter (Signed)
Called [phone] for a follow up call from procedure.I was not able to leave a message.  The voice mail was not set up.

## 2021-06-22 NOTE — Telephone Encounter (Signed)
°  Follow up Call-  Call back number 06/16/2021  Post procedure Call Back phone  # [phone]  Permission to leave phone message No  Some recent data might be hidden     Patient questions:  Do you have a fever, pain , or abdominal swelling? No. Pain Score  0 *  Have you tolerated food without any problems? Yes.    Have you been able to return to your normal activities? Yes.    Do you have any questions about your discharge instructions: Diet   No. Medications  No. Follow up visit  No.  Do you have questions or concerns about your Care? No.  Actions: * If pain score is 4 or above: No action needed, pain <4.  Have you developed a fever since your procedure? no  2.   Have you had an respiratory symptoms (SOB or cough) since your procedure? no  3.   Have you tested positive for COVID 19 since your procedure no  4.   Have you had any family members/close contacts diagnosed with the COVID 19 since your procedure?  no   If yes to any of these questions please route to Joylene John, RN and Joella Prince, RN

## 2021-06-23 ENCOUNTER — Encounter: Payer: Self-pay | Admitting: Gastroenterology

## 2021-07-06 ENCOUNTER — Telehealth: Payer: PPO

## 2021-07-11 ENCOUNTER — Other Ambulatory Visit: Payer: Self-pay | Admitting: Family

## 2021-07-11 DIAGNOSIS — E785 Hyperlipidemia, unspecified: Secondary | ICD-10-CM

## 2021-07-18 ENCOUNTER — Other Ambulatory Visit: Payer: Self-pay | Admitting: Family

## 2021-07-18 DIAGNOSIS — I10 Essential (primary) hypertension: Secondary | ICD-10-CM

## 2021-09-05 ENCOUNTER — Other Ambulatory Visit: Payer: Self-pay | Admitting: Family

## 2021-09-05 DIAGNOSIS — I10 Essential (primary) hypertension: Secondary | ICD-10-CM

## 2021-10-29 ENCOUNTER — Other Ambulatory Visit: Payer: Self-pay | Admitting: Family

## 2021-10-29 DIAGNOSIS — I10 Essential (primary) hypertension: Secondary | ICD-10-CM

## 2021-10-31 NOTE — Telephone Encounter (Signed)
OV 11/11/21 ?

## 2021-11-07 ENCOUNTER — Other Ambulatory Visit: Payer: PPO

## 2021-11-07 DIAGNOSIS — E1122 Type 2 diabetes mellitus with diabetic chronic kidney disease: Secondary | ICD-10-CM | POA: Diagnosis not present

## 2021-11-07 DIAGNOSIS — I129 Hypertensive chronic kidney disease with stage 1 through stage 4 chronic kidney disease, or unspecified chronic kidney disease: Secondary | ICD-10-CM | POA: Diagnosis not present

## 2021-11-07 DIAGNOSIS — N200 Calculus of kidney: Secondary | ICD-10-CM | POA: Diagnosis not present

## 2021-11-07 DIAGNOSIS — N189 Chronic kidney disease, unspecified: Secondary | ICD-10-CM | POA: Diagnosis not present

## 2021-11-11 ENCOUNTER — Ambulatory Visit (INDEPENDENT_AMBULATORY_CARE_PROVIDER_SITE_OTHER): Payer: PPO | Admitting: Family

## 2021-11-11 ENCOUNTER — Encounter: Payer: Self-pay | Admitting: Family

## 2021-11-11 VITALS — BP 122/74 | HR 70 | Temp 98.0°F | Ht 63.0 in | Wt 246.6 lb

## 2021-11-11 DIAGNOSIS — J4520 Mild intermittent asthma, uncomplicated: Secondary | ICD-10-CM | POA: Diagnosis not present

## 2021-11-11 DIAGNOSIS — E669 Obesity, unspecified: Secondary | ICD-10-CM

## 2021-11-11 DIAGNOSIS — I10 Essential (primary) hypertension: Secondary | ICD-10-CM | POA: Diagnosis not present

## 2021-11-11 DIAGNOSIS — I2510 Atherosclerotic heart disease of native coronary artery without angina pectoris: Secondary | ICD-10-CM | POA: Diagnosis not present

## 2021-11-11 DIAGNOSIS — K219 Gastro-esophageal reflux disease without esophagitis: Secondary | ICD-10-CM | POA: Diagnosis not present

## 2021-11-11 DIAGNOSIS — Z6841 Body Mass Index (BMI) 40.0 and over, adult: Secondary | ICD-10-CM | POA: Diagnosis not present

## 2021-11-11 DIAGNOSIS — E1169 Type 2 diabetes mellitus with other specified complication: Secondary | ICD-10-CM

## 2021-11-11 DIAGNOSIS — Z8546 Personal history of malignant neoplasm of prostate: Secondary | ICD-10-CM | POA: Diagnosis not present

## 2021-11-11 DIAGNOSIS — N1831 Chronic kidney disease, stage 3a: Secondary | ICD-10-CM | POA: Diagnosis not present

## 2021-11-11 DIAGNOSIS — E785 Hyperlipidemia, unspecified: Secondary | ICD-10-CM | POA: Diagnosis not present

## 2021-11-11 LAB — BAYER DCA HB A1C WAIVED: HB A1C (BAYER DCA - WAIVED): 6 % — ABNORMAL HIGH (ref 4.8–5.6)

## 2021-11-11 NOTE — Progress Notes (Signed)
Subjective:    Patient ID: Gregory Malone, male    DOB: Oct 09, 1950, 71 y.o.   MRN: [id number]  Chief Complaint  Patient presents with   Medical Management of Chronic Issues   Pt presents to the office today for chronic follow up. He has hx of Prostate Cancer 2013.    He has benign pulmonary nodules that are stable, last CT chest scan was 04/2019. He followed by Nephrologists for CKD stage 3 every 6 months.  Hypertension This is a chronic problem. The current episode started more than 1 year ago. The problem has been resolved since onset. The problem is controlled. Pertinent negatives include no blurred vision, malaise/fatigue, peripheral edema or shortness of breath. Risk factors for coronary artery disease include dyslipidemia, obesity and male gender. The current treatment provides moderate improvement.  Asthma There is no cough, shortness of breath or wheezing. This is a chronic problem. The current episode started more than 1 year ago. The problem occurs intermittently. Associated symptoms include heartburn. Pertinent negatives include no malaise/fatigue. His symptoms are alleviated by rest. He reports moderate improvement on treatment. His past medical history is significant for asthma.  Gastroesophageal Reflux He complains of belching and heartburn. He reports no coughing or no wheezing. This is a chronic problem. The current episode started more than 1 year ago. The problem occurs occasionally. Risk factors include obesity. He has tried a PPI for the symptoms. The treatment provided moderate relief.  Diabetes He presents for his follow-up diabetic visit. He has type 2 diabetes mellitus. Pertinent negatives for diabetes include no blurred vision and no foot paresthesias. Symptoms are stable. Diabetic complications include nephropathy. Pertinent negatives for diabetic complications include no heart disease or peripheral neuropathy. Risk factors for coronary artery disease include  dyslipidemia, diabetes mellitus, hypertension, male sex and sedentary lifestyle. He is following a generally healthy diet. His overall blood glucose range is 90-110 mg/dl. An ACE inhibitor/angiotensin II receptor blocker is being taken. Eye exam is current.  Hyperlipidemia This is a chronic problem. The current episode started more than 1 year ago. The problem is controlled. Recent lipid tests were reviewed and are normal. Exacerbating diseases include obesity. Pertinent negatives include no shortness of breath. Current antihyperlipidemic treatment includes statins. The current treatment provides moderate improvement of lipids. Risk factors for coronary artery disease include dyslipidemia, diabetes mellitus, male sex, hypertension and a sedentary lifestyle.     Review of Systems  Constitutional:  Negative for malaise/fatigue.  Eyes:  Negative for blurred vision.  Respiratory:  Negative for cough, shortness of breath and wheezing.   Gastrointestinal:  Positive for heartburn.  All other systems reviewed and are negative.     Objective:   Physical Exam Vitals reviewed.  Constitutional:      General: He is not in acute distress.    Appearance: He is well-developed. He is obese.  HENT:     Head: Normocephalic.     Right Ear: Tympanic membrane normal.     Left Ear: Tympanic membrane normal.  Eyes:     General:        Right eye: No discharge.        Left eye: No discharge.     Pupils: Pupils are equal, round, and reactive to light.  Neck:     Thyroid: No thyromegaly.  Cardiovascular:     Rate and Rhythm: Normal rate and regular rhythm.     Heart sounds: Normal heart sounds. No murmur heard. Pulmonary:     Effort:  Pulmonary effort is normal. No respiratory distress.     Breath sounds: Normal breath sounds. No wheezing.  Abdominal:     General: Bowel sounds are normal. There is no distension.     Palpations: Abdomen is soft.     Tenderness: There is no abdominal tenderness.   Musculoskeletal:        General: No tenderness. Normal range of motion.     Cervical back: Normal range of motion and neck supple.  Skin:    General: Skin is warm and dry.     Findings: No erythema or rash.  Neurological:     Mental Status: He is alert and oriented to person, place, and time.     Cranial Nerves: No cranial nerve deficit.     Deep Tendon Reflexes: Reflexes are normal and symmetric.  Psychiatric:        Behavior: Behavior normal.        Thought Content: Thought content normal.        Judgment: Judgment normal.      BP 122/74   Pulse 70   Temp 98 F (36.7 C)   Ht '5\' 3"'$  (1.6 m)   Wt 246 lb 9.6 oz (111.9 kg)   SpO2 94%   BMI 43.68 kg/m      Assessment & Plan:  MORRIE DAYWALT comes in today with chief complaint of Medical Management of Chronic Issues   Diagnosis and orders addressed:  1. Essential hypertension  2. Coronary arteriosclerosis  3. Mild intermittent asthma without complication  4. Gastroesophageal reflux disease, unspecified whether esophagitis present  5. Diabetes mellitus type 2 in obese (HCC) - Bayer DCA Hb A1c Waived  6. Body mass index (BMI) 45.0-49.9, adult ([place])  7. Morbid obesity ([place])  8. History of prostate cancer  9. Hyperlipidemia, unspecified hyperlipidemia type  10. Stage 3a chronic kidney disease ([place])    Labs pending Health Maintenance reviewed Diet and exercise encouraged  Follow up plan: 6 months    Evelina Dun, FNP

## 2021-11-11 NOTE — Patient Instructions (Signed)
Health Maintenance After Age 70 After age 70, you are at a higher risk for certain long-term diseases and infections as well as injuries from falls. Falls are a major cause of broken bones and head injuries in people who are older than age 70. Getting regular preventive care can help to keep you healthy and well. Preventive care includes getting regular testing and making lifestyle changes as recommended by your health care provider. Talk with your health care provider about: Which screenings and tests you should have. A screening is a test that checks for a disease when you have no symptoms. A diet and exercise plan that is right for you. What should I know about screenings and tests to prevent falls? Screening and testing are the best ways to find a health problem early. Early diagnosis and treatment give you the best chance of managing medical conditions that are common after age 70. Certain conditions and lifestyle choices may make you more likely to have a fall. Your health care provider may recommend: Regular vision checks. Poor vision and conditions such as cataracts can make you more likely to have a fall. If you wear glasses, make sure to get your prescription updated if your vision changes. Medicine review. Work with your health care provider to regularly review all of the medicines you are taking, including over-the-counter medicines. Ask your health care provider about any side effects that may make you more likely to have a fall. Tell your health care provider if any medicines that you take make you feel dizzy or sleepy. Strength and balance checks. Your health care provider may recommend certain tests to check your strength and balance while standing, walking, or changing positions. Foot health exam. Foot pain and numbness, as well as not wearing proper footwear, can make you more likely to have a fall. Screenings, including: Osteoporosis screening. Osteoporosis is a condition that causes  the bones to get weaker and break more easily. Blood pressure screening. Blood pressure changes and medicines to control blood pressure can make you feel dizzy. Depression screening. You may be more likely to have a fall if you have a fear of falling, feel depressed, or feel unable to do activities that you used to do. Alcohol use screening. Using too much alcohol can affect your balance and may make you more likely to have a fall. Follow these instructions at home: Lifestyle Do not drink alcohol if: Your health care provider tells you not to drink. If you drink alcohol: Limit how much you have to: 0-1 drink a day for women. 0-2 drinks a day for men. Know how much alcohol is in your drink. In the [place] one drink equals one 12 oz bottle of beer (355 mL), one 5 oz glass of wine (148 mL), or one 1 oz glass of hard liquor (44 mL). Do not use any products that contain nicotine or tobacco. These products include cigarettes, chewing tobacco, and vaping devices, such as e-cigarettes. If you need help quitting, ask your health care provider. Activity  Follow a regular exercise program to stay fit. This will help you maintain your balance. Ask your health care provider what types of exercise are appropriate for you. If you need a cane or walker, use it as recommended by your health care provider. Wear supportive shoes that have nonskid soles. Safety  Remove any tripping hazards, such as rugs, cords, and clutter. Install safety equipment such as grab bars in bathrooms and safety rails on stairs. Keep rooms and walkways   well-lit. General instructions Talk with your health care provider about your risks for falling. Tell your health care provider if: You fall. Be sure to tell your health care provider about all falls, even ones that seem minor. You feel dizzy, tiredness (fatigue), or off-balance. Take over-the-counter and prescription medicines only as told by your health care provider. These include  supplements. Eat a healthy diet and maintain a healthy weight. A healthy diet includes low-fat dairy products, low-fat (lean) meats, and fiber from whole grains, beans, and lots of fruits and vegetables. Stay current with your vaccines. Schedule regular health, dental, and eye exams. Summary Having a healthy lifestyle and getting preventive care can help to protect your health and wellness after age 70. Screening and testing are the best way to find a health problem early and help you avoid having a fall. Early diagnosis and treatment give you the best chance for managing medical conditions that are more common for people who are older than age 70. Falls are a major cause of broken bones and head injuries in people who are older than age 70. Take precautions to prevent a fall at home. Work with your health care provider to learn what changes you can make to improve your health and wellness and to prevent falls. This information is not intended to replace advice given to you by your health care provider. Make sure you discuss any questions you have with your health care provider. Document Revised: 10/25/2020 Document Reviewed: 10/25/2020 Elsevier Patient Education  2023 [employer]  

## 2021-11-16 DIAGNOSIS — N189 Chronic kidney disease, unspecified: Secondary | ICD-10-CM | POA: Diagnosis not present

## 2021-11-16 DIAGNOSIS — E870 Hyperosmolality and hypernatremia: Secondary | ICD-10-CM | POA: Diagnosis not present

## 2021-11-16 DIAGNOSIS — E1122 Type 2 diabetes mellitus with diabetic chronic kidney disease: Secondary | ICD-10-CM | POA: Diagnosis not present

## 2021-11-16 DIAGNOSIS — I129 Hypertensive chronic kidney disease with stage 1 through stage 4 chronic kidney disease, or unspecified chronic kidney disease: Secondary | ICD-10-CM | POA: Diagnosis not present

## 2021-12-01 ENCOUNTER — Other Ambulatory Visit: Payer: Self-pay | Admitting: Family

## 2021-12-01 DIAGNOSIS — I10 Essential (primary) hypertension: Secondary | ICD-10-CM

## 2021-12-14 ENCOUNTER — Other Ambulatory Visit: Payer: Self-pay | Admitting: Family

## 2021-12-14 DIAGNOSIS — I10 Essential (primary) hypertension: Secondary | ICD-10-CM

## 2022-01-13 ENCOUNTER — Other Ambulatory Visit: Payer: Self-pay | Admitting: Family

## 2022-01-13 DIAGNOSIS — E785 Hyperlipidemia, unspecified: Secondary | ICD-10-CM

## 2022-02-11 ENCOUNTER — Other Ambulatory Visit: Payer: Self-pay | Admitting: Family

## 2022-02-11 DIAGNOSIS — I10 Essential (primary) hypertension: Secondary | ICD-10-CM

## 2022-02-17 ENCOUNTER — Other Ambulatory Visit: Payer: Self-pay | Admitting: Family

## 2022-02-17 DIAGNOSIS — I2510 Atherosclerotic heart disease of native coronary artery without angina pectoris: Secondary | ICD-10-CM

## 2022-03-13 ENCOUNTER — Encounter: Payer: Self-pay | Admitting: Nurse Practitioner

## 2022-03-13 ENCOUNTER — Ambulatory Visit (INDEPENDENT_AMBULATORY_CARE_PROVIDER_SITE_OTHER): Payer: PPO | Admitting: Nurse Practitioner

## 2022-03-13 ENCOUNTER — Ambulatory Visit (INDEPENDENT_AMBULATORY_CARE_PROVIDER_SITE_OTHER): Payer: PPO

## 2022-03-13 VITALS — BP 121/88 | HR 107 | Temp 98.7°F | Ht 63.0 in | Wt 240.0 lb

## 2022-03-13 DIAGNOSIS — S6991XA Unspecified injury of right wrist, hand and finger(s), initial encounter: Secondary | ICD-10-CM

## 2022-03-13 DIAGNOSIS — S6992XA Unspecified injury of left wrist, hand and finger(s), initial encounter: Secondary | ICD-10-CM | POA: Diagnosis not present

## 2022-03-13 MED ORDER — PREDNISONE 20 MG PO TABS: 20.0000 mg | ORAL_TABLET | Freq: Every day | ORAL | 0 refills | Status: DC

## 2022-03-13 MED ORDER — METHYLPREDNISOLONE ACETATE 40 MG/ML IJ SUSP
40.0000 mg | Freq: Once | INTRAMUSCULAR | Status: AC
  Administered 2022-03-13: 40 mg via INTRAMUSCULAR

## 2022-03-13 NOTE — Progress Notes (Signed)
Acute Office Visit  Subjective:     Patient ID: Gregory Malone, male    DOB: 12/28/1950, 71 y.o.   MRN: [id number]  Chief Complaint  Patient presents with   Wrist Pain    Both wrist since the 22nd     Wrist Pain  The pain is present in the right wrist and left wrist. This is a new problem. There has been no history of extremity trauma. The problem occurs constantly. The problem has been unchanged. The quality of the pain is described as aching. The pain is at a severity of 8/10. The pain is severe. Associated symptoms include joint swelling and a limited range of motion. The symptoms are aggravated by activity. He has tried movement for the symptoms.    Review of Systems  Constitutional: Negative.   HENT: Negative.    Eyes: Negative.   Respiratory: Negative.    Cardiovascular: Negative.   Genitourinary: Negative.   Musculoskeletal:  Positive for joint pain.       Bilateral wrist pain  Skin: Negative.   All other systems reviewed and are negative.       Objective:    BP 121/88   Pulse (!) 107   Temp 98.7 F (37.1 C)   Ht '5\' 3"'$  (1.6 m)   Wt 240 lb (108.9 kg)   SpO2 95%   BMI 42.51 kg/m  BP Readings from Last 3 Encounters:  03/13/22 121/88  11/11/21 122/74  06/16/21 111/77   Wt Readings from Last 3 Encounters:  03/13/22 240 lb (108.9 kg)  11/11/21 246 lb 9.6 oz (111.9 kg)  06/16/21 243 lb (110.2 kg)      Physical Exam Vitals and nursing note reviewed.  Constitutional:      Appearance: Normal appearance.  HENT:     Head: Normocephalic.     Right Ear: External ear normal.     Left Ear: External ear normal.     Nose: Nose normal.  Cardiovascular:     Pulses: Normal pulses.     Heart sounds: Normal heart sounds.  Pulmonary:     Effort: Pulmonary effort is normal.     Breath sounds: Normal breath sounds.  Abdominal:     General: Bowel sounds are normal.  Musculoskeletal:        General: Swelling present.     Right wrist: Swelling and tenderness  present. No snuff box tenderness. Decreased range of motion.     Left wrist: Swelling and tenderness present. No snuff box tenderness. Decreased range of motion.     Comments: Bilateral wrist swelling  Neurological:     General: No focal deficit present.     Mental Status: He is oriented to person, place, and time.  Psychiatric:        Behavior: Behavior normal.     No results found for any visits on 03/13/22.      Assessment & Plan:  Patient presents with bilateral wrist pain from a recent fall Completed bilateral wrist x-ray results pending. 40 Depo-Medrol shot given in clinic Rest arms Warm compress as tolerated Prednisone 20 mg tablet by mouth daily Follow-up with worsening unresolved symptoms.  Problem List Items Addressed This Visit   None Visit Diagnoses     Injury of right wrist, initial encounter    -  Primary   Relevant Medications   predniSONE (DELTASONE) 20 MG tablet   methylPREDNISolone acetate (DEPO-MEDROL) injection 40 mg (Completed)   Other Relevant Orders   DG Wrist Complete Right  Injury of wrist, left, initial encounter       Relevant Medications   predniSONE (DELTASONE) 20 MG tablet   methylPREDNISolone acetate (DEPO-MEDROL) injection 40 mg (Completed)   Other Relevant Orders   DG Wrist Complete Left       Meds ordered this encounter  Medications   predniSONE (DELTASONE) 20 MG tablet    Sig: Take 1 tablet (20 mg total) by mouth daily with breakfast.    Dispense:  6 tablet    Refill:  0    Order Specific Question:   Supervising Provider    Answer:   Claretta Fraise [[id number]]   methylPREDNISolone acetate (DEPO-MEDROL) injection 40 mg    Return if symptoms worsen or fail to improve.  Ivy Lynn, NP

## 2022-03-13 NOTE — Patient Instructions (Signed)
Wrist Pain, Adult There are many things that can cause wrist pain. Some common causes include: An injury to the wrist. Using the joint too much. A condition that causes too much pressure to be put on a nerve in the wrist (carpal tunnel syndrome). Wear and tear of the joints that happens as a person gets older (osteoarthritis). A condition that causes swelling and stiffness in the joints (arthritis). Sometimes, the cause of wrist pain is not known. Often, the pain goes away when you follow your doctor's instructions for easing pain at home. This may include resting your wrist, icing your wrist, or using a splint or an elastic wrap for a short time. It is important to tell your doctor if your wrist pain does not go away. Follow these instructions at home: If you have a splint or elastic wrap: Wear the splint or wrap as told by your doctor. Take it off only as told by your doctor. Ask if you can take it off for bathing. Loosen the splint or wrap if your fingers: Tingle. Become numb. Turn cold and blue. Check the skin around the splint or wrap every day. Tell your doctor about any concerns. Keep the splint or wrap clean. If the splint or wrap is not waterproof: Do not let it get wet. Cover it with a watertight covering when you take a bath or shower. Managing pain, stiffness, and swelling  If told, put ice on the painful area. To do this: If you have a removable splint or wrap, take it off as told by your doctor. Put ice in a plastic bag. Place a towel between your skin and the bag or between your splint or wrap and the bag. Leave the ice on for 20 minutes, 2-3 times a day. Move your fingers often. Raise (elevate) the injured area above the level of your heart while you are sitting or lying down. Activity Rest your wrist as told by your doctor. Return to your normal activities as told by your doctor. Ask your doctor what activities are safe for you. Ask your doctor when it is safe to  drive if you have a splint or wrap on your wrist. Do exercises as told by your doctor. General instructions Pay attention to any changes in your symptoms. Take over-the-counter and prescription medicines only as told by your doctor. Keep all follow-up visits as told by your doctor. This is important. Contact a doctor if: You have a sudden, sharp pain in the wrist, hand, or arm that is different or new. The swelling or bruising on your wrist or hand gets worse. Your skin: Becomes red. Gets a rash. Has open sores. Your pain does not get better. Your pain gets worse. You have a fever or chills. Get help right away if: You lose feeling in your fingers or hand. Your fingers turn white, very red, or cold and blue. You cannot move your fingers. Summary There are many things that can cause wrist pain. It is important to tell your doctor if your wrist pain does not go away. You may need to wear a splint or a wrap for a short period of time. Return to your normal activities as told by your doctor. Ask your doctor what activities are safe for you. This information is not intended to replace advice given to you by your health care provider. Make sure you discuss any questions you have with your health care provider. Document Revised: 04/24/2019 Document Reviewed: 04/24/2019 Elsevier Patient Education  2023  [employer].

## 2022-03-15 ENCOUNTER — Telehealth: Payer: Self-pay | Admitting: Family

## 2022-03-15 NOTE — Telephone Encounter (Signed)
No fracture, or dislocation Mild degenerative changes as described above. No acute abnormality seen. Continue symptom management for pain as discussed in clinic.

## 2022-03-17 NOTE — Telephone Encounter (Signed)
Patient aware and verbalized understanding. °

## 2022-04-04 ENCOUNTER — Other Ambulatory Visit: Payer: Self-pay | Admitting: Family

## 2022-04-04 DIAGNOSIS — I10 Essential (primary) hypertension: Secondary | ICD-10-CM

## 2022-05-04 ENCOUNTER — Telehealth (INDEPENDENT_AMBULATORY_CARE_PROVIDER_SITE_OTHER): Payer: PPO | Admitting: Nurse Practitioner

## 2022-05-04 ENCOUNTER — Encounter: Payer: Self-pay | Admitting: Nurse Practitioner

## 2022-05-04 DIAGNOSIS — M79672 Pain in left foot: Secondary | ICD-10-CM | POA: Diagnosis not present

## 2022-05-04 MED ORDER — PREDNISONE 20 MG PO TABS: 40.0000 mg | ORAL_TABLET | Freq: Every day | ORAL | 0 refills | Status: AC

## 2022-05-04 NOTE — Progress Notes (Signed)
Virtual Visit Consent   Gregory Malone, you are scheduled for a virtual visit with Gregory Malone, Gregory Malone, a [facility] provider, today.     Just as with appointments in the office, your consent must be obtained to participate.  Your consent will be active for this visit and any virtual visit you may have with one of our providers in the next 365 days.     If you have a MyChart account, a copy of this consent can be sent to you electronically.  All virtual visits are billed to your insurance company just like a traditional visit in the office.    As this is a virtual visit, video technology does not allow for your provider to perform a traditional examination.  This may limit your provider's ability to fully assess your condition.  If your provider identifies any concerns that need to be evaluated in person or the need to arrange testing (such as labs, EKG, etc.), we will make arrangements to do so.     Although advances in technology are sophisticated, we cannot ensure that it will always work on either your end or our end.  If the connection with a video visit is poor, the visit may have to be switched to a telephone visit.  With either a video or telephone visit, we are not always able to ensure that we have a secure connection.     I need to obtain your verbal consent now.   Are you willing to proceed with your visit today? YES   Gregory Malone has provided verbal consent on 05/04/2022 for a virtual visit (video or telephone).   Gregory Hassell Done, FNP   Date: 05/04/2022 1:58 PM   Virtual Visit via Video Note   I, Gregory Malone, connected with Gregory Malone ([id number], 04-22-1951) on 05/04/22 at  5:30 PM EST by a video-enabled telemedicine application and verified that I am speaking with the correct person using two identifiers.  Location: Patient: Virtual Visit Location Patient: Home Provider: Virtual Visit Location Provider: Mobile   I discussed the  limitations of evaluation and management by telemedicine and the availability of in person appointments. The patient expressed understanding and agreed to proceed.    History of Present Illness: Gregory Malone is a 71 y.o. who identifies as a male who was assigned male at birth, and is being seen today for gout.  HPI: Since the first of November his feet have been hurting to walk on. The right foot has gotten better but the left foot  is still hurting. Pain is only when walking. Rates pain 6-7/10. Sitting helps. He has been soaking foot in epsom salt.    Review of Systems  Musculoskeletal:  Positive for joint pain (foot).    Problems:  Patient Active Problem List   Diagnosis Date Noted   Body mass index (BMI) 45.0-49.9, adult ([place]) 10/11/2020   Right hip pain 10/04/2020   Degeneration of lumbar intervertebral disc 08/04/2019   Inflammation of sacroiliac joint ([place]) 08/04/2019   History of prostate cancer 07/30/2019   Pulmonary nodules 05/07/2019   Asthma 08/27/2018   Chronic kidney disease, stage 3 unspecified ([place]) 08/27/2018   Coronary arteriosclerosis 08/27/2018   Hypokalemia 08/27/2018   Renal stone 08/27/2018   Gastroesophageal reflux disease 10/26/2016   Hematemesis without nausea 10/26/2016   Atypical chest pain 10/26/2016   Hemoptysis 10/03/2016   Diabetes mellitus type 2 in obese ([place]) 12/27/2015   Prediabetes 12/27/2015   Essential hypertension 04/21/2015  Hyperlipidemia 04/21/2015   Morbid obesity ([place]) 04/21/2015    Allergies:  Allergies  Allergen Reactions   Nsaids     Told not to take because of CKD   Medications:  Current Outpatient Medications:    acetaminophen (TYLENOL) 500 MG tablet, Take 500 mg by mouth every 6 (six) hours as needed., Disp: , Rfl:    amLODipine (NORVASC) 10 MG tablet, TAKE 1 TABLET BY MOUTH EVERY DAY, Disp: 90 tablet, Rfl: 0   atorvastatin (LIPITOR) 80 MG tablet, TAKE 1 TABLET BY MOUTH EVERY DAY, Disp: 90 tablet, Rfl: 1   blood  glucose meter kit and supplies, Dispense based on patient and insurance preference. Use up to four times daily as directed. (FOR ICD-10 E10.9, E11.9)., Disp: 1 each, Rfl: 0   hydrochlorothiazide (HYDRODIURIL) 25 MG tablet, TAKE 1 TABLET (25 MG TOTAL) BY MOUTH DAILY., Disp: 90 tablet, Rfl: 0   KLOR-CON M20 20 MEQ tablet, TAKE 1 TABLET BY MOUTH EVERY DAY, Disp: 90 tablet, Rfl: 0   lisinopril (ZESTRIL) 40 MG tablet, TAKE 1 TABLET BY MOUTH EVERY DAY, Disp: 90 tablet, Rfl: 0   metoprolol tartrate (LOPRESSOR) 25 MG tablet, TAKE 1 TABLET BY MOUTH TWICE A DAY, Disp: 180 tablet, Rfl: 1   Multiple Vitamin (MULTIVITAMIN ADULT PO), Take by mouth., Disp: , Rfl:    ONETOUCH VERIO test strip, USE TO CHECK SUGAR UP TO 4 TIMES DAILY ICD-10 E10.9, E11.9, Disp: 100 strip, Rfl: 1   predniSONE (DELTASONE) 20 MG tablet, Take 1 tablet (20 mg total) by mouth daily with breakfast., Disp: 6 tablet, Rfl: 0  Observations/Objective: Patient is well-developed, well-nourished in no acute distress.  Resting comfortably  at home.  Head is normocephalic, atraumatic.  No labored breathing.  Speech is clear and coherent with logical content.  Patient is alert and oriented at baseline.    Assessment and Plan:  Gregory Malone in today with chief complaint of Gout   1. Left foot pain Continue epsom salt soaks   Meds ordered this encounter  Medications   predniSONE (DELTASONE) 20 MG tablet    Sig: Take 2 tablets (40 mg total) by mouth daily with breakfast for 5 days. 2 po daily for 5 days    Dispense:  10 tablet    Refill:  0    Order Specific Question:   Supervising Provider    Answer:   Caryl Pina A [[id number]]       Follow Up Instructions: I discussed the assessment and treatment plan with the patient. The patient was provided an opportunity to ask questions and all were answered. The patient agreed with the plan and demonstrated an understanding of the instructions.  A copy of instructions were sent to  the patient via MyChart.  The patient was advised to call back or seek an in-person evaluation if the symptoms worsen or if the condition fails to improve as anticipated.  Time:  I spent 8 minutes with the patient via telehealth technology discussing the above problems/concerns.    Gregory Hassell Done, FNP

## 2022-05-04 NOTE — Patient Instructions (Signed)
Foot Pain Many things can cause foot pain. Some common causes are: An injury. A sprain. Arthritis. Blisters. Bunions. Follow these instructions at home: Managing pain, stiffness, and swelling If directed, put ice on the painful area: Put ice in a plastic bag. Place a towel between your skin and the bag. Leave the ice on for 20 minutes, 2-3 times a day.  Activity Do not stand or walk for long periods. Return to your normal activities as told by your health care provider. Ask your health care provider what activities are safe for you. Do stretches to relieve foot pain and stiffness as told by your health care provider. Do not lift anything that is heavier than 10 lb (4.5 kg), or the limit that you are told, until your health care provider says that it is safe. Lifting a lot of weight can put added pressure on your feet. Lifestyle Wear comfortable, supportive shoes that fit you well. Do not wear high heels. Keep your feet clean and dry. General instructions Take over-the-counter and prescription medicines only as told by your health care provider. Rub your foot gently. Pay attention to any changes in your symptoms. Keep all follow-up visits as told by your health care provider. This is important. Contact a health care provider if: Your pain does not get better after a few days of self-care. Your pain gets worse. You cannot stand on your foot. Get help right away if: Your foot is numb or tingling. Your foot or toes are swollen. Your foot or toes turn white or blue. You have warmth and redness along your foot. Summary Common causes of foot pain are injury, sprain, arthritis, blisters, or bunions. Ice, medicines, and comfortable shoes may help foot pain. Contact your health care provider if your pain does not get better after a few days of self-care. This information is not intended to replace advice given to you by your health care provider. Make sure you discuss any questions you  have with your health care provider. Document Revised: 09/08/2020 Document Reviewed: 09/08/2020 Elsevier Patient Education  [place].

## 2022-05-05 ENCOUNTER — Telehealth: Payer: PPO

## 2022-05-05 ENCOUNTER — Other Ambulatory Visit: Payer: PPO

## 2022-05-05 DIAGNOSIS — I129 Hypertensive chronic kidney disease with stage 1 through stage 4 chronic kidney disease, or unspecified chronic kidney disease: Secondary | ICD-10-CM | POA: Diagnosis not present

## 2022-05-05 DIAGNOSIS — N189 Chronic kidney disease, unspecified: Secondary | ICD-10-CM | POA: Diagnosis not present

## 2022-05-05 DIAGNOSIS — E870 Hyperosmolality and hypernatremia: Secondary | ICD-10-CM | POA: Diagnosis not present

## 2022-05-05 DIAGNOSIS — E1122 Type 2 diabetes mellitus with diabetic chronic kidney disease: Secondary | ICD-10-CM | POA: Diagnosis not present

## 2022-05-15 ENCOUNTER — Encounter: Payer: Self-pay | Admitting: Family

## 2022-05-15 ENCOUNTER — Ambulatory Visit (INDEPENDENT_AMBULATORY_CARE_PROVIDER_SITE_OTHER): Payer: PPO | Admitting: Family

## 2022-05-15 VITALS — BP 104/48 | HR 94 | Temp 98.0°F | Ht 63.0 in | Wt 243.8 lb

## 2022-05-15 DIAGNOSIS — E669 Obesity, unspecified: Secondary | ICD-10-CM | POA: Diagnosis not present

## 2022-05-15 DIAGNOSIS — J4520 Mild intermittent asthma, uncomplicated: Secondary | ICD-10-CM | POA: Diagnosis not present

## 2022-05-15 DIAGNOSIS — Z0000 Encounter for general adult medical examination without abnormal findings: Secondary | ICD-10-CM | POA: Diagnosis not present

## 2022-05-15 DIAGNOSIS — N1831 Chronic kidney disease, stage 3a: Secondary | ICD-10-CM

## 2022-05-15 DIAGNOSIS — I2510 Atherosclerotic heart disease of native coronary artery without angina pectoris: Secondary | ICD-10-CM | POA: Diagnosis not present

## 2022-05-15 DIAGNOSIS — R918 Other nonspecific abnormal finding of lung field: Secondary | ICD-10-CM | POA: Diagnosis not present

## 2022-05-15 DIAGNOSIS — M1990 Unspecified osteoarthritis, unspecified site: Secondary | ICD-10-CM

## 2022-05-15 DIAGNOSIS — E785 Hyperlipidemia, unspecified: Secondary | ICD-10-CM

## 2022-05-15 DIAGNOSIS — Z8546 Personal history of malignant neoplasm of prostate: Secondary | ICD-10-CM | POA: Diagnosis not present

## 2022-05-15 DIAGNOSIS — N529 Male erectile dysfunction, unspecified: Secondary | ICD-10-CM

## 2022-05-15 DIAGNOSIS — Z0001 Encounter for general adult medical examination with abnormal findings: Secondary | ICD-10-CM | POA: Diagnosis not present

## 2022-05-15 DIAGNOSIS — I10 Essential (primary) hypertension: Secondary | ICD-10-CM

## 2022-05-15 DIAGNOSIS — K219 Gastro-esophageal reflux disease without esophagitis: Secondary | ICD-10-CM | POA: Diagnosis not present

## 2022-05-15 DIAGNOSIS — E1169 Type 2 diabetes mellitus with other specified complication: Secondary | ICD-10-CM

## 2022-05-15 DIAGNOSIS — M5136 Other intervertebral disc degeneration, lumbar region: Secondary | ICD-10-CM

## 2022-05-15 DIAGNOSIS — M51369 Other intervertebral disc degeneration, lumbar region without mention of lumbar back pain or lower extremity pain: Secondary | ICD-10-CM

## 2022-05-15 LAB — BAYER DCA HB A1C WAIVED: HB A1C (BAYER DCA - WAIVED): 6.7 % — ABNORMAL HIGH (ref 4.8–5.6)

## 2022-05-15 MED ORDER — SILDENAFIL CITRATE 100 MG PO TABS: 50.0000 mg | ORAL_TABLET | Freq: Every day | ORAL | 1 refills | Status: DC | PRN

## 2022-05-15 MED ORDER — LISINOPRIL 40 MG PO TABS: 40.0000 mg | ORAL_TABLET | Freq: Every day | ORAL | 2 refills | Status: DC

## 2022-05-15 MED ORDER — METOPROLOL TARTRATE 25 MG PO TABS: 25.0000 mg | ORAL_TABLET | Freq: Two times a day (BID) | ORAL | 1 refills | Status: DC

## 2022-05-15 MED ORDER — AMLODIPINE BESYLATE 10 MG PO TABS: 10.0000 mg | ORAL_TABLET | Freq: Every day | ORAL | 2 refills | Status: DC

## 2022-05-15 MED ORDER — ATORVASTATIN CALCIUM 80 MG PO TABS: 80.0000 mg | ORAL_TABLET | Freq: Every day | ORAL | 2 refills | Status: DC

## 2022-05-15 MED ORDER — HYDROCHLOROTHIAZIDE 25 MG PO TABS: 25.0000 mg | ORAL_TABLET | Freq: Every day | ORAL | 2 refills | Status: DC

## 2022-05-15 MED ORDER — PREDNISONE 10 MG (21) PO TBPK: ORAL_TABLET | ORAL | 0 refills | Status: DC

## 2022-05-15 NOTE — Progress Notes (Signed)
Subjective:    Patient ID: Gregory Malone, male    DOB: Mar 14, 1951, 71 y.o.   MRN: [id number]  Chief Complaint  Patient presents with   Medical Management of Chronic Issues   Arthritis   Pt presents to the office today for CPE and chronic follow up. He has hx of Prostate Cancer 2013.    He has benign pulmonary nodules that are stable, last CT chest scan was 04/2019. He followed by Nephrologists for CKD stage 3 every 6 months.  He states he is having ED. Reports he is in a relationship for the first time in awhile. Requesting Viagra.  Arthritis Presents for follow-up visit. He complains of pain and stiffness. The symptoms have been stable. Affected locations include the left knee, right knee, left MCP, right MCP, right wrist and left wrist. His pain is at a severity of 1/10.  Hypertension This is a chronic problem. The current episode started more than 1 year ago. The problem has been resolved since onset. The problem is controlled. Pertinent negatives include no blurred vision, malaise/fatigue, peripheral edema or shortness of breath. Risk factors for coronary artery disease include dyslipidemia, obesity, male gender and sedentary lifestyle. The current treatment provides moderate improvement. There is no history of heart failure.  Asthma There is no cough, shortness of breath or wheezing. This is a chronic problem. The current episode started more than 1 year ago. The problem occurs intermittently. Associated symptoms include heartburn. Pertinent negatives include no malaise/fatigue. His symptoms are alleviated by rest. He reports moderate improvement on treatment. His past medical history is significant for asthma.  Gastroesophageal Reflux He complains of belching and heartburn. He reports no coughing or no wheezing. This is a chronic problem. The current episode started more than 1 year ago. The problem occurs rarely. Risk factors include obesity. He has tried a diet change for the  symptoms. The treatment provided moderate relief.  Diabetes He presents for his follow-up diabetic visit. Diabetes type: prediabetes. Pertinent negatives for diabetes include no blurred vision and no foot paresthesias. Symptoms are stable. Risk factors for coronary artery disease include dyslipidemia, diabetes mellitus, male sex, hypertension and sedentary lifestyle. He is following a generally healthy diet. (Does not check BS at home ) Eye exam is current.  Hyperlipidemia This is a chronic problem. The current episode started more than 1 year ago. The problem is controlled. Recent lipid tests were reviewed and are normal. Exacerbating diseases include obesity. Pertinent negatives include no shortness of breath. Current antihyperlipidemic treatment includes statins. The current treatment provides moderate improvement of lipids. Risk factors for coronary artery disease include dyslipidemia, diabetes mellitus, male sex, hypertension and a sedentary lifestyle.      Review of Systems  Constitutional:  Negative for malaise/fatigue.  Eyes:  Negative for blurred vision.  Respiratory:  Negative for cough, shortness of breath and wheezing.   Gastrointestinal:  Positive for heartburn.  Musculoskeletal:  Positive for arthritis and stiffness.  All other systems reviewed and are negative.      Family History  Problem Relation Age of Onset   Hypertension Mother    Prostate cancer Father 31   Hypertension Father    Hyperlipidemia Father    Uterine cancer Sister    Diabetes Sister    Colon polyps Maternal Aunt    Colon cancer Maternal Aunt    Prostate cancer Paternal Uncle    Prostate cancer Maternal Grandfather 67   Prostate cancer Cousin    Colon cancer Cousin  x 2   Stomach cancer Neg Hx    Rectal cancer Neg Hx    Pancreatic cancer Neg Hx    Esophageal cancer Neg Hx    Social History   Socioeconomic History   Marital status: Divorced    Spouse name: Not on file   Number of  children: 0   Years of education: 16   Highest education level: Bachelor's degree (e.g., BA, AB, BS)  Occupational History   Occupation: retired/[employer] [place]    Comment: Retired Media planner  Tobacco Use   Smoking status: Former    Packs/day: 1.00    Years: 10.00    Total pack years: 10.00    Types: Cigarettes    Quit date: 06/19/1992    Years since quitting: 29.9   Smokeless tobacco: Never   Tobacco comments:    quit smoking in 1994  Vaping Use   Vaping Use: Never used  Substance and Sexual Activity   Alcohol use: Yes    Alcohol/week: 2.0 standard drinks of alcohol    Types: 2 Cans of beer per week   Drug use: No   Sexual activity: Yes  Other Topics Concern   Not on file  Social History Narrative   Lives alone   Enjoys hiking and metal detector hunting   Changed to a Paleo Diet mid 2022 and no longer has diabetes   Social Determinants of Health   Financial Resource Strain: Low Risk  (06/03/2021)   Overall Financial Resource Strain (CARDIA)    Difficulty of Paying Living Expenses: Not hard at all  Food Insecurity: No Food Insecurity (06/03/2021)   Hunger Vital Sign    Worried About Running Out of Food in the Last Year: Never true    Ran Out of Food in the Last Year: Never true  Transportation Needs: No Transportation Needs (06/03/2021)   PRAPARE - Hydrologist (Medical): No    Lack of Transportation (Non-Medical): No  Physical Activity: Sufficiently Active (06/03/2021)   Exercise Vital Sign    Days of Exercise per Week: 5 days    Minutes of Exercise per Session: 60 min  Stress: No Stress Concern Present (06/03/2021)   [place]    Feeling of Stress : Not at all  Social Connections: Moderately Isolated (06/03/2021)   Social Connection and Isolation Panel [NHANES]    Frequency of Communication with Friends and Family: Three times a week    Frequency of Social Gatherings  with Friends and Family: More than three times a week    Attends Religious Services: Never    Marine scientist or Organizations: Yes    Attends Music therapist: More than 4 times per year    Marital Status: Divorced    Objective:   Physical Exam Vitals reviewed.  Constitutional:      General: He is not in acute distress.    Appearance: He is well-developed.  HENT:     Head: Normocephalic.     Right Ear: Tympanic membrane normal.     Left Ear: Tympanic membrane normal.  Eyes:     General:        Right eye: No discharge.        Left eye: No discharge.     Pupils: Pupils are equal, round, and reactive to light.  Neck:     Thyroid: No thyromegaly.  Cardiovascular:     Rate and Rhythm: Normal rate  and regular rhythm.     Heart sounds: Normal heart sounds. No murmur heard. Pulmonary:     Effort: Pulmonary effort is normal. No respiratory distress.     Breath sounds: Normal breath sounds. No wheezing.  Abdominal:     General: Bowel sounds are normal. There is no distension.     Palpations: Abdomen is soft.     Tenderness: There is no abdominal tenderness.  Musculoskeletal:        General: No tenderness. Normal range of motion.     Cervical back: Normal range of motion and neck supple.  Skin:    General: Skin is warm and dry.     Findings: No erythema or rash.  Neurological:     Mental Status: He is alert and oriented to person, place, and time.     Cranial Nerves: No cranial nerve deficit.     Deep Tendon Reflexes: Reflexes are normal and symmetric.  Psychiatric:        Behavior: Behavior normal.        Thought Content: Thought content normal.        Judgment: Judgment normal.       BP (!) 104/48   Pulse 94   Temp 98 F (36.7 C) (Temporal)   Ht _0  (1.6 m)   Wt 243 lb 12.8 oz (110.6 kg)   SpO2 (!) 69%   BMI 43.19 kg/m      Assessment & Plan:   Gregory Malone comes in today with chief complaint of Medical Management of Chronic Issues  and Arthritis   Diagnosis and orders addressed:  1. Diabetes mellitus type 2 in obese (HCC) - Bayer DCA Hb A1c Waived - CMP14+EGFR - CBC with Differential/Platelet - Microalbumin / creatinine urine ratio  2. Degeneration of lumbar intervertebral disc - CMP14+EGFR - CBC with Differential/Platelet  3. Stage 3a chronic kidney disease (HCC) - CMP14+EGFR - CBC with Differential/Platelet  4. Mild intermittent asthma without complication - MAU63+FHLK - CBC with Differential/Platelet  5. Pulmonary nodules - CMP14+EGFR - CBC with Differential/Platelet  6. Morbid obesity ([place]) - CMP14+EGFR - CBC with Differential/Platelet  7. Hyperlipidemia, unspecified hyperlipidemia type - CMP14+EGFR - CBC with Differential/Platelet - atorvastatin (LIPITOR) 80 MG tablet; Take 1 tablet (80 mg total) by mouth daily.  Dispense: 90 tablet; Refill: 2  8. History of prostate cancer - CMP14+EGFR - CBC with Differential/Platelet  9. Gastroesophageal reflux disease, unspecified whether esophagitis present - CMP14+EGFR - CBC with Differential/Platelet  10. Essential hypertension - CMP14+EGFR - CBC with Differential/Platelet - amLODipine (NORVASC) 10 MG tablet; Take 1 tablet (10 mg total) by mouth daily.  Dispense: 90 tablet; Refill: 2 - metoprolol tartrate (LOPRESSOR) 25 MG tablet; Take 1 tablet (25 mg total) by mouth 2 (two) times daily.  Dispense: 180 tablet; Refill: 1 - lisinopril (ZESTRIL) 40 MG tablet; Take 1 tablet (40 mg total) by mouth daily.  Dispense: 90 tablet; Refill: 2 - hydrochlorothiazide (HYDRODIURIL) 25 MG tablet; Take 1 tablet (25 mg total) by mouth daily.  Dispense: 90 tablet; Refill: 2 - predniSONE (STERAPRED UNI-PAK 21 TAB) 10 MG (21) TBPK tablet; Use as directed  Dispense: 21 tablet; Refill: 0  11. Annual physical exam - Bayer DCA Hb A1c Waived - CMP14+EGFR - CBC with Differential/Platelet - Lipid panel - Microalbumin / creatinine urine ratio - PSA, total and free -  TSH  12. Coronary artery disease involving native heart without angina pectoris, unspecified vessel or lesion type - metoprolol tartrate (LOPRESSOR) 25 MG tablet; Take  1 tablet (25 mg total) by mouth 2 (two) times daily.  Dispense: 180 tablet; Refill: 1  13. Erectile dysfunction, unspecified erectile dysfunction type Will give Viagra as needed Good rx coupon given  - sildenafil (VIAGRA) 100 MG tablet; Take 0.5-1 tablets (50-100 mg total) by mouth daily as needed for erectile dysfunction.  Dispense: 30 tablet; Refill: 1  14. Arthritis Will give prednisone to take as needed  Can not tolerate NSAIDs - predniSONE (STERAPRED UNI-PAK 21 TAB) 10 MG (21) TBPK tablet; Use as directed  Dispense: 21 tablet; Refill: 0   Labs pending Health Maintenance reviewed Diet and exercise encouraged  Follow up plan: 6 months    Evelina Dun, FNP

## 2022-05-15 NOTE — Patient Instructions (Signed)
Arthritis Arthritis is a term that is commonly used to refer to joint pain or joint disease. There are more than 100 types of arthritis. What are the causes? The most common cause of this condition is wear and tear of a joint. Other causes include: Gout. Inflammation of a joint. An infection of a joint. Sprains and other injuries near the joint. A reaction to medicines or drugs, or an allergic reaction. In some cases, the cause may not be known. What are the signs or symptoms? The main symptom of this condition is pain in the joint during movement. Other symptoms include: Redness, swelling, or stiffness at a joint. Warmth coming from the joint. Fever. Overall feeling of illness. How is this diagnosed? This condition may be diagnosed with a physical exam and tests, including: Blood tests. Urine tests. Imaging tests, such as X-rays, an MRI, or a CT scan. Sometimes, fluid is removed from a joint for testing. How is this treated? This condition may be treated with: Treatment of the cause, if it is known. Rest. Raising (elevating) the joint. Applying cold or hot packs to the joint. Medicines to improve symptoms and reduce inflammation. Injections of a steroid, such as cortisone, into the joint to help reduce pain and inflammation. Depending on the cause of your arthritis, you may need to make lifestyle changes to reduce stress on your joint. Changes may include: Exercising more. Losing weight. Follow these instructions at home: Medicines Take over-the-counter and prescription medicines only as told by your health care provider. Do not take aspirin to relieve pain if your health care provider thinks that gout may be causing your pain. Activity Rest your joint if told by your health care provider. Rest is important when your disease is active and your joint feels painful, swollen, or stiff. Avoid activities that make the pain worse. Balance activity with rest. Exercise your joint  regularly with range-of-motion exercises as told by your health care provider. Try doing low-impact exercise, such as: Swimming. Water aerobics. Biking. Walking. Managing pain, stiffness, and swelling     If directed, put ice on the affected joint. To do this: Put ice in a plastic bag. Place a towel between your skin and the bag. Leave the ice on for 20 minutes, 2-3 times a day. Remove the ice if your skin turns bright red. This is very important. If you cannot feel pain, heat, or cold, you have a greater risk of damage to the area. If your joint is swollen, raise (elevate) it above the level of your heart if directed by your health care provider. If your joint feels stiff in the morning, try taking a warm shower. If directed, apply heat to the affected area as often as told by your health care provider. Use the heat source that your health care provider recommends, such as a moist heat pack or a heating pad. To apply heat: Place a towel between your skin and the heat source. Leave the heat on for 20-30 minutes. Remove the heat if your skin turns bright red. This is especially important if you are unable to feel pain, heat, or cold. You have a greater risk of getting burned. General instructions Maintain a healthy weight. Follow instructions from your health care provider for weight control. Do not use any products that contain nicotine or tobacco. These products include cigarettes, chewing tobacco, and vaping devices, such as e-cigarettes. If you need help quitting, ask your health care provider. Keep all follow-up visits. This is important. Where   to find more information National Institutes of Health: www.niams.nih.gov Contact a health care provider if: The pain gets worse. You have a fever. Get help right away if: You develop severe joint pain, swelling, or redness. Many joints become painful and swollen. You develop severe back pain. You develop severe weakness in your  leg. Summary Arthritis is a term that is commonly used to refer to joint pain or joint disease. There are more than 100 types of arthritis. The most common cause of this condition is wear and tear of a joint. Other causes include gout, inflammation or infection of the joint, sprains, or allergies. Symptoms of this condition include redness, swelling, or stiffness of the joint. Other symptoms include warmth, fever, or feeling ill. This condition is treated with rest, elevation, medicines, and applying cold or hot packs. Follow your health care provider's instructions about medicines, activity, exercises, and other home care treatments. This information is not intended to replace advice given to you by your health care provider. Make sure you discuss any questions you have with your health care provider. Document Revised: 03/15/2021 Document Reviewed: 03/15/2021 Elsevier Patient Education  2023 Elsevier Inc.  

## 2022-05-16 LAB — CBC WITH DIFFERENTIAL/PLATELET
Basophils Absolute: 0 10*3/uL (ref 0.0–0.2)
Basos: 0 %
EOS (ABSOLUTE): 0.3 10*3/uL (ref 0.0–0.4)
Eos: 2 %
Hematocrit: 47.2 % (ref 37.5–51.0)
Hemoglobin: 15.8 g/dL (ref 13.0–17.7)
Immature Grans (Abs): 0 10*3/uL (ref 0.0–0.1)
Immature Granulocytes: 0 %
Lymphocytes Absolute: 1.5 10*3/uL (ref 0.7–3.1)
Lymphs: 13 %
MCH: 30.4 pg (ref 26.6–33.0)
MCHC: 33.5 g/dL (ref 31.5–35.7)
MCV: 91 fL (ref 79–97)
Monocytes Absolute: 0.7 10*3/uL (ref 0.1–0.9)
Monocytes: 6 %
Neutrophils Absolute: 8.9 10*3/uL — ABNORMAL HIGH (ref 1.4–7.0)
Neutrophils: 79 %
Platelets: 256 10*3/uL (ref 150–450)
RBC: 5.19 x10E6/uL (ref 4.14–5.80)
RDW: 13.7 % (ref 11.6–15.4)
WBC: 11.5 10*3/uL — ABNORMAL HIGH (ref 3.4–10.8)

## 2022-05-16 LAB — CMP14+EGFR
ALT: 15 IU/L (ref 0–44)
AST: 15 IU/L (ref 0–40)
Albumin/Globulin Ratio: 1.5 (ref 1.2–2.2)
Albumin: 4.1 g/dL (ref 3.9–4.9)
Alkaline Phosphatase: 87 IU/L (ref 44–121)
BUN/Creatinine Ratio: 18 (ref 10–24)
BUN: 24 mg/dL (ref 8–27)
Bilirubin Total: 0.6 mg/dL (ref 0.0–1.2)
CO2: 25 mmol/L (ref 20–29)
Calcium: 10 mg/dL (ref 8.6–10.2)
Chloride: 102 mmol/L (ref 96–106)
Creatinine, Ser: 1.33 mg/dL — ABNORMAL HIGH (ref 0.76–1.27)
Globulin, Total: 2.8 g/dL (ref 1.5–4.5)
Glucose: 124 mg/dL — ABNORMAL HIGH (ref 70–99)
Potassium: 3.9 mmol/L (ref 3.5–5.2)
Sodium: 143 mmol/L (ref 134–144)
Total Protein: 6.9 g/dL (ref 6.0–8.5)
eGFR: 58 mL/min/{1.73_m2} — ABNORMAL LOW (ref >59)

## 2022-05-16 LAB — MICROALBUMIN / CREATININE URINE RATIO
Creatinine, Urine: 67.2 mg/dL
Microalb/Creat Ratio: <4 mg/g creat (ref 0–29)
Microalbumin, Urine: <3 ug/mL

## 2022-05-16 LAB — LIPID PANEL
Chol/HDL Ratio: 3.6 ratio (ref 0.0–5.0)
Cholesterol, Total: 135 mg/dL (ref 100–199)
HDL: 38 mg/dL — ABNORMAL LOW (ref >39)
LDL Chol Calc (NIH): 69 mg/dL (ref 0–99)
Triglycerides: 166 mg/dL — ABNORMAL HIGH (ref 0–149)
VLDL Cholesterol Cal: 28 mg/dL (ref 5–40)

## 2022-05-16 LAB — PSA, TOTAL AND FREE
PSA, Free Pct: 15 %
PSA, Free: 0.09 ng/mL
Prostate Specific Ag, Serum: 0.6 ng/mL (ref 0.0–4.0)

## 2022-05-16 LAB — TSH: TSH: 2.27 u[IU]/mL (ref 0.450–4.500)

## 2022-05-17 ENCOUNTER — Encounter: Payer: Self-pay | Admitting: Family Medicine

## 2022-05-17 NOTE — Progress Notes (Signed)
Patient returning call. Please call back

## 2022-06-06 ENCOUNTER — Ambulatory Visit (INDEPENDENT_AMBULATORY_CARE_PROVIDER_SITE_OTHER): Payer: PPO

## 2022-06-06 VITALS — Ht 63.0 in | Wt 243.0 lb

## 2022-06-06 DIAGNOSIS — Z0000 Encounter for general adult medical examination without abnormal findings: Secondary | ICD-10-CM

## 2022-06-06 NOTE — Progress Notes (Signed)
Subjective:   Gregory Malone is a 71 y.o. male who presents for Medicare Annual/Subsequent preventive examination. I connected with  Gregory Malone on 06/06/22 by a audio enabled telemedicine application and verified that I am speaking with the correct person using two identifiers.  Patient Location: Home  Provider Location: Home Office  I discussed the limitations of evaluation and management by telemedicine. The patient expressed understanding and agreed to proceed.  Review of Systems     Cardiac Risk Factors include: advanced age (>74mn, >>72women);diabetes mellitus;dyslipidemia;male gender;hypertension     Objective:    Today's Vitals   06/06/22 1032  Weight: 243 lb (110.2 kg)  Height: _0  (1.6 m)   Body mass index is 43.05 kg/m.     06/06/2022   10:39 AM 06/03/2021   10:50 AM 06/02/2020    9:41 AM 10/06/2019    9:37 PM 08/11/2019    4:37 PM 06/02/2019    9:42 AM 01/10/2018    9:23 AM  Advanced Directives  Does Patient Have a Medical Advance Directive? Yes Yes No No Yes Yes No  Type of AParamed[place]of [place] will [place] will    Living will   Does patient want to make changes to medical advance directive? No - Patient declined     No - Patient declined   Copy of [place]? Yes - validated most recent copy scanned in chart (See row information) Yes - validated most recent copy scanned in chart (See row information)       Would patient like information on creating a medical advance directive?   No - Patient declined No - Patient declined  No - Patient declined No - Patient declined    Current Medications (verified) Outpatient Encounter Medications as of 06/06/2022  Medication Sig   acetaminophen (TYLENOL) 500 MG tablet Take 500 mg by mouth every 6 (six) hours as needed.   amLODipine (NORVASC) 10 MG tablet Take 1 tablet (10 mg total) by mouth daily.   atorvastatin  (LIPITOR) 80 MG tablet Take 1 tablet (80 mg total) by mouth daily.   blood glucose meter kit and supplies Dispense based on patient and insurance preference. Use up to four times daily as directed. (FOR ICD-10 E10.9, E11.9).   hydrochlorothiazide (HYDRODIURIL) 25 MG tablet Take 1 tablet (25 mg total) by mouth daily.   KLOR-CON M20 20 MEQ tablet TAKE 1 TABLET BY MOUTH EVERY DAY   lisinopril (ZESTRIL) 40 MG tablet Take 1 tablet (40 mg total) by mouth daily.   metoprolol tartrate (LOPRESSOR) 25 MG tablet Take 1 tablet (25 mg total) by mouth 2 (two) times daily.   Multiple Vitamin (MULTIVITAMIN ADULT PO) Take by mouth.   ONETOUCH VERIO test strip USE TO CHECK SUGAR UP TO 4 TIMES DAILY ICD-10 E10.9, E11.9   predniSONE (STERAPRED UNI-PAK 21 TAB) 10 MG (21) TBPK tablet Use as directed   sildenafil (VIAGRA) 100 MG tablet Take 0.5-1 tablets (50-100 mg total) by mouth daily as needed for erectile dysfunction.   No facility-administered encounter medications on file as of 06/06/2022.    Allergies (verified) Nsaids   History: Past Medical History:  Diagnosis Date   Arthritis    Asthma    Bowel obstruction (HCC)    CAD (coronary artery disease)    Chronic kidney disease    nephrolithiasis  couple episodes 10 years ago   GERD (gastroesophageal reflux disease)    Hx of colonic  polyp    Hypercholesterolemia    Hypertension    Prostate cancer ([place]) 03/26/12 bx   ,gleason=3+3=6,PSA=4.22,volume=34cc   Pulmonary nodule    Radiation 06/27/2012-08/20/2012   prostate 7600 cGy 38 sessions   Past Surgical History:  Procedure Laterality Date   COLONOSCOPY  11/28/2017   Dr.Danis   POLYPECTOMY     PROSTATE BIOPSY  03/26/2012   adenocarcinoma   TONSILLECTOMY     UPPER GASTROINTESTINAL ENDOSCOPY     Family History  Problem Relation Age of Onset   Hypertension Mother    Prostate cancer Father 56   Hypertension Father    Hyperlipidemia Father    Uterine cancer Sister    Diabetes Sister    Colon  polyps Maternal Aunt    Colon cancer Maternal Aunt    Prostate cancer Paternal Uncle    Prostate cancer Maternal Grandfather 50   Prostate cancer Cousin    Colon cancer Cousin        x 2   Stomach cancer Neg Hx    Rectal cancer Neg Hx    Pancreatic cancer Neg Hx    Esophageal cancer Neg Hx    Social History   Socioeconomic History   Marital status: Divorced    Spouse name: Not on file   Number of children: 0   Years of education: 16   Highest education level: Bachelor's degree (e.g., BA, AB, BS)  Occupational History   Occupation: retired/[employer]    Comment: Retired Media planner  Tobacco Use   Smoking status: Former    Packs/day: 1.00    Years: 10.00    Total pack years: 10.00    Types: Cigarettes    Quit date: 06/19/1992    Years since quitting: 29.9   Smokeless tobacco: Never   Tobacco comments:    quit smoking in 1994  Vaping Use   Vaping Use: Never used  Substance and Sexual Activity   Alcohol use: Yes    Alcohol/week: 2.0 standard drinks of alcohol    Types: 2 Cans of beer per week   Drug use: No   Sexual activity: Yes  Other Topics Concern   Not on file  Social History Narrative   Lives alone   Enjoys hiking and metal detector hunting   Changed to a Paleo Diet mid 2022 and no longer has diabetes   Social Determinants of Health   Financial Resource Strain: Low Risk  (06/06/2022)   Overall Financial Resource Strain (CARDIA)    Difficulty of Paying Living Expenses: Not hard at all  Food Insecurity: No Food Insecurity (06/06/2022)   Hunger Vital Sign    Worried About Running Out of Food in the Last Year: Never true    Ran Out of Food in the Last Year: Never true  Transportation Needs: No Transportation Needs (06/06/2022)   PRAPARE - Hydrologist (Medical): No    Lack of Transportation (Non-Medical): No  Physical Activity: Insufficiently Active (06/06/2022)   Exercise Vital Sign    Days of Exercise per Week: 3 days     Minutes of Exercise per Session: 30 min  Stress: No Stress Concern Present (06/06/2022)   [place]    Feeling of Stress : Not at all  Social Connections: Moderately Integrated (06/06/2022)   Social Connection and Isolation Panel [NHANES]    Frequency of Communication with Friends and Family: More than three times a week    Frequency  of Social Gatherings with Friends and Family: More than three times a week    Attends Religious Services: 1 to 4 times per year    Active Member of [employer] or Organizations: Yes    Attends Archivist Meetings: 1 to 4 times per year    Marital Status: Divorced    Tobacco Counseling Counseling given: Not Answered Tobacco comments: quit smoking in 1994   Clinical Intake:  Pre-visit preparation completed: Yes  Pain : No/denies pain     Nutritional Risks: None Diabetes: Yes CBG done?: No Did pt. bring in CBG monitor from home?: No  How often do you need to have someone help you when you read instructions, pamphlets, or other written materials from your doctor or pharmacy?: 1 - Never  Diabetic?yes Nutrition Risk Assessment:  Has the patient had any N/V/D within the last 2 months?  No  Does the patient have any non-healing wounds?  No  Has the patient had any unintentional weight loss or weight gain?  No   Diabetes:  Is the patient diabetic?  Yes  If diabetic, was a CBG obtained today?  No  Did the patient bring in their glucometer from home?  No  How often do you monitor your CBG's? Daily .   Financial Strains and Diabetes Management:  Are you having any financial strains with the device, your supplies or your medication? No .  Does the patient want to be seen by Chronic Care Management for management of their diabetes?  No  Would the patient like to be referred to a Nutritionist or for Diabetic Management?  No   Diabetic Exams:  Diabetic Eye Exam: Completed  12/2021 Diabetic Foot Exam: Overdue, Pt has been advised about the importance in completing this exam. Pt is scheduled for diabetic foot exam on next office visit .   Interpreter Needed?: No  Information entered by :: Jadene Pierini, LPN   Activities of Daily Living    06/06/2022   10:39 AM  In your present state of health, do you have any difficulty performing the following activities:  Hearing? 0  Vision? 0  Difficulty concentrating or making decisions? 0  Walking or climbing stairs? 0  Dressing or bathing? 0  Doing errands, shopping? 0  Preparing Food and eating ? N  Using the Toilet? N  In the past six months, have you accidently leaked urine? N  Do you have problems with loss of bowel control? N  Managing your Medications? N  Managing your Finances? N  Housekeeping or managing your Housekeeping? N    Patient Care Team: Sharion Balloon, FNP as PCP - General (Family Medicine) Lavera Guise, [facility] as Pharmacist (Family Medicine) Celestia Khat, [facility] (Optometry)  Indicate any recent Medical Services you may have received from other than [facility] providers in the past year (date may be approximate).     Assessment:   This is a routine wellness examination for TRUE.  Hearing/Vision screen Vision Screening - Comments:: Wears rx glasses - up to date with routine eye exams with  [facility]   Dietary issues and exercise activities discussed: Current Exercise Habits: Home exercise routine, Type of exercise: walking, Time (Minutes): 30, Frequency (Times/Week): 3, Weekly Exercise (Minutes/Week): 90, Intensity: Mild, Exercise limited by: orthopedic condition(s)   Goals Addressed             This Visit's Progress    DIET - INCREASE WATER INTAKE   On track    Try to  drink 6-8 glasses of water daily       Depression Screen    06/06/2022   10:38 AM 05/15/2022   10:19 AM 11/11/2021   10:12 AM 06/03/2021   10:51 AM 05/02/2021   10:14 AM 01/28/2021    2:09 PM 10/04/2020    10:04 AM  PHQ 2/9 Scores  PHQ - 2 Score 0 0 0 0 0 0 0  PHQ- 9 Score 0 0         Fall Risk    06/06/2022   10:36 AM 05/15/2022   10:19 AM 03/13/2022    2:05 PM 11/11/2021   10:12 AM 06/03/2021   10:52 AM  Fall Risk   Falls in the past year? 0 0 0 0 0  Number falls in past yr: 0    0  Injury with Fall? 0    0  Risk for fall due to : No Fall Risks    No Fall Risks  Follow up Falls prevention discussed    Falls prevention discussed    FALL RISK PREVENTION PERTAINING TO THE HOME:  Any stairs in or around the home? Yes  If so, are there any without handrails? No  Home free of loose throw rugs in walkways, pet beds, electrical cords, etc? Yes  Adequate lighting in your home to reduce risk of falls? Yes   ASSISTIVE DEVICES UTILIZED TO PREVENT FALLS:  Life alert? No  Use of a cane, walker or w/c? Yes  Grab bars in the bathroom? No  Shower chair or bench in shower? No  Elevated toilet seat or a handicapped toilet? No       01/10/2018    9:34 AM  MMSE - Mini Mental State Exam  Orientation to time 5  Orientation to Place 5  Registration 3  Attention/ Calculation 5  Recall 2  Language- name 2 objects 2  Language- repeat 1  Language- follow 3 step command 3  Language- read & follow direction 1  Write a sentence 1  Copy design 1  Total score 29        06/06/2022   10:39 AM 06/02/2020    9:42 AM 06/02/2019    9:48 AM  6CIT Screen  What Year? 0 points 0 points 0 points  What month? 0 points 0 points 0 points  What time? 0 points 0 points 0 points  Count back from 20 0 points 0 points 0 points  Months in reverse 0 points 0 points 0 points  Repeat phrase 0 points 0 points 0 points  Total Score 0 points 0 points 0 points    Immunizations Immunization History  Administered Date(s) Administered   Fluad Quad(high Dose 65+) 03/15/2020   Moderna Sars-Covid-2 Vaccination 07/31/2019, 08/29/2019, 05/12/2020   Pneumococcal Conjugate-13 01/10/2018   Pneumococcal  Polysaccharide-23 04/29/2019   Tdap 04/22/2018    TDAP status: Up to date  Flu Vaccine status: Up to date  Pneumococcal vaccine status: Up to date  Covid-19 vaccine status: Completed vaccines  Qualifies for Shingles Vaccine? Yes   Zostavax completed No   Shingrix Completed?: Yes  Screening Tests Health Maintenance  Topic Date Due   OPHTHALMOLOGY EXAM  02/16/2022   COVID-19 Vaccine (4 - 2023-24 season) 02/17/2022   Zoster Vaccines- Shingrix (1 of 2) 08/15/2022 (Originally 06/03/1970)   INFLUENZA VACCINE  09/17/2022 (Originally 01/17/2022)   HEMOGLOBIN A1C  11/13/2022   Diabetic kidney evaluation - eGFR measurement  05/16/2023   Diabetic kidney evaluation - Urine ACR  05/16/2023  FOOT EXAM  05/16/2023   Medicare Annual Wellness (AWV)  06/07/2023   COLONOSCOPY (Pts 45-7yr Insurance coverage will need to be confirmed)  06/16/2024   DTaP/Tdap/Td (2 - Td or Tdap) 04/22/2028   Pneumonia Vaccine 70 Years old  Completed   Hepatitis C Screening  Completed   HPV VACCINES  Aged Out    Health Maintenance  Health Maintenance Due  Topic Date Due   OPHTHALMOLOGY EXAM  02/16/2022   COVID-19 Vaccine (4 - 2023-24 season) 02/17/2022    Colorectal cancer screening: Type of screening: Colonoscopy. Completed 06/16/2021. Repeat every 3 years  Lung Cancer Screening: (Low Dose CT Chest recommended if Age 70-80years, 30 pack-year currently smoking OR have quit w/in 15years.) does not qualify.   Lung Cancer Screening Referral: n/a  Additional Screening:  Hepatitis C Screening: does not qualify;   Vision Screening: Recommended annual ophthalmology exams for early detection of glaucoma and other disorders of the eye. Is the patient up to date with their annual eye exam?  Yes  Who is the provider or what is the name of the office in which the patient attends annual eye exams? [facility]   Dental Screening: Recommended annual dental exams for proper oral hygiene  Community Resource  Referral / Chronic Care Management: CRR required this visit?  No   CCM required this visit?  No      Plan:     I have personally reviewed and noted the following in the patient's chart:   Medical and social history Use of alcohol, tobacco or illicit drugs  Current medications and supplements including opioid prescriptions. Patient is not currently taking opioid prescriptions. Functional ability and status Nutritional status Physical activity Advanced directives List of other physicians Hospitalizations, surgeries, and ER visits in previous 12 months Vitals Screenings to include cognitive, depression, and falls Referrals and appointments  In addition, I have reviewed and discussed with patient certain preventive protocols, quality metrics, and best practice recommendations. A written personalized care plan for preventive services as well as general preventive health recommendations were provided to patient.     LDaphane Shepherd LPN   [id number]  Nurse Notes: none

## 2022-06-06 NOTE — Patient Instructions (Signed)
Mr. Gregory Malone , Thank you for taking time to come for your Medicare Wellness Visit. I appreciate your ongoing commitment to your health goals. Please review the following plan we discussed and let me know if I can assist you in the future.   These are the goals we discussed:  Goals       DIET - INCREASE WATER INTAKE      Try to drink 6-8 glasses of water daily      Patient Stated      06/02/2020 AWV Goal: Exercise for General Health  Patient will verbalize understanding of the benefits of increased physical activity: Exercising regularly is important. It will improve your overall fitness, flexibility, and endurance. Regular exercise also will improve your overall health. It can help you control your weight, reduce stress, and improve your bone density. Over the next year, patient will increase physical activity as tolerated with a goal of at least 150 minutes of moderate physical activity per week.  You can tell that you are exercising at a moderate intensity if your heart starts beating faster and you start breathing faster but can still hold a conversation. Moderate-intensity exercise ideas include: Walking 1 mile (1.6 km) in about 15 minutes Biking Hiking Golfing Dancing Water aerobics Patient will verbalize understanding of everyday activities that increase physical activity by providing examples like the following: Yard work, such as: Sales promotion account executive Gardening Washing windows or floors Patient will be able to explain general safety guidelines for exercising:  Before you start a new exercise program, talk with your health care provider. Do not exercise so much that you hurt yourself, feel dizzy, or get very short of breath. Wear comfortable clothes and wear shoes with good support. Drink plenty of water while you exercise to prevent dehydration or heat stroke. Work out until your breathing and  your heartbeat get faster.       T2DM PHARMD GOAL (pt-stated)      Current Barriers:  Unable to maintain control of DIABETES/PRE-NEW DIAGNOSIS   Pharmacist Clinical Goal(s):  Over the next 90 days, patient will maintain control of T2DM as evidenced by IMPROVED GLYCEMIC CONTROL  through collaboration with PharmD and provider.    Interventions: 1:1 collaboration with Sharion Balloon, FNP regarding development and update of comprehensive plan of care as evidenced by provider attestation and co-signature Inter-disciplinary care team collaboration (see longitudinal plan of care) Comprehensive medication review performed; medication list updated in electronic medical record  Diabetes: Uncontrolled; current treatment: lifestyle/dietary changes;  Set up new meter for patient One touch verio--patient able to teach back No current medications Patient has lost over 20lbs and is making great progress Current glucose readings: set up new glucometer--patient to start testing (fasting) Discussed meal planning options and Plate method for healthy eating Avoid sugary drinks and desserts Incorporate balanced protein, non starchy veggies, 1 serving of carbohydrate with each meal Increase water intake Increase physical activity as able Current exercise: n/a Educated on new glucometer, patient to consider GLP1 therapy-could assist with weight loss & other health benefits   Patient Goals/Self-Care Activities Over the next 90 days, patient will:  - take medications as prescribed check glucose 3x weekly (fasting), document, and provide at future appointments  Follow Up Plan: Telephone follow up appointment with care management team member scheduled for: 6 weeks         This is a list of the screening recommended for you  and due dates:  Health Maintenance  Topic Date Due   Eye exam for diabetics  02/16/2022   COVID-19 Vaccine (4 - 2023-24 season) 02/17/2022   Zoster (Shingles) Vaccine (1 of  2) 08/15/2022*   Flu Shot  09/17/2022*   Hemoglobin A1C  11/13/2022   Yearly kidney function blood test for diabetes  05/16/2023   Yearly kidney health urinalysis for diabetes  05/16/2023   Complete foot exam   05/16/2023   Medicare Annual Wellness Visit  06/07/2023   Colon Cancer Screening  06/16/2024   DTaP/Tdap/Td vaccine (2 - Td or Tdap) 04/22/2028   Pneumonia Vaccine  Completed   Hepatitis C Screening: USPSTF Recommendation to screen - Ages 68-79 yo.  Completed   HPV Vaccine  Aged Out  *Topic was postponed. The date shown is not the original due date.    Advanced directives: Advance directive discussed with you today. I have provided a copy for you to complete at home and have notarized. Once this is complete please bring a copy in to our office so we can scan it into your chart.    Conditions/risks identified: Aim for 30 minutes of exercise or brisk walking, 6-8 glasses of water, and 5 servings of fruits and vegetables each day.   Next appointment: Follow up in one year for your annual wellness visit.   Preventive Care 22 Years and Older, Male  Preventive care refers to lifestyle choices and visits with your health care provider that can promote health and wellness. What does preventive care include? A yearly physical exam. This is also called an annual well check. Dental exams once or twice a year. Routine eye exams. Ask your health care provider how often you should have your eyes checked. Personal lifestyle choices, including: Daily care of your teeth and gums. Regular physical activity. Eating a healthy diet. Avoiding tobacco and drug use. Limiting alcohol use. Practicing safe sex. Taking low doses of aspirin every day. Taking vitamin and mineral supplements as recommended by your health care provider. What happens during an annual well check? The services and screenings done by your health care provider during your annual well check will depend on your age, overall  health, lifestyle risk factors, and family history of disease. Counseling  Your health care provider may ask you questions about your: Alcohol use. Tobacco use. Drug use. Emotional well-being. Home and relationship well-being. Sexual activity. Eating habits. History of falls. Memory and ability to understand (cognition). Work and work Statistician. Screening  You may have the following tests or measurements: Height, weight, and BMI. Blood pressure. Lipid and cholesterol levels. These may be checked every 5 years, or more frequently if you are over 85 years old. Skin check. Lung cancer screening. You may have this screening every year starting at age 12 if you have a 30-pack-year history of smoking and currently smoke or have quit within the past 15 years. Fecal occult blood test (FOBT) of the stool. You may have this test every year starting at age 86. Flexible sigmoidoscopy or colonoscopy. You may have a sigmoidoscopy every 5 years or a colonoscopy every 10 years starting at age 52. Prostate cancer screening. Recommendations will vary depending on your family history and other risks. Hepatitis C blood test. Hepatitis B blood test. Sexually transmitted disease (STD) testing. Diabetes screening. This is done by checking your blood sugar (glucose) after you have not eaten for a while (fasting). You may have this done every 1-3 years. Abdominal aortic aneurysm (AAA) screening. You  may need this if you are a current or former smoker. Osteoporosis. You may be screened starting at age 58 if you are at high risk. Talk with your health care provider about your test results, treatment options, and if necessary, the need for more tests. Vaccines  Your health care provider may recommend certain vaccines, such as: Influenza vaccine. This is recommended every year. Tetanus, diphtheria, and acellular pertussis (Tdap, Td) vaccine. You may need a Td booster every 10 years. Zoster vaccine. You may  need this after age 69. Pneumococcal 13-valent conjugate (PCV13) vaccine. One dose is recommended after age 60. Pneumococcal polysaccharide (PPSV23) vaccine. One dose is recommended after age 59. Talk to your health care provider about which screenings and vaccines you need and how often you need them. This information is not intended to replace advice given to you by your health care provider. Make sure you discuss any questions you have with your health care provider. Document Released: 07/02/2015 Document Revised: 02/23/2016 Document Reviewed: 04/06/2015 Elsevier Interactive Patient Education  2017 [place] Prevention in the Home Falls can cause injuries. They can happen to people of all ages. There are many things you can do to make your home safe and to help prevent falls. What can I do on the outside of my home? Regularly fix the edges of walkways and driveways and fix any cracks. Remove anything that might make you trip as you walk through a door, such as a raised step or threshold. Trim any bushes or trees on the path to your home. Use bright outdoor lighting. Clear any walking paths of anything that might make someone trip, such as rocks or tools. Regularly check to see if handrails are loose or broken. Make sure that both sides of any steps have handrails. Any raised decks and porches should have guardrails on the edges. Have any leaves, snow, or ice cleared regularly. Use sand or salt on walking paths during winter. Clean up any spills in your garage right away. This includes oil or grease spills. What can I do in the bathroom? Use night lights. Install grab bars by the toilet and in the tub and shower. Do not use towel bars as grab bars. Use non-skid mats or decals in the tub or shower. If you need to sit down in the shower, use a plastic, non-slip stool. Keep the floor dry. Clean up any water that spills on the floor as soon as it happens. Remove soap buildup in  the tub or shower regularly. Attach bath mats securely with double-sided non-slip rug tape. Do not have throw rugs and other things on the floor that can make you trip. What can I do in the bedroom? Use night lights. Make sure that you have a light by your bed that is easy to reach. Do not use any sheets or blankets that are too big for your bed. They should not hang down onto the floor. Have a firm chair that has side arms. You can use this for support while you get dressed. Do not have throw rugs and other things on the floor that can make you trip. What can I do in the kitchen? Clean up any spills right away. Avoid walking on wet floors. Keep items that you use a lot in easy-to-reach places. If you need to reach something above you, use a strong step stool that has a grab bar. Keep electrical cords out of the way. Do not use floor polish or wax that  makes floors slippery. If you must use wax, use non-skid floor wax. Do not have throw rugs and other things on the floor that can make you trip. What can I do with my stairs? Do not leave any items on the stairs. Make sure that there are handrails on both sides of the stairs and use them. Fix handrails that are broken or loose. Make sure that handrails are as long as the stairways. Check any carpeting to make sure that it is firmly attached to the stairs. Fix any carpet that is loose or worn. Avoid having throw rugs at the top or bottom of the stairs. If you do have throw rugs, attach them to the floor with carpet tape. Make sure that you have a light switch at the top of the stairs and the bottom of the stairs. If you do not have them, ask someone to add them for you. What else can I do to help prevent falls? Wear shoes that: Do not have high heels. Have rubber bottoms. Are comfortable and fit you well. Are closed at the toe. Do not wear sandals. If you use a stepladder: Make sure that it is fully opened. Do not climb a closed  stepladder. Make sure that both sides of the stepladder are locked into place. Ask someone to hold it for you, if possible. Clearly mark and make sure that you can see: Any grab bars or handrails. First and last steps. Where the edge of each step is. Use tools that help you move around (mobility aids) if they are needed. These include: Canes. Walkers. Scooters. Crutches. Turn on the lights when you go into a dark area. Replace any light bulbs as soon as they burn out. Set up your furniture so you have a clear path. Avoid moving your furniture around. If any of your floors are uneven, fix them. If there are any pets around you, be aware of where they are. Review your medicines with your doctor. Some medicines can make you feel dizzy. This can increase your chance of falling. Ask your doctor what other things that you can do to help prevent falls. This information is not intended to replace advice given to you by your health care provider. Make sure you discuss any questions you have with your health care provider. Document Released: 04/01/2009 Document Revised: 11/11/2015 Document Reviewed: 07/10/2014 Elsevier Interactive Patient Education  2017 [employer].

## 2022-06-14 DIAGNOSIS — E1122 Type 2 diabetes mellitus with diabetic chronic kidney disease: Secondary | ICD-10-CM | POA: Diagnosis not present

## 2022-06-14 DIAGNOSIS — N189 Chronic kidney disease, unspecified: Secondary | ICD-10-CM | POA: Diagnosis not present

## 2022-06-14 DIAGNOSIS — I129 Hypertensive chronic kidney disease with stage 1 through stage 4 chronic kidney disease, or unspecified chronic kidney disease: Secondary | ICD-10-CM | POA: Diagnosis not present

## 2022-06-14 DIAGNOSIS — D72829 Elevated white blood cell count, unspecified: Secondary | ICD-10-CM | POA: Diagnosis not present

## 2022-06-14 DIAGNOSIS — E870 Hyperosmolality and hypernatremia: Secondary | ICD-10-CM | POA: Diagnosis not present

## 2022-07-02 ENCOUNTER — Other Ambulatory Visit: Payer: Self-pay | Admitting: Family

## 2022-07-26 DIAGNOSIS — D3132 Benign neoplasm of left choroid: Secondary | ICD-10-CM | POA: Diagnosis not present

## 2022-07-26 DIAGNOSIS — H43811 Vitreous degeneration, right eye: Secondary | ICD-10-CM | POA: Diagnosis not present

## 2022-07-26 DIAGNOSIS — E119 Type 2 diabetes mellitus without complications: Secondary | ICD-10-CM | POA: Diagnosis not present

## 2022-07-26 LAB — HM DIABETES EYE EXAM

## 2022-10-16 ENCOUNTER — Telehealth: Payer: Self-pay | Admitting: Family

## 2022-10-16 ENCOUNTER — Encounter: Payer: Self-pay | Admitting: Family

## 2022-10-16 NOTE — Telephone Encounter (Signed)
List up front patient aware

## 2022-11-20 ENCOUNTER — Ambulatory Visit (INDEPENDENT_AMBULATORY_CARE_PROVIDER_SITE_OTHER): Payer: PPO | Admitting: Family

## 2022-11-20 ENCOUNTER — Encounter: Payer: Self-pay | Admitting: Family

## 2022-11-20 VITALS — BP 107/71 | HR 87 | Temp 97.6°F | Resp 20 | Ht 63.0 in | Wt 244.0 lb

## 2022-11-20 DIAGNOSIS — I2510 Atherosclerotic heart disease of native coronary artery without angina pectoris: Secondary | ICD-10-CM

## 2022-11-20 DIAGNOSIS — K219 Gastro-esophageal reflux disease without esophagitis: Secondary | ICD-10-CM

## 2022-11-20 DIAGNOSIS — E1169 Type 2 diabetes mellitus with other specified complication: Secondary | ICD-10-CM

## 2022-11-20 DIAGNOSIS — E785 Hyperlipidemia, unspecified: Secondary | ICD-10-CM | POA: Diagnosis not present

## 2022-11-20 DIAGNOSIS — Z6841 Body Mass Index (BMI) 40.0 and over, adult: Secondary | ICD-10-CM

## 2022-11-20 DIAGNOSIS — N1831 Chronic kidney disease, stage 3a: Secondary | ICD-10-CM

## 2022-11-20 DIAGNOSIS — E669 Obesity, unspecified: Secondary | ICD-10-CM

## 2022-11-20 DIAGNOSIS — J4520 Mild intermittent asthma, uncomplicated: Secondary | ICD-10-CM | POA: Diagnosis not present

## 2022-11-20 DIAGNOSIS — I10 Essential (primary) hypertension: Secondary | ICD-10-CM | POA: Diagnosis not present

## 2022-11-20 DIAGNOSIS — E1122 Type 2 diabetes mellitus with diabetic chronic kidney disease: Secondary | ICD-10-CM | POA: Diagnosis not present

## 2022-11-20 LAB — BAYER DCA HB A1C WAIVED: HB A1C (BAYER DCA - WAIVED): 6.5 % — ABNORMAL HIGH (ref 4.8–5.6)

## 2022-11-20 NOTE — Patient Instructions (Signed)
Health Maintenance After Age 71 After age 71, you are at a higher risk for certain long-term diseases and infections as well as injuries from falls. Falls are a major cause of broken bones and head injuries in people who are older than age 71. Getting regular preventive care can help to keep you healthy and well. Preventive care includes getting regular testing and making lifestyle changes as recommended by your health care provider. Talk with your health care provider about: Which screenings and tests you should have. A screening is a test that checks for a disease when you have no symptoms. A diet and exercise plan that is right for you. What should I know about screenings and tests to prevent falls? Screening and testing are the best ways to find a health problem early. Early diagnosis and treatment give you the best chance of managing medical conditions that are common after age 71. Certain conditions and lifestyle choices may make you more likely to have a fall. Your health care provider may recommend: Regular vision checks. Poor vision and conditions such as cataracts can make you more likely to have a fall. If you wear glasses, make sure to get your prescription updated if your vision changes. Medicine review. Work with your health care provider to regularly review all of the medicines you are taking, including over-the-counter medicines. Ask your health care provider about any side effects that may make you more likely to have a fall. Tell your health care provider if any medicines that you take make you feel dizzy or sleepy. Strength and balance checks. Your health care provider may recommend certain tests to check your strength and balance while standing, walking, or changing positions. Foot health exam. Foot pain and numbness, as well as not wearing proper footwear, can make you more likely to have a fall. Screenings, including: Osteoporosis screening. Osteoporosis is a condition that causes  the bones to get weaker and break more easily. Blood pressure screening. Blood pressure changes and medicines to control blood pressure can make you feel dizzy. Depression screening. You may be more likely to have a fall if you have a fear of falling, feel depressed, or feel unable to do activities that you used to do. Alcohol use screening. Using too much alcohol can affect your balance and may make you more likely to have a fall. Follow these instructions at home: Lifestyle Do not drink alcohol if: Your health care provider tells you not to drink. If you drink alcohol: Limit how much you have to: 0-1 drink a day for women. 0-2 drinks a day for men. Know how much alcohol is in your drink. In the [place] one drink equals one 12 oz bottle of beer (355 mL), one 5 oz glass of wine (148 mL), or one 1 oz glass of hard liquor (44 mL). Do not use any products that contain nicotine or tobacco. These products include cigarettes, chewing tobacco, and vaping devices, such as e-cigarettes. If you need help quitting, ask your health care provider. Activity  Follow a regular exercise program to stay fit. This will help you maintain your balance. Ask your health care provider what types of exercise are appropriate for you. If you need a cane or walker, use it as recommended by your health care provider. Wear supportive shoes that have nonskid soles. Safety  Remove any tripping hazards, such as rugs, cords, and clutter. Install safety equipment such as grab bars in bathrooms and safety rails on stairs. Keep rooms and walkways   well-lit. General instructions Talk with your health care provider about your risks for falling. Tell your health care provider if: You fall. Be sure to tell your health care provider about all falls, even ones that seem minor. You feel dizzy, tiredness (fatigue), or off-balance. Take over-the-counter and prescription medicines only as told by your health care provider. These include  supplements. Eat a healthy diet and maintain a healthy weight. A healthy diet includes low-fat dairy products, low-fat (lean) meats, and fiber from whole grains, beans, and lots of fruits and vegetables. Stay current with your vaccines. Schedule regular health, dental, and eye exams. Summary Having a healthy lifestyle and getting preventive care can help to protect your health and wellness after age 71. Screening and testing are the best way to find a health problem early and help you avoid having a fall. Early diagnosis and treatment give you the best chance for managing medical conditions that are more common for people who are older than age 71. Falls are a major cause of broken bones and head injuries in people who are older than age 71. Take precautions to prevent a fall at home. Work with your health care provider to learn what changes you can make to improve your health and wellness and to prevent falls. This information is not intended to replace advice given to you by your health care provider. Make sure you discuss any questions you have with your health care provider. Document Revised: 10/25/2020 Document Reviewed: 10/25/2020 Elsevier Patient Education  2024 Else[employer] Inc.  

## 2022-11-20 NOTE — Progress Notes (Signed)
Subjective:    Patient ID: Gregory Malone, male    DOB: 10-27-1950, 72 y.o.   MRN: [id number]  Chief Complaint  Patient presents with   Medical Management of Chronic Issues   Pt presents to the office today for chronic follow up. He has hx of Prostate Cancer 2013.    He has benign pulmonary nodules that are stable, last CT chest scan was 04/2019. He followed by Nephrologists for CKD stage 3 every 6 months.   He states he is having ED. Reports he is in a relationship for the first time in awhile. Requesting Viagra.   He has just got back from a [place] long [place].  Hypertension This is a chronic problem. The current episode started more than 1 year ago. The problem has been resolved since onset. The problem is controlled. Associated symptoms include peripheral edema. Pertinent negatives include no shortness of breath. Risk factors for coronary artery disease include diabetes mellitus, dyslipidemia, obesity and male gender. The current treatment provides moderate improvement.  Asthma There is no cough, shortness of breath or wheezing. This is a chronic problem. The current episode started more than 1 year ago. The problem occurs intermittently. The problem has been waxing and waning. He reports moderate improvement on treatment. His past medical history is significant for asthma.  Gastroesophageal Reflux He reports no belching, no coughing or no wheezing. This is a chronic problem. The current episode started more than 1 year ago. The problem has been resolved. Risk factors include obesity. He has tried a PPI for the symptoms. The treatment provided moderate relief.  Diabetes He presents for his follow-up diabetic visit. He has type 2 diabetes mellitus. Risk factors for coronary artery disease include diabetes mellitus, dyslipidemia, male sex, hypertension and sedentary lifestyle. He is following a generally unhealthy diet. His overall blood glucose range is 110-130 mg/dl. Eye  exam is not current.  Hyperlipidemia This is a chronic problem. The current episode started more than 1 year ago. The problem is controlled. Recent lipid tests were reviewed and are normal. Exacerbating diseases include obesity. Pertinent negatives include no shortness of breath. Current antihyperlipidemic treatment includes statins. The current treatment provides moderate improvement of lipids. Risk factors for coronary artery disease include dyslipidemia, diabetes mellitus, hypertension, obesity, male sex and a sedentary lifestyle.  Arthritis Presents for follow-up visit. He complains of pain and stiffness. Affected locations include the left knee. His pain is at a severity of 0/10.      Review of Systems  Respiratory:  Negative for cough, shortness of breath and wheezing.   Musculoskeletal:  Positive for arthritis and stiffness.  All other systems reviewed and are negative.      Objective:   Physical Exam Vitals reviewed.  Constitutional:      General: He is not in acute distress.    Appearance: He is well-developed. He is obese.  HENT:     Head: Normocephalic.     Right Ear: Tympanic membrane normal.     Left Ear: Tympanic membrane normal.  Eyes:     General:        Right eye: No discharge.        Left eye: No discharge.     Pupils: Pupils are equal, round, and reactive to light.  Neck:     Thyroid: No thyromegaly.  Cardiovascular:     Rate and Rhythm: Normal rate and regular rhythm.     Heart sounds: Normal heart sounds. No murmur heard. Pulmonary:  Effort: Pulmonary effort is normal. No respiratory distress.     Breath sounds: Normal breath sounds. No wheezing.  Abdominal:     General: Bowel sounds are normal. There is no distension.     Palpations: Abdomen is soft.     Tenderness: There is no abdominal tenderness.  Musculoskeletal:        General: No tenderness. Normal range of motion.     Cervical back: Normal range of motion and neck supple.     Right lower  leg: Edema (trace) present.     Left lower leg: Edema (trace) present.  Skin:    General: Skin is warm and dry.     Findings: No erythema or rash.  Neurological:     Mental Status: He is alert and oriented to person, place, and time.     Cranial Nerves: No cranial nerve deficit.     Deep Tendon Reflexes: Reflexes are normal and symmetric.  Psychiatric:        Behavior: Behavior normal.        Thought Content: Thought content normal.        Judgment: Judgment normal.       BP 107/71   Pulse 87   Temp 97.6 F (36.4 C) (Temporal)   Resp 20   Ht 5\' 3"  (1.6 m)   Wt 244 lb (110.7 kg)   SpO2 92%   BMI 43.22 kg/m      Assessment & Plan:  Gregory Malone comes in today with chief complaint of Medical Management of Chronic Issues   Diagnosis and orders addressed:  1. Mild intermittent asthma without complication - CMP14+EGFR - CBC with Differential/Platelet  2. Stage 3a chronic kidney disease (HCC) - CMP14+EGFR - CBC with Differential/Platelet  3. Coronary artery disease involving native coronary artery of native heart without angina pectoris - CMP14+EGFR - CBC with Differential/Platelet  4. Type 2 diabetes mellitus with obesity (HCC) - CMP14+EGFR - CBC with Differential/Platelet - Bayer DCA Hb A1c Waived  5. Essential hypertension - CMP14+EGFR - CBC with Differential/Platelet  6. Gastroesophageal reflux disease, unspecified whether esophagitis present - CMP14+EGFR - CBC with Differential/Platelet  7. Hyperlipidemia, unspecified hyperlipidemia type  - CMP14+EGFR - CBC with Differential/Platelet  8. Morbid obesity (HCC) - CMP14+EGFR - CBC with Differential/Platelet   Labs pending Health Maintenance reviewed Diet and exercise encouraged  Follow up plan: 6 months    Jannifer Rodney, FNP

## 2022-11-21 LAB — CBC WITH DIFFERENTIAL/PLATELET
Basophils Absolute: 0 10*3/uL (ref 0.0–0.2)
Basos: 1 %
EOS (ABSOLUTE): 0.2 10*3/uL (ref 0.0–0.4)
Eos: 3 %
Hematocrit: 46 % (ref 37.5–51.0)
Hemoglobin: 15 g/dL (ref 13.0–17.7)
Immature Grans (Abs): 0 10*3/uL (ref 0.0–0.1)
Immature Granulocytes: 0 %
Lymphocytes Absolute: 1.1 10*3/uL (ref 0.7–3.1)
Lymphs: 18 %
MCH: 29.5 pg (ref 26.6–33.0)
MCHC: 32.6 g/dL (ref 31.5–35.7)
MCV: 90 fL (ref 79–97)
Monocytes Absolute: 0.5 10*3/uL (ref 0.1–0.9)
Monocytes: 8 %
Neutrophils Absolute: 4.4 10*3/uL (ref 1.4–7.0)
Neutrophils: 70 %
Platelets: 208 10*3/uL (ref 150–450)
RBC: 5.09 x10E6/uL (ref 4.14–5.80)
RDW: 13.7 % (ref 11.6–15.4)
WBC: 6.2 10*3/uL (ref 3.4–10.8)

## 2022-11-21 LAB — CMP14+EGFR
ALT: 14 IU/L (ref 0–44)
AST: 14 IU/L (ref 0–40)
Albumin/Globulin Ratio: 1.5 (ref 1.2–2.2)
Albumin: 4 g/dL (ref 3.9–4.9)
Alkaline Phosphatase: 94 IU/L (ref 44–121)
BUN/Creatinine Ratio: 15 (ref 10–24)
BUN: 19 mg/dL (ref 8–27)
Bilirubin Total: 0.5 mg/dL (ref 0.0–1.2)
CO2: 24 mmol/L (ref 20–29)
Calcium: 9.7 mg/dL (ref 8.6–10.2)
Chloride: 101 mmol/L (ref 96–106)
Creatinine, Ser: 1.29 mg/dL — ABNORMAL HIGH (ref 0.76–1.27)
Globulin, Total: 2.6 g/dL (ref 1.5–4.5)
Glucose: 92 mg/dL (ref 70–99)
Potassium: 3.7 mmol/L (ref 3.5–5.2)
Sodium: 142 mmol/L (ref 134–144)
Total Protein: 6.6 g/dL (ref 6.0–8.5)
eGFR: 60 mL/min/{1.73_m2} (ref >59)

## 2022-12-07 DIAGNOSIS — N189 Chronic kidney disease, unspecified: Secondary | ICD-10-CM | POA: Diagnosis not present

## 2022-12-07 DIAGNOSIS — D72829 Elevated white blood cell count, unspecified: Secondary | ICD-10-CM | POA: Diagnosis not present

## 2022-12-07 DIAGNOSIS — I129 Hypertensive chronic kidney disease with stage 1 through stage 4 chronic kidney disease, or unspecified chronic kidney disease: Secondary | ICD-10-CM | POA: Diagnosis not present

## 2022-12-07 DIAGNOSIS — E1122 Type 2 diabetes mellitus with diabetic chronic kidney disease: Secondary | ICD-10-CM | POA: Diagnosis not present

## 2022-12-07 DIAGNOSIS — E870 Hyperosmolality and hypernatremia: Secondary | ICD-10-CM | POA: Diagnosis not present

## 2022-12-14 DIAGNOSIS — E1122 Type 2 diabetes mellitus with diabetic chronic kidney disease: Secondary | ICD-10-CM | POA: Diagnosis not present

## 2022-12-14 DIAGNOSIS — I129 Hypertensive chronic kidney disease with stage 1 through stage 4 chronic kidney disease, or unspecified chronic kidney disease: Secondary | ICD-10-CM | POA: Diagnosis not present

## 2022-12-14 DIAGNOSIS — N1831 Chronic kidney disease, stage 3a: Secondary | ICD-10-CM | POA: Diagnosis not present

## 2022-12-14 DIAGNOSIS — N200 Calculus of kidney: Secondary | ICD-10-CM | POA: Diagnosis not present

## 2022-12-19 ENCOUNTER — Other Ambulatory Visit: Payer: Self-pay | Admitting: Family

## 2022-12-19 DIAGNOSIS — E785 Hyperlipidemia, unspecified: Secondary | ICD-10-CM

## 2022-12-19 MED ORDER — ATORVASTATIN CALCIUM 80 MG PO TABS
80.0000 mg | ORAL_TABLET | Freq: Every day | ORAL | 2 refills | Status: DC
Start: 2022-12-19 — End: 2024-01-01

## 2023-01-01 ENCOUNTER — Other Ambulatory Visit: Payer: Self-pay | Admitting: Family

## 2023-01-06 ENCOUNTER — Other Ambulatory Visit: Payer: Self-pay | Admitting: Family

## 2023-01-22 ENCOUNTER — Other Ambulatory Visit: Payer: Self-pay | Admitting: Family

## 2023-01-22 DIAGNOSIS — M1990 Unspecified osteoarthritis, unspecified site: Secondary | ICD-10-CM

## 2023-01-22 DIAGNOSIS — I10 Essential (primary) hypertension: Secondary | ICD-10-CM

## 2023-01-23 ENCOUNTER — Other Ambulatory Visit: Payer: Self-pay | Admitting: Family

## 2023-01-23 DIAGNOSIS — M1990 Unspecified osteoarthritis, unspecified site: Secondary | ICD-10-CM

## 2023-01-23 DIAGNOSIS — I10 Essential (primary) hypertension: Secondary | ICD-10-CM

## 2023-01-23 MED ORDER — PREDNISONE 10 MG (21) PO TBPK: ORAL_TABLET | ORAL | 0 refills | Status: DC

## 2023-01-23 NOTE — Telephone Encounter (Signed)
  Prescription Request  01/23/2023  Is this a "Controlled Substance" medicine? no  Have you seen your PCP in the last 2 weeks? Pt was seen in June and states Neysa Bonito told him she would refill this when he needed it   If YES, route message to pool  -  If NO, patient needs to be scheduled for appointment.  What is the name of the medication or equipment? Prednisone for Arthritis  Have you contacted your pharmacy to request a refill? yes   Which pharmacy would you like this sent to? [facility]   Patient notified that their request is being sent to the clinical staff for review and that they should receive a response within 2 business days.

## 2023-01-26 ENCOUNTER — Other Ambulatory Visit: Payer: Self-pay | Admitting: Family

## 2023-01-26 DIAGNOSIS — I10 Essential (primary) hypertension: Secondary | ICD-10-CM

## 2023-01-30 ENCOUNTER — Other Ambulatory Visit: Payer: Self-pay | Admitting: Family

## 2023-02-13 ENCOUNTER — Other Ambulatory Visit: Payer: Self-pay | Admitting: Family

## 2023-02-13 DIAGNOSIS — I10 Essential (primary) hypertension: Secondary | ICD-10-CM

## 2023-03-14 ENCOUNTER — Encounter: Payer: Self-pay | Admitting: Pharmacist

## 2023-03-14 NOTE — Progress Notes (Signed)
Pharmacy Quality Measure Review  This patient is appearing on a report for being at risk of failing the adherence measure for cholesterol (statin) medications this calendar year.   Medication: atorvastatin 80 mg Last fill date: 01/06/23 for 90 day supply  Insurance report was not up to date. No action needed at this time.   Catie Eppie Gibson, PharmD, BCACP, CPP Clinical Pharmacist [facility] [phone]

## 2023-04-09 DIAGNOSIS — M4726 Other spondylosis with radiculopathy, lumbar region: Secondary | ICD-10-CM | POA: Diagnosis not present

## 2023-04-09 DIAGNOSIS — Z6841 Body Mass Index (BMI) 40.0 and over, adult: Secondary | ICD-10-CM | POA: Diagnosis not present

## 2023-05-01 DIAGNOSIS — M4726 Other spondylosis with radiculopathy, lumbar region: Secondary | ICD-10-CM | POA: Diagnosis not present

## 2023-05-22 ENCOUNTER — Ambulatory Visit: Payer: PPO | Admitting: Family

## 2023-06-05 ENCOUNTER — Ambulatory Visit (INDEPENDENT_AMBULATORY_CARE_PROVIDER_SITE_OTHER): Payer: PPO

## 2023-06-05 ENCOUNTER — Encounter: Payer: Self-pay | Admitting: Family

## 2023-06-05 ENCOUNTER — Ambulatory Visit: Payer: PPO | Admitting: Family

## 2023-06-05 VITALS — BP 112/66 | HR 95 | Temp 97.0°F | Ht 63.0 in | Wt 243.2 lb

## 2023-06-05 DIAGNOSIS — Z0000 Encounter for general adult medical examination without abnormal findings: Secondary | ICD-10-CM | POA: Diagnosis not present

## 2023-06-05 DIAGNOSIS — I10 Essential (primary) hypertension: Secondary | ICD-10-CM | POA: Diagnosis not present

## 2023-06-05 DIAGNOSIS — E1169 Type 2 diabetes mellitus with other specified complication: Secondary | ICD-10-CM

## 2023-06-05 DIAGNOSIS — E669 Obesity, unspecified: Secondary | ICD-10-CM | POA: Diagnosis not present

## 2023-06-05 DIAGNOSIS — M7989 Other specified soft tissue disorders: Secondary | ICD-10-CM

## 2023-06-05 DIAGNOSIS — N1831 Chronic kidney disease, stage 3a: Secondary | ICD-10-CM | POA: Diagnosis not present

## 2023-06-05 DIAGNOSIS — Z8546 Personal history of malignant neoplasm of prostate: Secondary | ICD-10-CM

## 2023-06-05 DIAGNOSIS — E785 Hyperlipidemia, unspecified: Secondary | ICD-10-CM | POA: Diagnosis not present

## 2023-06-05 DIAGNOSIS — M51362 Other intervertebral disc degeneration, lumbar region with discogenic back pain and lower extremity pain: Secondary | ICD-10-CM

## 2023-06-05 DIAGNOSIS — Z0001 Encounter for general adult medical examination with abnormal findings: Secondary | ICD-10-CM

## 2023-06-05 DIAGNOSIS — K219 Gastro-esophageal reflux disease without esophagitis: Secondary | ICD-10-CM

## 2023-06-05 DIAGNOSIS — I2510 Atherosclerotic heart disease of native coronary artery without angina pectoris: Secondary | ICD-10-CM

## 2023-06-05 DIAGNOSIS — M1812 Unilateral primary osteoarthritis of first carpometacarpal joint, left hand: Secondary | ICD-10-CM | POA: Diagnosis not present

## 2023-06-05 DIAGNOSIS — M19032 Primary osteoarthritis, left wrist: Secondary | ICD-10-CM | POA: Diagnosis not present

## 2023-06-05 LAB — BAYER DCA HB A1C WAIVED: HB A1C (BAYER DCA - WAIVED): 5.8 % — ABNORMAL HIGH (ref 4.8–5.6)

## 2023-06-05 NOTE — Addendum Note (Signed)
Addended by: Ignacia Bayley on: 06/05/2023 02:23 PM   Modules accepted: Orders

## 2023-06-05 NOTE — Patient Instructions (Signed)
Health Maintenance After Age 71 After age 71, you are at a higher risk for certain long-term diseases and infections as well as injuries from falls. Falls are a major cause of broken bones and head injuries in people who are older than age 71. Getting regular preventive care can help to keep you healthy and well. Preventive care includes getting regular testing and making lifestyle changes as recommended by your health care provider. Talk with your health care provider about: Which screenings and tests you should have. A screening is a test that checks for a disease when you have no symptoms. A diet and exercise plan that is right for you. What should I know about screenings and tests to prevent falls? Screening and testing are the best ways to find a health problem early. Early diagnosis and treatment give you the best chance of managing medical conditions that are common after age 71. Certain conditions and lifestyle choices may make you more likely to have a fall. Your health care provider may recommend: Regular vision checks. Poor vision and conditions such as cataracts can make you more likely to have a fall. If you wear glasses, make sure to get your prescription updated if your vision changes. Medicine review. Work with your health care provider to regularly review all of the medicines you are taking, including over-the-counter medicines. Ask your health care provider about any side effects that may make you more likely to have a fall. Tell your health care provider if any medicines that you take make you feel dizzy or sleepy. Strength and balance checks. Your health care provider may recommend certain tests to check your strength and balance while standing, walking, or changing positions. Foot health exam. Foot pain and numbness, as well as not wearing proper footwear, can make you more likely to have a fall. Screenings, including: Osteoporosis screening. Osteoporosis is a condition that causes  the bones to get weaker and break more easily. Blood pressure screening. Blood pressure changes and medicines to control blood pressure can make you feel dizzy. Depression screening. You may be more likely to have a fall if you have a fear of falling, feel depressed, or feel unable to do activities that you used to do. Alcohol use screening. Using too much alcohol can affect your balance and may make you more likely to have a fall. Follow these instructions at home: Lifestyle Do not drink alcohol if: Your health care provider tells you not to drink. If you drink alcohol: Limit how much you have to: 0-1 drink a day for women. 0-2 drinks a day for men. Know how much alcohol is in your drink. In the [place] one drink equals one 12 oz bottle of beer (355 mL), one 5 oz glass of wine (148 mL), or one 1 oz glass of hard liquor (44 mL). Do not use any products that contain nicotine or tobacco. These products include cigarettes, chewing tobacco, and vaping devices, such as e-cigarettes. If you need help quitting, ask your health care provider. Activity  Follow a regular exercise program to stay fit. This will help you maintain your balance. Ask your health care provider what types of exercise are appropriate for you. If you need a cane or walker, use it as recommended by your health care provider. Wear supportive shoes that have nonskid soles. Safety  Remove any tripping hazards, such as rugs, cords, and clutter. Install safety equipment such as grab bars in bathrooms and safety rails on stairs. Keep rooms and walkways   well-lit. General instructions Talk with your health care provider about your risks for falling. Tell your health care provider if: You fall. Be sure to tell your health care provider about all falls, even ones that seem minor. You feel dizzy, tiredness (fatigue), or off-balance. Take over-the-counter and prescription medicines only as told by your health care provider. These include  supplements. Eat a healthy diet and maintain a healthy weight. A healthy diet includes low-fat dairy products, low-fat (lean) meats, and fiber from whole grains, beans, and lots of fruits and vegetables. Stay current with your vaccines. Schedule regular health, dental, and eye exams. Summary Having a healthy lifestyle and getting preventive care can help to protect your health and wellness after age 71. Screening and testing are the best way to find a health problem early and help you avoid having a fall. Early diagnosis and treatment give you the best chance for managing medical conditions that are more common for people who are older than age 71. Falls are a major cause of broken bones and head injuries in people who are older than age 71. Take precautions to prevent a fall at home. Work with your health care provider to learn what changes you can make to improve your health and wellness and to prevent falls. This information is not intended to replace advice given to you by your health care provider. Make sure you discuss any questions you have with your health care provider. Document Revised: 10/25/2020 Document Reviewed: 10/25/2020 Elsevier Patient Education  2024 Else[employer] Inc.  

## 2023-06-05 NOTE — Progress Notes (Signed)
Subjective:    Patient ID: Gregory Malone, male    DOB: 06/03/1951, 72 y.o.   MRN: [id number]  Chief Complaint  Patient presents with   Medical Management of Chronic Issues    SCIATIC NERVE    Pt presents to the office today for CPE and chronic follow up. He has hx of Prostate Cancer 2013.    He has benign pulmonary nodules that are stable, last CT chest scan was 04/2019.   He followed by Nephrologists for CKD stage 3 every 6 months.   He states he is having ED. Reports he is in a relationship for the first time in awhile. Requesting Viagra.  Hypertension This is a chronic problem. The current episode started more than 1 year ago. The problem has been resolved since onset. The problem is controlled. Pertinent negatives include no blurred vision, malaise/fatigue, peripheral edema or shortness of breath. Risk factors for coronary artery disease include dyslipidemia, diabetes mellitus, obesity and sedentary lifestyle. The current treatment provides moderate improvement.  Gastroesophageal Reflux He complains of belching and heartburn. This is a chronic problem. The current episode started more than 1 year ago. The problem occurs occasionally. The symptoms are aggravated by medications. Risk factors include obesity. He has tried a diet change for the symptoms. The treatment provided moderate relief.  Diabetes Diabetes type: prediabetes. Pertinent negatives for diabetes include no blurred vision and no foot paresthesias. Risk factors for coronary artery disease include diabetes mellitus, dyslipidemia, hypertension and sedentary lifestyle. He is following a generally healthy diet. His overall blood glucose range is 90-110 mg/dl. Eye exam is current.  Back Pain This is a chronic problem. The current episode started more than 1 year ago. The problem occurs intermittently. The problem has been waxing and waning since onset. The pain is present in the lumbar spine. The quality of the pain is  described as aching. The pain is at a severity of 2/10. The pain is mild. Risk factors include obesity. He has tried bed rest for the symptoms. The treatment provided mild relief.  Hyperlipidemia This is a chronic problem. The current episode started more than 1 year ago. The problem is controlled. Exacerbating diseases include obesity. Pertinent negatives include no shortness of breath. Current antihyperlipidemic treatment includes statins. The current treatment provides moderate improvement of lipids. Risk factors for coronary artery disease include dyslipidemia, hypertension, male sex and a sedentary lifestyle.      Review of Systems  Constitutional:  Negative for malaise/fatigue.  Eyes:  Negative for blurred vision.  Respiratory:  Negative for shortness of breath.   Gastrointestinal:  Positive for heartburn.  Musculoskeletal:  Positive for back pain.  All other systems reviewed and are negative.  Family History  Problem Relation Age of Onset   Hypertension Mother    Prostate cancer Father 61   Hypertension Father    Hyperlipidemia Father    Uterine cancer Sister    Diabetes Sister    Colon polyps Maternal Aunt    Colon cancer Maternal Aunt    Prostate cancer Paternal Uncle    Prostate cancer Maternal Grandfather 53   Prostate cancer Cousin    Colon cancer Cousin        x 2   Stomach cancer Neg Hx    Rectal cancer Neg Hx    Pancreatic cancer Neg Hx    Esophageal cancer Neg Hx    Social History   Socioeconomic History   Marital status: Divorced    Spouse name: Not on  file   Number of children: 0   Years of education: 16   Highest education level: Bachelor's degree (e.g., BA, AB, BS)  Occupational History   Occupation: retired/[employer] [place]    Comment: Retired Chemical engineer  Tobacco Use   Smoking status: Former    Current packs/day: 0.00    Average packs/day: 1 pack/day for 10.0 years (10.0 ttl pk-yrs)    Types: Cigarettes    Start date: 06/19/1982    Quit date:  06/19/1992    Years since quitting: 30.9   Smokeless tobacco: Never   Tobacco comments:    quit smoking in 1994  Vaping Use   Vaping status: Never Used  Substance and Sexual Activity   Alcohol use: Yes    Alcohol/week: 2.0 standard drinks of alcohol    Types: 2 Cans of beer per week   Drug use: No   Sexual activity: Yes  Other Topics Concern   Not on file  Social History Narrative   Lives alone   Enjoys hiking and metal detector hunting   Changed to a Paleo Diet mid 2022 and no longer has diabetes   Social Drivers of Corporate investment banker Strain: Low Risk  (06/06/2022)   Overall Financial Resource Strain (CARDIA)    Difficulty of Paying Living Expenses: Not hard at all  Food Insecurity: No Food Insecurity (06/06/2022)   Hunger Vital Sign    Worried About Running Out of Food in the Last Year: Never true    Ran Out of Food in the Last Year: Never true  Transportation Needs: No Transportation Needs (06/06/2022)   PRAPARE - Administrator, Civil Service (Medical): No    Lack of Transportation (Non-Medical): No  Physical Activity: Insufficiently Active (06/06/2022)   Exercise Vital Sign    Days of Exercise per Week: 3 days    Minutes of Exercise per Session: 30 min  Stress: No Stress Concern Present (06/06/2022)   [employer] of Occupational Health - Occupational Stress Questionnaire    Feeling of Stress : Not at all  Social Connections: Moderately Integrated (06/06/2022)   Social Connection and Isolation Panel [NHANES]    Frequency of Communication with Friends and Family: More than three times a week    Frequency of Social Gatherings with Friends and Family: More than three times a week    Attends Religious Services: 1 to 4 times per year    Active Member of [employer] or Organizations: Yes    Attends Banker Meetings: 1 to 4 times per year    Marital Status: Divorced       Objective:   Physical Exam Vitals reviewed.  Constitutional:       General: He is not in acute distress.    Appearance: He is well-developed. He is obese.  HENT:     Head: Normocephalic.     Right Ear: Tympanic membrane normal.     Left Ear: Tympanic membrane normal.  Eyes:     General:        Right eye: No discharge.        Left eye: No discharge.     Pupils: Pupils are equal, round, and reactive to light.  Neck:     Thyroid: No thyromegaly.  Cardiovascular:     Rate and Rhythm: Normal rate and regular rhythm.     Heart sounds: Normal heart sounds. No murmur heard. Pulmonary:     Effort: Pulmonary effort is normal. No respiratory distress.  Breath sounds: Normal breath sounds. No wheezing.  Abdominal:     General: Bowel sounds are normal. There is no distension.     Palpations: Abdomen is soft.     Tenderness: There is no abdominal tenderness.  Musculoskeletal:        General: Swelling (left hand swelling and pain with flexion) and tenderness present. Normal range of motion.     Cervical back: Normal range of motion and neck supple.  Skin:    General: Skin is warm and dry.     Findings: No erythema or rash.  Neurological:     Mental Status: He is alert and oriented to person, place, and time.     Cranial Nerves: No cranial nerve deficit.     Deep Tendon Reflexes: Reflexes are normal and symmetric.  Psychiatric:        Behavior: Behavior normal.        Thought Content: Thought content normal.        Judgment: Judgment normal.       BP 112/66   Pulse 95   Temp (!) 97 F (36.1 C) (Temporal)   Ht 5\' 3"  (1.6 m)   Wt 243 lb 3.2 oz (110.3 kg)   BMI 43.08 kg/m      Assessment & Plan:   EDNA SPEARS comes in today with chief complaint of Medical Management of Chronic Issues (SCIATIC NERVE )   Diagnosis and orders addressed:  1. Type 2 diabetes mellitus with obesity (HCC) - CBC with Differential/Platelet - CMP14+EGFR - Hemoglobin A1c - Microalbumin / creatinine urine ratio  2. Morbid obesity (HCC) - CBC with  Differential/Platelet - CMP14+EGFR  3. Hyperlipidemia, unspecified hyperlipidemia type - CBC with Differential/Platelet - CMP14+EGFR - Lipid panel  4. History of prostate cancer - CBC with Differential/Platelet - CMP14+EGFR - PSA, total and free  5. Gastroesophageal reflux disease, unspecified whether esophagitis present - CBC with Differential/Platelet - CMP14+EGFR  6. Essential hypertension - CBC with Differential/Platelet - CMP14+EGFR  7. Stage 3a chronic kidney disease (HCC) - CBC with Differential/Platelet - CMP14+EGFR  8. Degeneration of intervertebral disc of lumbar region with discogenic back pain and lower extremity pain - CBC with Differential/Platelet - CMP14+EGFR  9. Coronary artery disease involving native coronary artery of native heart without angina pectoris - CBC with Differential/Platelet - CMP14+EGFR  10. Annual physical exam (Primary - CBC with Differential/Platelet - CMP14+EGFR - Lipid panel - Hemoglobin A1c - Microalbumin / creatinine urine ratio - PSA, total and free  11. Hand swelling Labs pending X-ray pending May order prednisone or referral to Rheumatologists based on labs - Uric acid - DG Hand Complete Left - Rheumatoid factor   Labs pending Health Maintenance reviewed Diet and exercise encouraged  Follow up plan: 3 months    Jannifer Rodney, FNP

## 2023-06-06 LAB — CBC WITH DIFFERENTIAL/PLATELET
Basophils Absolute: 0 10*3/uL (ref 0.0–0.2)
Basos: 0 %
EOS (ABSOLUTE): 0.1 10*3/uL (ref 0.0–0.4)
Eos: 2 %
Hematocrit: 41.5 % (ref 37.5–51.0)
Hemoglobin: 13.7 g/dL (ref 13.0–17.7)
Immature Grans (Abs): 0 10*3/uL (ref 0.0–0.1)
Immature Granulocytes: 0 %
Lymphocytes Absolute: 0.6 10*3/uL — ABNORMAL LOW (ref 0.7–3.1)
Lymphs: 8 %
MCH: 30 pg (ref 26.6–33.0)
MCHC: 33 g/dL (ref 31.5–35.7)
MCV: 91 fL (ref 79–97)
Monocytes Absolute: 0.7 10*3/uL (ref 0.1–0.9)
Monocytes: 10 %
Neutrophils Absolute: 5.5 10*3/uL (ref 1.4–7.0)
Neutrophils: 80 %
Platelets: 284 10*3/uL (ref 150–450)
RBC: 4.57 x10E6/uL (ref 4.14–5.80)
RDW: 14.1 % (ref 11.6–15.4)
WBC: 7 10*3/uL (ref 3.4–10.8)

## 2023-06-06 LAB — CMP14+EGFR
ALT: 19 [IU]/L (ref 0–44)
AST: 24 [IU]/L (ref 0–40)
Albumin: 3.7 g/dL — ABNORMAL LOW (ref 3.8–4.8)
Alkaline Phosphatase: 85 [IU]/L (ref 44–121)
BUN/Creatinine Ratio: 13 (ref 10–24)
BUN: 17 mg/dL (ref 8–27)
Bilirubin Total: 0.4 mg/dL (ref 0.0–1.2)
CO2: 24 mmol/L (ref 20–29)
Calcium: 9.1 mg/dL (ref 8.6–10.2)
Chloride: 100 mmol/L (ref 96–106)
Creatinine, Ser: 1.32 mg/dL — ABNORMAL HIGH (ref 0.76–1.27)
Globulin, Total: 3.1 g/dL (ref 1.5–4.5)
Glucose: 124 mg/dL — ABNORMAL HIGH (ref 70–99)
Potassium: 4 mmol/L (ref 3.5–5.2)
Sodium: 139 mmol/L (ref 134–144)
Total Protein: 6.8 g/dL (ref 6.0–8.5)
eGFR: 58 mL/min/{1.73_m2} — ABNORMAL LOW (ref >59)

## 2023-06-06 LAB — MICROALBUMIN / CREATININE URINE RATIO
Creatinine, Urine: 91.7 mg/dL
Microalb/Creat Ratio: 5 mg/g{creat} (ref 0–29)
Microalbumin, Urine: 4.3 ug/mL

## 2023-06-06 LAB — LIPID PANEL
Chol/HDL Ratio: 4.4 {ratio} (ref 0.0–5.0)
Cholesterol, Total: 129 mg/dL (ref 100–199)
HDL: 29 mg/dL — ABNORMAL LOW (ref >39)
LDL Chol Calc (NIH): 78 mg/dL (ref 0–99)
Triglycerides: 121 mg/dL (ref 0–149)
VLDL Cholesterol Cal: 22 mg/dL (ref 5–40)

## 2023-06-06 LAB — PSA, TOTAL AND FREE
PSA, Free Pct: 23.3 %
PSA, Free: 0.07 ng/mL
Prostate Specific Ag, Serum: 0.3 ng/mL (ref 0.0–4.0)

## 2023-06-06 LAB — RHEUMATOID FACTOR: Rheumatoid fact SerPl-aCnc: <10 [IU]/mL (ref <14.0)

## 2023-06-06 LAB — URIC ACID: Uric Acid: 9.4 mg/dL — ABNORMAL HIGH (ref 3.8–8.4)

## 2023-06-07 ENCOUNTER — Other Ambulatory Visit: Payer: Self-pay | Admitting: Family

## 2023-06-07 MED ORDER — COLCHICINE 0.6 MG PO TABS: ORAL_TABLET | ORAL | 0 refills | Status: DC

## 2023-06-12 ENCOUNTER — Other Ambulatory Visit: Payer: Self-pay | Admitting: Family

## 2023-06-12 DIAGNOSIS — I2510 Atherosclerotic heart disease of native coronary artery without angina pectoris: Secondary | ICD-10-CM

## 2023-06-12 DIAGNOSIS — I10 Essential (primary) hypertension: Secondary | ICD-10-CM

## 2023-06-15 ENCOUNTER — Other Ambulatory Visit: Payer: PPO

## 2023-07-03 DIAGNOSIS — M4726 Other spondylosis with radiculopathy, lumbar region: Secondary | ICD-10-CM | POA: Diagnosis not present

## 2023-07-03 DIAGNOSIS — M549 Dorsalgia, unspecified: Secondary | ICD-10-CM | POA: Diagnosis not present

## 2023-07-03 DIAGNOSIS — N281 Cyst of kidney, acquired: Secondary | ICD-10-CM | POA: Diagnosis not present

## 2023-07-03 DIAGNOSIS — M5136 Other intervertebral disc degeneration, lumbar region with discogenic back pain only: Secondary | ICD-10-CM | POA: Diagnosis not present

## 2023-07-03 DIAGNOSIS — M79606 Pain in leg, unspecified: Secondary | ICD-10-CM | POA: Diagnosis not present

## 2023-07-16 DIAGNOSIS — M4726 Other spondylosis with radiculopathy, lumbar region: Secondary | ICD-10-CM | POA: Diagnosis not present

## 2023-07-16 DIAGNOSIS — Z6841 Body Mass Index (BMI) 40.0 and over, adult: Secondary | ICD-10-CM | POA: Diagnosis not present

## 2023-07-25 ENCOUNTER — Telehealth: Payer: Self-pay

## 2023-07-25 NOTE — Telephone Encounter (Signed)
 Copied from CRM [id number]. Topic: General - Other >> Jul 25, 2023 11:28 AM [name] wrote: Reason for CRM: Pt called stated his gout is back and request medication. Please call pt [phone].

## 2023-07-26 MED ORDER — COLCHICINE 0.6 MG PO TABS: ORAL_TABLET | ORAL | 1 refills | Status: DC

## 2023-07-26 NOTE — Telephone Encounter (Signed)
 Patient notified. Ls

## 2023-07-26 NOTE — Telephone Encounter (Signed)
Colchicine Prescription sent to pharmacy   

## 2023-07-26 NOTE — Addendum Note (Signed)
 Addended by: Tommas Fragmin A on: 07/26/2023 04:13 PM   Modules accepted: Orders

## 2023-07-27 ENCOUNTER — Encounter: Payer: Self-pay | Admitting: Family

## 2023-07-27 ENCOUNTER — Ambulatory Visit (INDEPENDENT_AMBULATORY_CARE_PROVIDER_SITE_OTHER): Payer: PPO | Admitting: Family

## 2023-07-27 VITALS — BP 118/75 | HR 60 | Temp 98.4°F | Ht 63.0 in | Wt 245.0 lb

## 2023-07-27 DIAGNOSIS — M25562 Pain in left knee: Secondary | ICD-10-CM | POA: Diagnosis not present

## 2023-07-27 DIAGNOSIS — M10042 Idiopathic gout, left hand: Secondary | ICD-10-CM

## 2023-07-27 MED ORDER — KETOROLAC TROMETHAMINE 60 MG/2ML IM SOLN
60.0000 mg | Freq: Once | INTRAMUSCULAR | Status: AC
  Administered 2023-07-27: 60 mg via INTRAMUSCULAR

## 2023-07-27 MED ORDER — METHYLPREDNISOLONE ACETATE 80 MG/ML IJ SUSP
80.0000 mg | Freq: Once | INTRAMUSCULAR | Status: AC
  Administered 2023-07-27: 80 mg via INTRAMUSCULAR

## 2023-07-27 NOTE — Patient Instructions (Signed)
Gout  Gout is a condition that causes painful swelling of the joints. Gout is a type of inflammation of the joints (arthritis). This condition is caused by having too much uric acid in the body. Uric acid is a chemical that forms when the body breaks down substances called purines. Purines are important for building body proteins. When the body has too much uric acid, sharp crystals can form and build up inside the joints. This causes pain and swelling. Gout attacks can happen quickly and may be very painful (acute gout). Over time, the attacks can affect more joints and become more frequent (chronic gout). Gout can also cause uric acid to build up under the skin and inside the kidneys. What are the causes? This condition is caused by too much uric acid in your blood. This can happen because: Your kidneys do not remove enough uric acid from your blood. This is the most common cause. Your body makes too much uric acid. This can happen with some cancers and cancer treatments. It can also occur if your body is breaking down too many red blood cells (hemolytic anemia). You eat too many foods that are high in purines. These foods include organ meats and some seafood. Alcohol, especially beer, is also high in purines. A gout attack may be triggered by trauma or stress. What increases the risk? The following factors may make you more likely to develop this condition: Having a family history of gout. Being male and middle-aged. Being male and having gone through menopause. Taking certain medicines, including aspirin, cyclosporine, diuretics, levodopa, and niacin. Having an organ transplant. Having certain conditions, such as: Being obese. Lead poisoning. Kidney disease. A skin condition called psoriasis. Other factors include: Losing weight too quickly. Being dehydrated. Frequently drinking alcohol, especially beer. Frequently drinking beverages that are sweetened with a type of sugar called  fructose. What are the signs or symptoms? An attack of acute gout happens quickly. It usually occurs in just one joint. The most common place is the big toe. Attacks often start at night. Other joints that may be affected include joints of the feet, ankle, knee, fingers, wrist, or elbow. Symptoms of this condition may include: Severe pain. Warmth. Swelling. Stiffness. Tenderness. The affected joint may be very painful to touch. Shiny, red, or purple skin. Chills and fever. Chronic gout may cause symptoms more frequently. More joints may be involved. You may also have white or yellow lumps (tophi) on your hands or feet or in other areas near your joints. How is this diagnosed? This condition is diagnosed based on your symptoms, your medical history, and a physical exam. You may have tests, such as: Blood tests to measure uric acid levels. Removal of joint fluid with a thin needle (aspiration) to look for uric acid crystals. X-rays to look for joint damage. How is this treated? Treatment for this condition has two phases: treating an acute attack and preventing future attacks. Acute gout treatment may include medicines to reduce pain and swelling, including: NSAIDs, such as ibuprofen. Steroids. These are strong anti-inflammatory medicines that can be taken by mouth (orally) or injected into a joint. Colchicine. This medicine relieves pain and swelling when it is taken soon after an attack. It can be given by mouth or through an IV. Preventive treatment may include: Daily use of smaller doses of NSAIDs or colchicine. Use of a medicine that reduces uric acid levels in your blood, such as allopurinol. Changes to your diet. You may need to see   a dietitian about what to eat and drink to prevent gout. Follow these instructions at home: During a gout attack  If directed, put ice on the affected area. To do this: Put ice in a plastic bag. Place a towel between your skin and the bag. Leave the  ice on for 20 minutes, 2-3 times a day. Remove the ice if your skin turns bright red. This is very important. If you cannot feel pain, heat, or cold, you have a greater risk of damage to the area. Raise (elevate) the affected joint above the level of your heart as often as possible. Rest the joint as much as possible. If the affected joint is in your leg, you may be given crutches to use. Follow instructions from your health care provider about eating or drinking restrictions. Avoiding future gout attacks Follow a low-purine diet as told by your dietitian or health care provider. Avoid foods and drinks that are high in purines, including liver, kidney, anchovies, asparagus, herring, mushrooms, mussels, and beer. Maintain a healthy weight or lose weight if you are overweight. If you want to lose weight, talk with your health care provider. Do not lose weight too quickly. Start or maintain an exercise program as told by your health care provider. Eating and drinking Avoid drinking beverages that contain fructose. Drink enough fluids to keep your urine pale yellow. If you drink alcohol: Limit how much you have to: 0-1 drink a day for women who are not pregnant. 0-2 drinks a day for men. Know how much alcohol is in a drink. In the [place] one drink equals one 12 oz bottle of beer (355 mL), one 5 oz glass of wine (148 mL), or one 1 oz glass of hard liquor (44 mL). General instructions Take over-the-counter and prescription medicines only as told by your health care provider. Ask your health care provider if the medicine prescribed to you requires you to avoid driving or using machinery. Return to your normal activities as told by your health care provider. Ask your health care provider what activities are safe for you. Keep all follow-up visits. This is important. Where to find more information National Institutes of Health: www.niams.nih.gov Contact a health care provider if you have: Another  gout attack. Continuing symptoms of a gout attack after 10 days of treatment. Side effects from your medicines. Chills or a fever. Burning pain when you urinate. Pain in your lower back or abdomen. Get help right away if you: Have severe or uncontrolled pain. Cannot urinate. Summary Gout is painful swelling of the joints caused by having too much uric acid in the body. The most common site for gout to occur is in the big toe, but it can affect other joints in the body. Medicines and dietary changes can help to prevent and treat gout attacks. This information is not intended to replace advice given to you by your health care provider. Make sure you discuss any questions you have with your health care provider. Document Revised: 03/09/2021 Document Reviewed: 03/09/2021 Elsevier Patient Education  2024 [employer] Inc.  

## 2023-07-27 NOTE — Progress Notes (Signed)
 Subjective:    Patient ID: Gregory Malone, male    DOB: Mar 20, 1951, 73 y.o.   MRN: [id number]  Chief Complaint  Patient presents with   Gout    Left hand   Leg Pain    Left leg , states he went hiking this past weekend , thinks he over done it - sore around knee    Pt presents to the office today with left hand swelling and tenderness. He has hx of gout and took colchicine  in December and greatly helped. This was refilled yesterday and took 1.2 mg today. Reports aching pain of 7 out 10. Unable to bend fingers completely because of swelling.   He reports left knee pain. He reports he went hiking over the weekend and was fine, but has noticed soreness of 5-6 out 10 on Monday. Has taken tylenol  with mild relief.   Leg Pain  The incident occurred 3 to 5 days ago. Injury mechanism: started after hiking 1 mile this weekend. The pain is present in the left knee. The pain is at a severity of 5/10. Associated symptoms include a loss of motion. Pertinent negatives include no muscle weakness, numbness or tingling. He reports no foreign bodies present. The symptoms are aggravated by movement and weight bearing. He has tried acetaminophen  for the symptoms. The treatment provided mild relief.      Review of Systems  Neurological:  Negative for tingling and numbness.  All other systems reviewed and are negative.   Social History   Socioeconomic History   Marital status: Divorced    Spouse name: Not on file   Number of children: 0   Years of education: 16   Highest education level: Bachelor's degree (e.g., BA, AB, BS)  Occupational History   Occupation: retired/[employer],[place]    Comment: Retired Chemical Engineer  Tobacco Use   Smoking status: Former    Current packs/day: 0.00    Average packs/day: 1 pack/day for 10.0 years (10.0 ttl pk-yrs)    Types: Cigarettes    Start date: 06/19/1982    Quit date: 06/19/1992    Years since quitting: 31.1   Smokeless tobacco: Never   Tobacco comments:     quit smoking in 1994  Vaping Use   Vaping status: Never Used  Substance and Sexual Activity   Alcohol use: Yes    Alcohol/week: 2.0 standard drinks of alcohol    Types: 2 Cans of beer per week   Drug use: No   Sexual activity: Yes  Other Topics Concern   Not on file  Social History Narrative   Lives alone   Enjoys hiking and metal detector hunting   Changed to a Paleo Diet mid 2022 and no longer has diabetes   Social Drivers of Corporate Investment Banker Strain: Low Risk  (06/06/2022)   Overall Financial Resource Strain (CARDIA)    Difficulty of Paying Living Expenses: Not hard at all  Food Insecurity: No Food Insecurity (06/06/2022)   Hunger Vital Sign    Worried About Running Out of Food in the Last Year: Never true    Ran Out of Food in the Last Year: Never true  Transportation Needs: No Transportation Needs (06/06/2022)   PRAPARE - Administrator, Civil Service (Medical): No    Lack of Transportation (Non-Medical): No  Physical Activity: Insufficiently Active (06/06/2022)   Exercise Vital Sign    Days of Exercise per Week: 3 days    Minutes of Exercise per  Session: 30 min  Stress: No Stress Concern Present (06/06/2022)   [employer] of Occupational Health - Occupational Stress Questionnaire    Feeling of Stress : Not at all  Social Connections: Moderately Integrated (06/06/2022)   Social Connection and Isolation Panel [NHANES]    Frequency of Communication with Friends and Family: More than three times a week    Frequency of Social Gatherings with Friends and Family: More than three times a week    Attends Religious Services: 1 to 4 times per year    Active Member of [employer] or Organizations: Yes    Attends Banker Meetings: 1 to 4 times per year    Marital Status: Divorced   Family History  Problem Relation Age of Onset   Hypertension Mother    Prostate cancer Father 14   Hypertension Father    Hyperlipidemia Father    Uterine  cancer Sister    Diabetes Sister    Colon polyps Maternal Aunt    Colon cancer Maternal Aunt    Prostate cancer Paternal Uncle    Prostate cancer Maternal Grandfather 50   Prostate cancer Cousin    Colon cancer Cousin        x 2   Stomach cancer Neg Hx    Rectal cancer Neg Hx    Pancreatic cancer Neg Hx    Esophageal cancer Neg Hx         Objective:   Physical Exam Vitals reviewed.  Constitutional:      General: He is not in acute distress.    Appearance: He is well-developed.  HENT:     Head: Normocephalic.     Right Ear: Tympanic membrane and external ear normal.     Left Ear: Tympanic membrane and external ear normal.  Eyes:     General:        Right eye: No discharge.        Left eye: No discharge.     Pupils: Pupils are equal, round, and reactive to light.  Neck:     Thyroid : No thyromegaly.  Cardiovascular:     Rate and Rhythm: Normal rate and regular rhythm.     Heart sounds: Normal heart sounds. No murmur heard. Pulmonary:     Effort: Pulmonary effort is normal. No respiratory distress.     Breath sounds: Normal breath sounds. No wheezing.  Abdominal:     General: Bowel sounds are normal. There is no distension.     Palpations: Abdomen is soft.     Tenderness: There is no abdominal tenderness.  Musculoskeletal:        General: Swelling and tenderness present.     Cervical back: Normal range of motion and neck supple.     Right lower leg: Edema (trace) present.     Left lower leg: Edema (2+ in ankle) present.     Comments: Left hand swelling, warmth, and tenderness Pain in left knee with flexion and extension, mild edema present.   Skin:    General: Skin is warm and dry.     Findings: No erythema or rash.  Neurological:     Mental Status: He is alert and oriented to person, place, and time.     Cranial Nerves: No cranial nerve deficit.     Deep Tendon Reflexes: Reflexes are normal and symmetric.  Psychiatric:        Behavior: Behavior normal.         Thought Content: Thought content normal.  Judgment: Judgment normal.       BP 118/75   Pulse 60   Temp 98.4 F (36.9 C)   Ht 5' 3 (1.6 m)   Wt 245 lb (111.1 kg)   SpO2 96%   BMI 43.40 kg/m      Assessment & Plan:  DAMEN WINDSOR comes in today with chief complaint of Gout (Left hand) and Leg Pain (Left leg , states he went hiking this past weekend , thinks he over done it - sore around knee )   Diagnosis and orders addressed:  1. Acute idiopathic gout of left hand (Primary) Rest Force fluids Continue colchicine   Low purine diet - methylPREDNISolone  acetate (DEPO-MEDROL ) injection 80 mg - ketorolac  (TORADOL ) injection 60 mg  2. Acute pain of left knee Rest Ice ROM exercises  - methylPREDNISolone  acetate (DEPO-MEDROL ) injection 80 mg - ketorolac  (TORADOL ) injection 60 mg      Valrie Jia, FNP

## 2023-08-03 ENCOUNTER — Other Ambulatory Visit: Payer: Self-pay | Admitting: Family

## 2023-08-03 DIAGNOSIS — I10 Essential (primary) hypertension: Secondary | ICD-10-CM

## 2023-08-06 ENCOUNTER — Other Ambulatory Visit: Payer: Self-pay | Admitting: Family

## 2023-08-06 DIAGNOSIS — I10 Essential (primary) hypertension: Secondary | ICD-10-CM

## 2023-08-07 ENCOUNTER — Other Ambulatory Visit: Payer: Self-pay | Admitting: Family

## 2023-08-07 DIAGNOSIS — N529 Male erectile dysfunction, unspecified: Secondary | ICD-10-CM

## 2023-08-10 ENCOUNTER — Other Ambulatory Visit: Payer: Self-pay | Admitting: Family

## 2023-08-10 DIAGNOSIS — I10 Essential (primary) hypertension: Secondary | ICD-10-CM

## 2023-08-10 NOTE — Telephone Encounter (Signed)
 Last Fill: 01/26/23  Last OV: 07/27/23 Next OV: 12/04/23  Routing to provider for review/authorization.   Copied from CRM [id number]. Topic: Clinical - Medication Refill >> Aug 10, 2023  4:29 PM Priscille Loveless wrote: Most Recent Primary Care Visit:  Provider: Jannifer Rodney A  Department: [facility]  Visit Type: OFFICE VISIT  Date: 07/27/2023  Medication: lisinopril (ZESTRIL) 40 MG tablet  Has the patient contacted their pharmacy? Yes   Is this the correct pharmacy for this prescription? Yes  This is the patient's preferred pharmacy:  [id number] - [place], [place] - [address] [place] [address] Phone: [phone] Fax: [phone]    Has the prescription been filled recently? Yes  Is the patient out of the medication? Yes  Has the patient been seen for an appointment in the last year OR does the patient have an upcoming appointment? Yes  Can we respond through MyChart? No  Agent: Please be advised that Rx refills may take up to 3 business days. We ask that you follow-up with your pharmacy.

## 2023-08-13 MED ORDER — LISINOPRIL 40 MG PO TABS
40.0000 mg | ORAL_TABLET | Freq: Every day | ORAL | 1 refills | Status: DC
Start: 2023-08-13 — End: 2024-01-28

## 2023-08-29 ENCOUNTER — Other Ambulatory Visit: Payer: Self-pay | Admitting: Family

## 2023-10-08 ENCOUNTER — Other Ambulatory Visit: Payer: Self-pay | Admitting: Family

## 2023-10-23 ENCOUNTER — Encounter: Payer: Self-pay | Admitting: Family

## 2023-11-09 ENCOUNTER — Other Ambulatory Visit: Payer: Self-pay | Admitting: Family

## 2023-12-04 ENCOUNTER — Encounter: Payer: Self-pay | Admitting: Family

## 2023-12-04 ENCOUNTER — Ambulatory Visit: Payer: PPO | Admitting: Family

## 2023-12-04 VITALS — BP 117/79 | HR 82 | Temp 97.7°F | Ht 63.0 in | Wt 250.6 lb

## 2023-12-04 DIAGNOSIS — E1169 Type 2 diabetes mellitus with other specified complication: Secondary | ICD-10-CM | POA: Diagnosis not present

## 2023-12-04 DIAGNOSIS — I129 Hypertensive chronic kidney disease with stage 1 through stage 4 chronic kidney disease, or unspecified chronic kidney disease: Secondary | ICD-10-CM | POA: Diagnosis not present

## 2023-12-04 DIAGNOSIS — E669 Obesity, unspecified: Secondary | ICD-10-CM | POA: Diagnosis not present

## 2023-12-04 DIAGNOSIS — I2510 Atherosclerotic heart disease of native coronary artery without angina pectoris: Secondary | ICD-10-CM | POA: Diagnosis not present

## 2023-12-04 DIAGNOSIS — I10 Essential (primary) hypertension: Secondary | ICD-10-CM

## 2023-12-04 DIAGNOSIS — N189 Chronic kidney disease, unspecified: Secondary | ICD-10-CM | POA: Diagnosis not present

## 2023-12-04 DIAGNOSIS — N1831 Chronic kidney disease, stage 3a: Secondary | ICD-10-CM | POA: Diagnosis not present

## 2023-12-04 DIAGNOSIS — Z6841 Body Mass Index (BMI) 40.0 and over, adult: Secondary | ICD-10-CM | POA: Diagnosis not present

## 2023-12-04 DIAGNOSIS — E785 Hyperlipidemia, unspecified: Secondary | ICD-10-CM

## 2023-12-04 DIAGNOSIS — N200 Calculus of kidney: Secondary | ICD-10-CM | POA: Diagnosis not present

## 2023-12-04 DIAGNOSIS — M25552 Pain in left hip: Secondary | ICD-10-CM | POA: Diagnosis not present

## 2023-12-04 DIAGNOSIS — K219 Gastro-esophageal reflux disease without esophagitis: Secondary | ICD-10-CM | POA: Diagnosis not present

## 2023-12-04 DIAGNOSIS — E1122 Type 2 diabetes mellitus with diabetic chronic kidney disease: Secondary | ICD-10-CM

## 2023-12-04 LAB — BAYER DCA HB A1C WAIVED: HB A1C (BAYER DCA - WAIVED): 5.9 % — ABNORMAL HIGH (ref 4.8–5.6)

## 2023-12-04 MED ORDER — METHYLPREDNISOLONE ACETATE 80 MG/ML IJ SUSP
80.0000 mg | Freq: Once | INTRAMUSCULAR | Status: AC
  Administered 2023-12-04: 80 mg via INTRAMUSCULAR

## 2023-12-04 NOTE — Progress Notes (Signed)
 Subjective:    Patient ID: Gregory Malone, male    DOB: Dec 06, 1950, 73 y.o.   MRN: [id number]  Chief Complaint  Patient presents with   Medical Management of Chronic Issues    LEFT SIDE PAIN IN MUSCLE WHEN HER GETS UP   Hearing Problem   Pt presents to the office today for chronic follow up. He has hx of Prostate Cancer 2013.    He has benign pulmonary nodules that are stable, last CT chest scan was 04/2019.   He followed by Nephrologists for CKD annually.   He states he is having ED. Reports he is in a relationship for the first time in awhile. Requesting Viagra .  Hypertension This is a chronic problem. The current episode started more than 1 year ago. The problem has been resolved since onset. The problem is controlled. Pertinent negatives include no blurred vision, malaise/fatigue, peripheral edema or shortness of breath. Risk factors for coronary artery disease include dyslipidemia, diabetes mellitus, obesity and sedentary lifestyle. The current treatment provides moderate improvement.  Diabetes He presents for his follow-up diabetic visit. Diabetes type: prediabetes. Pertinent negatives for diabetes include no blurred vision and no foot paresthesias. Risk factors for coronary artery disease include diabetes mellitus, dyslipidemia, hypertension and sedentary lifestyle. He is following a generally healthy diet. His overall blood glucose range is 110-130 mg/dl. Eye exam is current.  Back Pain This is a chronic problem. The current episode started more than 1 year ago. The problem occurs intermittently. The problem has been waxing and waning since onset. The pain is present in the lumbar spine. The quality of the pain is described as aching. The pain is at a severity of 1/10. The pain is mild. The symptoms are aggravated by standing. Pertinent negatives include no numbness or tingling. Risk factors include obesity. He has tried bed rest for the symptoms. The treatment provided mild  relief.  Hyperlipidemia This is a chronic problem. The current episode started more than 1 year ago. The problem is controlled. Exacerbating diseases include obesity. Pertinent negatives include no shortness of breath. Current antihyperlipidemic treatment includes statins. The current treatment provides moderate improvement of lipids. Risk factors for coronary artery disease include dyslipidemia, hypertension, male sex and a sedentary lifestyle.  Hip Pain  The incident occurred more than 1 week ago. There was no injury mechanism. The pain is present in the left hip. The quality of the pain is described as aching. The pain is at a severity of 4/10. The pain is moderate. The pain has been Intermittent since onset. Pertinent negatives include no muscle weakness, numbness or tingling. He reports no foreign bodies present. The symptoms are aggravated by movement. He has tried acetaminophen  for the symptoms. The treatment provided mild relief.      Review of Systems  Constitutional:  Negative for malaise/fatigue.  Eyes:  Negative for blurred vision.  Respiratory:  Negative for shortness of breath.   Musculoskeletal:  Positive for back pain.  Neurological:  Negative for tingling and numbness.  All other systems reviewed and are negative.  Family History  Problem Relation Age of Onset   Hypertension Mother    Prostate cancer Father 5   Hypertension Father    Hyperlipidemia Father    Uterine cancer Sister    Diabetes Sister    Colon polyps Maternal Aunt    Colon cancer Maternal Aunt    Prostate cancer Paternal Uncle    Prostate cancer Maternal Grandfather 26   Prostate cancer Cousin  Colon cancer Cousin        x 2   Stomach cancer Neg Hx    Rectal cancer Neg Hx    Pancreatic cancer Neg Hx    Esophageal cancer Neg Hx    Social History   Socioeconomic History   Marital status: Divorced    Spouse name: Not on file   Number of children: 0   Years of education: 16   Highest education  level: Bachelor's degree (e.g., BA, AB, BS)  Occupational History   Occupation: retired/[employer],[place]    Comment: Retired Chemical engineer  Tobacco Use   Smoking status: Former    Current packs/day: 0.00    Average packs/day: 1 pack/day for 10.0 years (10.0 ttl pk-yrs)    Types: Cigarettes    Start date: 06/19/1982    Quit date: 06/19/1992    Years since quitting: 31.4   Smokeless tobacco: Never   Tobacco comments:    quit smoking in 1994  Vaping Use   Vaping status: Never Used  Substance and Sexual Activity   Alcohol use: Yes    Alcohol/week: 2.0 standard drinks of alcohol    Types: 2 Cans of beer per week   Drug use: No   Sexual activity: Yes  Other Topics Concern   Not on file  Social History Narrative   Lives alone   Enjoys hiking and metal detector hunting   Changed to a Paleo Diet mid 2022 and no longer has diabetes   Social Drivers of Corporate investment banker Strain: Low Risk  (06/06/2022)   Overall Financial Resource Strain (CARDIA)    Difficulty of Paying Living Expenses: Not hard at all  Food Insecurity: No Food Insecurity (06/06/2022)   Hunger Vital Sign    Worried About Running Out of Food in the Last Year: Never true    Ran Out of Food in the Last Year: Never true  Transportation Needs: No Transportation Needs (06/06/2022)   PRAPARE - Administrator, Civil Service (Medical): No    Lack of Transportation (Non-Medical): No  Physical Activity: Insufficiently Active (06/06/2022)   Exercise Vital Sign    Days of Exercise per Week: 3 days    Minutes of Exercise per Session: 30 min  Stress: No Stress Concern Present (06/06/2022)   [employer] of Occupational Health - Occupational Stress Questionnaire    Feeling of Stress : Not at all  Social Connections: Moderately Integrated (06/06/2022)   Social Connection and Isolation Panel    Frequency of Communication with Friends and Family: More than three times a week    Frequency of Social Gatherings  with Friends and Family: More than three times a week    Attends Religious Services: 1 to 4 times per year    Active Member of [employer] or Organizations: Yes    Attends Banker Meetings: 1 to 4 times per year    Marital Status: Divorced       Objective:   Physical Exam Vitals reviewed.  Constitutional:      General: He is not in acute distress.    Appearance: He is well-developed. He is obese.  HENT:     Head: Normocephalic.     Right Ear: Tympanic membrane normal.     Left Ear: Tympanic membrane normal.   Eyes:     General:        Right eye: No discharge.        Left eye: No discharge.  Pupils: Pupils are equal, round, and reactive to light.   Neck:     Thyroid : No thyromegaly.   Cardiovascular:     Rate and Rhythm: Normal rate and regular rhythm.     Heart sounds: Normal heart sounds. No murmur heard. Pulmonary:     Effort: Pulmonary effort is normal. No respiratory distress.     Breath sounds: Normal breath sounds. No wheezing.  Abdominal:     General: Bowel sounds are normal. There is no distension.     Palpations: Abdomen is soft.     Tenderness: There is no abdominal tenderness.   Musculoskeletal:        General: Tenderness present. No swelling. Normal range of motion.     Cervical back: Normal range of motion and neck supple.     Comments: Pain in left buttocks with flexion    Skin:    General: Skin is warm and dry.     Findings: No erythema or rash.   Neurological:     Mental Status: He is alert and oriented to person, place, and time.     Cranial Nerves: No cranial nerve deficit.     Deep Tendon Reflexes: Reflexes are normal and symmetric.   Psychiatric:        Behavior: Behavior normal.        Thought Content: Thought content normal.        Judgment: Judgment normal.       BP 117/79   Pulse 82   Temp 97.7 F (36.5 C) (Temporal)   Ht 5' 3 (1.6 m)   Wt 250 lb 9.6 oz (113.7 kg)   BMI 44.39 kg/m      Assessment & Plan:    GAMALIEL CHARNEY comes in today with chief complaint of Medical Management of Chronic Issues (LEFT SIDE PAIN IN MUSCLE WHEN HER GETS UP) and Hearing Problem   Diagnosis and orders addressed:  1. Essential hypertension  2. Hyperlipidemia, unspecified hyperlipidemia type  3. Morbid obesity (HCC)  4. Type 2 diabetes mellitus with obesity (HCC) (Primary) - Bayer DCA Hb A1c Waived  5. Gastroesophageal reflux disease, unspecified whether esophagitis present  6. Stage 3a chronic kidney disease (HCC)  7. Coronary artery disease involving native coronary artery of native heart without angina pectoris  8. Type 2 diabetes mellitus with stage 3 chronic kidney disease, without long-term current use of insulin, unspecified whether stage 3a or 3b CKD (HCC)  9. Left hip pain - methylPREDNISolone  acetate (DEPO-MEDROL ) injection 80 mg    Labs pending Continue current medications  Will get labs today from nephrologists  Health Maintenance reviewed Diet and exercise encouraged  Follow up plan: 6 months    Tommas Fragmin, FNP

## 2023-12-04 NOTE — Patient Instructions (Signed)
 Hip Pain The hip is the joint between the upper legs and the lower pelvis. The bones, cartilage, tendons, and muscles of your hip joint support your body and allow you to move around. Hip pain can range from a minor ache to severe pain in one or both of your hips. The pain may be felt on the inside of the hip joint near the groin, or on the outside near the buttocks and upper thigh. You may also have swelling or stiffness in your hip area. Follow these instructions at home: Managing pain, stiffness, and swelling     If told, put ice on the painful area. Put ice in a plastic bag. Place a towel between your skin and the bag. Leave the ice on for 20 minutes, 2-3 times a day. If told, apply heat to the affected area as often as told by your health care provider. Use the heat source that your provider recommends, such as a moist heat pack or a heating pad. Place a towel between your skin and the heat source. Leave the heat on for 20-30 minutes. If your skin turns bright red, remove the ice or heat right away to prevent skin damage. The risk of damage is higher if you cannot feel pain, heat, or cold. Activity Do exercises as told by your provider. Avoid activities that cause pain. General instructions  Take over-the-counter and prescription medicines only as told by your provider. Keep a journal of your symptoms. Write down: How often you have hip pain. The location of your pain. What the pain feels like. What makes the pain worse. Sleep with a pillow between your legs on your most comfortable side. Keep all follow-up visits. Your provider will monitor your pain and activity. Contact a health care provider if: You cannot put weight on your leg. Your pain or swelling gets worse after a week. It gets harder to walk. You have a fever. Get help right away if: You fall. You have a sudden increase in pain and swelling in your hip. Your hip is red or swollen or very tender to touch. This  information is not intended to replace advice given to you by your health care provider. Make sure you discuss any questions you have with your health care provider. Document Revised: 02/07/2022 Document Reviewed: 02/07/2022 Elsevier Patient Education  2024 [employer].

## 2023-12-06 ENCOUNTER — Telehealth: Payer: Self-pay

## 2023-12-06 NOTE — Telephone Encounter (Signed)
 Copied from CRM [id number]. Topic: Clinical - Request for Lab/Test Order >> Dec 06, 2023  4:46 PM Turkey B wrote: Reason for CRM: pt called in trying to get results of A1c labs. Please cb when result notes are in

## 2023-12-07 NOTE — Telephone Encounter (Signed)
 Called and spoke with patient and gave him Hgb A1c results. Patient verbalized understanding

## 2023-12-07 NOTE — Telephone Encounter (Signed)
 Hgba1c was 5.9% which is good

## 2023-12-10 ENCOUNTER — Ambulatory Visit: Payer: Self-pay | Admitting: Family

## 2023-12-12 DIAGNOSIS — E1122 Type 2 diabetes mellitus with diabetic chronic kidney disease: Secondary | ICD-10-CM | POA: Diagnosis not present

## 2023-12-12 DIAGNOSIS — N200 Calculus of kidney: Secondary | ICD-10-CM | POA: Diagnosis not present

## 2023-12-12 DIAGNOSIS — I129 Hypertensive chronic kidney disease with stage 1 through stage 4 chronic kidney disease, or unspecified chronic kidney disease: Secondary | ICD-10-CM | POA: Diagnosis not present

## 2023-12-12 DIAGNOSIS — N1831 Chronic kidney disease, stage 3a: Secondary | ICD-10-CM | POA: Diagnosis not present

## 2023-12-24 ENCOUNTER — Telehealth: Payer: Self-pay | Admitting: Family

## 2023-12-24 NOTE — Telephone Encounter (Unsigned)
 Copied from CRM [id number]. Topic: Medical Record Request - Payor/Billing Request >> Dec 24, 2023  1:28 PM Gregory Malone wrote: Reason for CRM: PT called in about being billed from [facility] from the labs that were taken out on  12/04/2023.    Pt received a bill for over $300 and is concerned as to why. Would like for someone to call him regarding this matter, would like it to be looked into .       Pt callback [phone], does not have voicemail.

## 2023-12-25 NOTE — Telephone Encounter (Signed)
 Spoke with patient. He contacted [facility] and has resolved this issue

## 2023-12-31 ENCOUNTER — Other Ambulatory Visit: Payer: Self-pay | Admitting: Family

## 2024-01-01 ENCOUNTER — Other Ambulatory Visit: Payer: Self-pay | Admitting: Family

## 2024-01-01 DIAGNOSIS — E785 Hyperlipidemia, unspecified: Secondary | ICD-10-CM

## 2024-01-19 ENCOUNTER — Other Ambulatory Visit: Payer: Self-pay | Admitting: Family

## 2024-01-27 ENCOUNTER — Other Ambulatory Visit: Payer: Self-pay | Admitting: Family

## 2024-01-27 DIAGNOSIS — I10 Essential (primary) hypertension: Secondary | ICD-10-CM

## 2024-03-03 ENCOUNTER — Ambulatory Visit: Payer: Self-pay | Admitting: *Deleted

## 2024-03-03 NOTE — Telephone Encounter (Signed)
 Appt made.

## 2024-03-03 NOTE — Telephone Encounter (Signed)
 Copied from CRM [id number]. Topic: Clinical - Red Word Triage >> Mar 03, 2024  2:46 PM Larissa S wrote: [facility] that prompted transfer to Nurse Triage: rt hip pain Reason for Disposition  [1] MODERATE pain (e.g., interferes with normal activities, limping) AND [2] present > 3 days  Answer Assessment - Initial Assessment Questions 1. LOCATION and RADIATION: Where is the pain located? Does the pain spread (shoot) anywhere else?     I'm having right hip pain.   I've had this several months now.   I twisted it or something and it's hurting.   It's going down my leg.   I got a pain shot for it.  It worked really well.   Over the last day or two I must have twisted my body and aggravated it.  2. QUALITY: What does the pain feel like?  (e.g., sharp, dull, aching, burning)     It's a sharp pain I saw a neurosurgeon and I got a cortisone shot. L  It took 2 months to get in.    3. SEVERITY: How bad is the pain? What does it keep you from doing?   (Scale 1-10; or mild, moderate, severe)     I would like to get another shot.  4. ONSET: When did the pain start? Does it come and go, or is it there all the time?    The last day or two I twisted my body wrong and it's been over the last day or so. 5. WORK OR EXERCISE: Has there been any recent work or exercise that involved this part of the body?      I twisted my body and got it aggravated.   6. CAUSE: What do you think is causing the hip pain?      I have a bad hip 7. AGGRAVATING FACTORS: What makes the hip pain worse? (e.g., walking, climbing stairs, running)     I'm ok now   I can walk. 8. OTHER SYMPTOMS: Do you have any other symptoms? (e.g., back pain, pain shooting down leg,  fever, rash)     Not this time not down my leg.  Protocols used: Hip Pain-A-AH FYI Only or Action Required?: FYI only for provider.  Patient was last seen in primary care on 12/04/2023 by Lavell Bari LABOR, FNP.  Called Nurse Triage reporting Hip  Pain.Chronic hip pain on right side but twisted or something yesterday and it's flared up over the last day or two.  Symptoms began yesterday.  Interventions attempted: OTC medications: Tylenol .  Symptoms are: gradually worsening.  Triage Disposition: See PCP When Office is Open (Within 3 Days)  Patient/caregiver understands and will follow disposition?: Yes

## 2024-03-04 ENCOUNTER — Ambulatory Visit: Admitting: Family

## 2024-03-04 ENCOUNTER — Encounter: Payer: Self-pay | Admitting: Family

## 2024-03-04 VITALS — BP 129/81 | HR 78 | Temp 97.7°F | Ht 63.0 in | Wt 254.4 lb

## 2024-03-04 DIAGNOSIS — Z6841 Body Mass Index (BMI) 40.0 and over, adult: Secondary | ICD-10-CM | POA: Diagnosis not present

## 2024-03-04 DIAGNOSIS — M25551 Pain in right hip: Secondary | ICD-10-CM | POA: Diagnosis not present

## 2024-03-04 DIAGNOSIS — M7071 Other bursitis of hip, right hip: Secondary | ICD-10-CM | POA: Diagnosis not present

## 2024-03-04 MED ORDER — METHYLPREDNISOLONE ACETATE 80 MG/ML IJ SUSP
80.0000 mg | Freq: Once | INTRAMUSCULAR | Status: AC
  Administered 2024-03-04: 80 mg via INTRAMUSCULAR

## 2024-03-04 MED ORDER — KETOROLAC TROMETHAMINE 60 MG/2ML IM SOLN
60.0000 mg | Freq: Once | INTRAMUSCULAR | Status: AC
  Administered 2024-03-04: 60 mg via INTRAMUSCULAR

## 2024-03-04 NOTE — Progress Notes (Signed)
 Subjective:    Patient ID: Gregory Malone, male    DOB: [birth year], 73 y.o.   MRN: [id number]  Chief Complaint  Patient presents with   Hip Pain    RIGHT HIP   PT presents to the office today with hip pain that started several days ago.  Hip Pain  The incident occurred 3 to 5 days ago. The injury mechanism was a twisting injury. The pain is present in the right hip. The quality of the pain is described as aching. The pain is at a severity of 6/10. The pain is moderate. The pain has been Intermittent since onset. Pertinent negatives include no numbness or tingling. He reports no foreign bodies present. The symptoms are aggravated by movement. He has tried acetaminophen  for the symptoms. The treatment provided mild relief.      Review of Systems  Neurological:  Negative for tingling and numbness.  All other systems reviewed and are negative.   Social History   Socioeconomic History   Marital status: Divorced    Spouse name: Not on file   Number of children: 0   Years of education: 16   Highest education level: Bachelor's degree (e.g., BA, AB, BS)  Occupational History   Occupation: retired/[employer]    Comment: Retired Chemical engineer  Tobacco Use   Smoking status: Former    Current packs/day: 0.00    Average packs/day: 1 pack/day for 10.0 years (10.0 ttl pk-yrs)    Types: Cigarettes    Start date: 06/19/1982    Quit date: 06/19/1992    Years since quitting: 31.7   Smokeless tobacco: Never   Tobacco comments:    quit smoking in 1994  Vaping Use   Vaping status: Never Used  Substance and Sexual Activity   Alcohol use: Yes    Alcohol/week: 2.0 standard drinks of alcohol    Types: 2 Cans of beer per week   Drug use: No   Sexual activity: Yes  Other Topics Concern   Not on file  Social History Narrative   Lives alone   Enjoys hiking and metal detector hunting   Changed to a Paleo Diet mid 2022 and no longer has diabetes   Social Drivers of Manufacturing engineer Strain: Low Risk  (06/06/2022)   Overall Financial Resource Strain (CARDIA)    Difficulty of Paying Living Expenses: Not hard at all  Food Insecurity: No Food Insecurity (06/06/2022)   Hunger Vital Sign    Worried About Running Out of Food in the Last Year: Never true    Ran Out of Food in the Last Year: Never true  Transportation Needs: No Transportation Needs (06/06/2022)   PRAPARE - Administrator, Civil Service (Medical): No    Lack of Transportation (Non-Medical): No  Physical Activity: Insufficiently Active (06/06/2022)   Exercise Vital Sign    Days of Exercise per Week: 3 days    Minutes of Exercise per Session: 30 min  Stress: No Stress Concern Present (06/06/2022)   [employer] of Occupational Health - Occupational Stress Questionnaire    Feeling of Stress : Not at all  Social Connections: Moderately Integrated (06/06/2022)   Social Connection and Isolation Panel    Frequency of Communication with Friends and Family: More than three times a week    Frequency of Social Gatherings with Friends and Family: More than three times a week    Attends Religious Services: 1 to 4 times per year  Active Member of Clubs or Organizations: Yes    Attends Banker Meetings: 1 to 4 times per year    Marital Status: Divorced   Family History  Problem Relation Age of Onset   Hypertension Mother    Prostate cancer Father 23   Hypertension Father    Hyperlipidemia Father    Uterine cancer Sister    Diabetes Sister    Colon polyps Maternal Aunt    Colon cancer Maternal Aunt    Prostate cancer Paternal Uncle    Prostate cancer Maternal Grandfather 50   Prostate cancer Cousin    Colon cancer Cousin        x 2   Stomach cancer Neg Hx    Rectal cancer Neg Hx    Pancreatic cancer Neg Hx    Esophageal cancer Neg Hx         Objective:   Physical Exam Vitals reviewed.  Constitutional:      General: He is not in acute distress.     Appearance: He is well-developed.  HENT:     Head: Normocephalic.     Right Ear: Tympanic membrane normal.     Left Ear: Tympanic membrane normal.  Eyes:     General:        Right eye: No discharge.        Left eye: No discharge.     Pupils: Pupils are equal, round, and reactive to light.  Neck:     Thyroid : No thyromegaly.  Cardiovascular:     Rate and Rhythm: Normal rate and regular rhythm.     Heart sounds: Normal heart sounds. No murmur heard. Pulmonary:     Effort: Pulmonary effort is normal. No respiratory distress.     Breath sounds: Normal breath sounds. No wheezing.  Abdominal:     General: Bowel sounds are normal. There is no distension.     Palpations: Abdomen is soft.     Tenderness: There is no abdominal tenderness.  Musculoskeletal:        General: No tenderness.     Cervical back: Normal range of motion and neck supple.     Comments: Pain in lumbar with flexion and extension, pain in lateral hip with palpation and external rotation  Skin:    General: Skin is warm and dry.     Findings: No erythema or rash.  Neurological:     Mental Status: He is alert and oriented to person, place, and time.     Cranial Nerves: No cranial nerve deficit.     Deep Tendon Reflexes: Reflexes are normal and symmetric.  Psychiatric:        Behavior: Behavior normal.        Thought Content: Thought content normal.        Judgment: Judgment normal.       BP 129/81   Pulse 78   Temp 97.7 F (36.5 C)   Ht 5' 3 (1.6 m)   Wt 254 lb 6.4 oz (115.4 kg)   SpO2 95%   BMI 45.06 kg/m      Assessment & Plan:  THARON BOMAR comes in today with chief complaint of Hip Pain (RIGHT HIP)   Diagnosis and orders addressed:  1. Right hip pain (Primary) - methylPREDNISolone  acetate (DEPO-MEDROL ) injection 80 mg - ketorolac  (TORADOL ) injection 60 mg  2. Morbid obesity (HCC)  3. BMI 45.0-49.9, adult (HCC)  4. Bursitis of right hip, unspecified bursa    Rest Ice ROM  exercises  encouraged Tylenol  as needed Follow up if symptoms worsen or do not improve    Bari Learn, FNP

## 2024-03-04 NOTE — Patient Instructions (Signed)
 Hip Pain The hip is the joint between the upper legs and the lower pelvis. The bones, cartilage, tendons, and muscles of your hip joint support your body and allow you to move around. Hip pain can range from a minor ache to severe pain in one or both of your hips. The pain may be felt on the inside of the hip joint near the groin, or on the outside near the buttocks and upper thigh. You may also have swelling or stiffness in your hip area. Follow these instructions at home: Managing pain, stiffness, and swelling     If told, put ice on the painful area. Put ice in a plastic bag. Place a towel between your skin and the bag. Leave the ice on for 20 minutes, 2-3 times a day. If told, apply heat to the affected area as often as told by your health care provider. Use the heat source that your provider recommends, such as a moist heat pack or a heating pad. Place a towel between your skin and the heat source. Leave the heat on for 20-30 minutes. If your skin turns bright red, remove the ice or heat right away to prevent skin damage. The risk of damage is higher if you cannot feel pain, heat, or cold. Activity Do exercises as told by your provider. Avoid activities that cause pain. General instructions  Take over-the-counter and prescription medicines only as told by your provider. Keep a journal of your symptoms. Write down: How often you have hip pain. The location of your pain. What the pain feels like. What makes the pain worse. Sleep with a pillow between your legs on your most comfortable side. Keep all follow-up visits. Your provider will monitor your pain and activity. Contact a health care provider if: You cannot put weight on your leg. Your pain or swelling gets worse after a week. It gets harder to walk. You have a fever. Get help right away if: You fall. You have a sudden increase in pain and swelling in your hip. Your hip is red or swollen or very tender to touch. This  information is not intended to replace advice given to you by your health care provider. Make sure you discuss any questions you have with your health care provider. Document Revised: 02/07/2022 Document Reviewed: 02/07/2022 Elsevier Patient Education  2024 [employer].

## 2024-03-11 ENCOUNTER — Ambulatory Visit (INDEPENDENT_AMBULATORY_CARE_PROVIDER_SITE_OTHER)

## 2024-03-11 VITALS — BP 129/81 | HR 78 | Ht 63.0 in | Wt 254.0 lb

## 2024-03-11 DIAGNOSIS — Z0000 Encounter for general adult medical examination without abnormal findings: Secondary | ICD-10-CM | POA: Diagnosis not present

## 2024-03-11 NOTE — Progress Notes (Signed)
 Subjective:   Gregory Malone is a 73 y.o. who presents for a Medicare Wellness preventive visit.  As a reminder, Annual Wellness Visits don't include a physical exam, and some assessments may be limited, especially if this visit is performed virtually. We may recommend an in-person follow-up visit with your provider if needed.  Visit Complete: Virtual I connected with  Gregory Malone Rosa on 03/11/24 by a audio enabled telemedicine application and verified that I am speaking with the correct person using two identifiers.  Patient Location: Home  Provider Location: Home Office  I discussed the limitations of evaluation and management by telemedicine. The patient expressed understanding and agreed to proceed.  Vital Signs: Because this visit was a virtual/telehealth visit, some criteria may be missing or patient reported. Any vitals not documented were not able to be obtained and vitals that have been documented are patient reported.  VideoDeclined- This patient declined Librarian, academic. Therefore the visit was completed with audio only.  Persons Participating in Visit: Patient.  AWV Questionnaire: No: Patient Medicare AWV questionnaire was not completed prior to this visit.  Cardiac Risk Factors include: diabetes mellitus;dyslipidemia;hypertension;obesity (BMI >30kg/m2);smoking/ tobacco exposure;male gender;advanced age (>10men, >4 women)     Objective:    Today's Vitals   03/11/24 1122  BP: 129/81  Pulse: 78  Weight: 254 lb (115.2 kg)  Height: 5' 3 (1.6 m)   Body mass index is 44.99 kg/m.     03/11/2024   11:26 AM 06/06/2022   10:39 AM 06/03/2021   10:50 AM 06/02/2020    9:41 AM 10/06/2019    9:37 PM 08/11/2019    4:37 PM 06/02/2019    9:42 AM  Advanced Directives  Does Patient Have a Medical Advance Directive? Yes Yes Yes No No Yes Yes  Type of Estate agent of [place];Living will Healthcare Power of  [place];Living will Healthcare Power of [place];Living will    Living will  Does patient want to make changes to medical advance directive?  No - Patient declined     No - Patient declined  Copy of Healthcare Power of Attorney in Chart?  Yes - validated most recent copy scanned in chart (See row information) Yes - validated most recent copy scanned in chart (See row information)      Would patient like information on creating a medical advance directive?    No - Patient declined No - Patient declined  No - Patient declined    Current Medications (verified) Outpatient Encounter Medications as of 03/11/2024  Medication Sig   acetaminophen  (TYLENOL ) 500 MG tablet Take 500 mg by mouth every 6 (six) hours as needed.   amLODipine  (NORVASC ) 10 MG tablet TAKE 1 TABLET BY MOUTH EVERY DAY   atorvastatin  (LIPITOR) 80 MG tablet TAKE 1 TABLET BY MOUTH EVERY DAY   blood glucose meter kit and supplies Dispense based on patient and insurance preference. Use up to four times daily as directed. (FOR ICD-10 E10.9, E11.9).   colchicine  0.6 MG tablet TAKE 2 TABS IMMEDIATELY, THEN 1 TAB TWICE PER DAY FOR THE DURATION OF THE FLARE - MAX OF 7 DAYS   hydrochlorothiazide  (HYDRODIURIL ) 25 MG tablet TAKE 1 TABLET (25 MG TOTAL) BY MOUTH DAILY.   lisinopril  (ZESTRIL ) 40 MG tablet TAKE 1 TABLET BY MOUTH EVERY DAY   metoprolol  tartrate (LOPRESSOR ) 25 MG tablet TAKE 1 TABLET BY MOUTH TWICE A DAY   Multiple Vitamin (MULTIVITAMIN ADULT PO) Take by mouth.   ONETOUCH VERIO test  strip USE TO CHECK SUGAR UP TO 4 TIMES DAILY ICD-10 E10.9, E11.9   potassium chloride  SA (KLOR-CON  M) 20 MEQ tablet TAKE 1 TABLET BY MOUTH EVERY DAY   sildenafil  (VIAGRA ) 100 MG tablet TAKE 0.5-1 TABLETS BY MOUTH DAILY AS NEEDED FOR ERECTILE DYSFUNCTION.   No facility-administered encounter medications on file as of 03/11/2024.    Allergies (verified) Nsaids   History: Past Medical History:  Diagnosis Date   Arthritis    Asthma    Bowel  obstruction (HCC)    CAD (coronary artery disease)    Chronic kidney disease    nephrolithiasis  couple episodes 10 years ago   Diabetes mellitus (HCC) 12/27/2015   GERD (gastroesophageal reflux disease)    Hx of colonic polyp    Hypercholesterolemia    Hypertension    Prostate cancer (HCC) 03/26/12 bx   ,gleason=3+3=6,PSA=4.22,volume=34cc   Pulmonary nodule    Radiation 06/27/2012-08/20/2012   prostate 7600 cGy 38 sessions   Past Surgical History:  Procedure Laterality Date   COLONOSCOPY  11/28/2017   Dr.Danis   POLYPECTOMY     PROSTATE BIOPSY  03/26/2012   adenocarcinoma   TONSILLECTOMY     UPPER GASTROINTESTINAL ENDOSCOPY     Family History  Problem Relation Age of Onset   Hypertension Mother    Prostate cancer Father 8   Hypertension Father    Hyperlipidemia Father    Uterine cancer Sister    Diabetes Sister    Colon polyps Maternal Aunt    Colon cancer Maternal Aunt    Prostate cancer Paternal Uncle    Prostate cancer Maternal Grandfather 50   Prostate cancer Cousin    Colon cancer Cousin        x 2   Stomach cancer Neg Hx    Rectal cancer Neg Hx    Pancreatic cancer Neg Hx    Esophageal cancer Neg Hx    Social History   Socioeconomic History   Marital status: Divorced    Spouse name: Not on file   Number of children: 0   Years of education: 16   Highest education level: Bachelor's degree (e.g., BA, AB, BS)  Occupational History   Occupation: retired/[employer],[place]    Comment: Retired Chemical engineer  Tobacco Use   Smoking status: Former    Current packs/day: 0.00    Average packs/day: 1 pack/day for 10.0 years (10.0 ttl pk-yrs)    Types: Cigarettes    Start date: 06/19/1982    Quit date: 06/19/1992    Years since quitting: 31.7   Smokeless tobacco: Never   Tobacco comments:    quit smoking in 1994  Vaping Use   Vaping status: Never Used  Substance and Sexual Activity   Alcohol use: Yes    Alcohol/week: 2.0 standard drinks of alcohol    Types: 2  Cans of beer per week   Drug use: No   Sexual activity: Yes  Other Topics Concern   Not on file  Social History Narrative   Lives alone   Enjoys hiking and metal detector hunting   Changed to a Paleo Diet mid 2022 and no longer has diabetes   Social Drivers of Corporate investment banker Strain: Low Risk  (03/11/2024)   Overall Financial Resource Strain (CARDIA)    Difficulty of Paying Living Expenses: Not hard at all  Food Insecurity: No Food Insecurity (03/11/2024)   Hunger Vital Sign    Worried About Running Out of Food in the Last Year:  Never true    Ran Out of Food in the Last Year: Never true  Transportation Needs: No Transportation Needs (03/11/2024)   PRAPARE - Administrator, Civil Service (Medical): No    Lack of Transportation (Non-Medical): No  Physical Activity: Sufficiently Active (03/11/2024)   Exercise Vital Sign    Days of Exercise per Week: 5 days    Minutes of Exercise per Session: 40 min  Stress: No Stress Concern Present (03/11/2024)   [employer] of Occupational Health - Occupational Stress Questionnaire    Feeling of Stress: Not at all  Social Connections: Moderately Integrated (03/11/2024)   Social Connection and Isolation Panel    Frequency of Communication with Friends and Family: More than three times a week    Frequency of Social Gatherings with Friends and Family: More than three times a week    Attends Religious Services: 1 to 4 times per year    Active Member of [employer] or Organizations: Yes    Attends Banker Meetings: 1 to 4 times per year    Marital Status: Divorced    Tobacco Counseling Counseling given: Yes Tobacco comments: quit smoking in 1994    Clinical Intake:  Pre-visit preparation completed: Yes  Pain : No/denies pain     BMI - recorded: 44.99 Nutritional Status: BMI > 30  Obese Nutritional Risks: None Diabetes: No  Lab Results  Component Value Date   HGBA1C 5.9 (H) 12/04/2023   HGBA1C 5.8  (H) 06/05/2023   HGBA1C 6.5 (H) 11/20/2022     How often do you need to have someone help you when you read instructions, pamphlets, or other written materials from your doctor or pharmacy?: 1 - Never  Interpreter Needed?: No  Information entered by :: alia t/cma   Activities of Daily Living     03/11/2024   11:25 AM  In your present state of health, do you have any difficulty performing the following activities:  Hearing? 0  Vision? 0  Difficulty concentrating or making decisions? 0  Walking or climbing stairs? 0  Dressing or bathing? 0  Doing errands, shopping? 0  Preparing Food and eating ? N  Using the Toilet? N  In the past six months, have you accidently leaked urine? N  Do you have problems with loss of bowel control? N  Managing your Medications? N  Managing your Finances? N  Housekeeping or managing your Housekeeping? N    Patient Care Team: Lavell Bari LABOR, FNP as PCP - General (Family Medicine) Billee Mliss BIRCH, [facility] as Pharmacist (Family Medicine) Vicci Mcardle, OD (Optometry)  I have updated your Care Teams any recent Medical Services you may have received from other providers in the past year.     Assessment:   This is a routine wellness examination for [place].  Hearing/Vision screen Hearing Screening - Comments:: Pt have hearing dif Vision Screening - Comments:: Pt wear glasses/pt goes [facility] In [place], [place]/last ov a yr   Goals Addressed   None    Depression Screen     03/11/2024   11:33 AM 03/04/2024    4:03 PM 12/04/2023   10:41 AM 07/27/2023   12:12 PM 06/05/2023   11:35 AM 11/20/2022   10:14 AM 06/06/2022   10:38 AM  PHQ 2/9 Scores  PHQ - 2 Score 0 0 0 0 0 0 0  PHQ- 9 Score 0 0 0 0 0 0 0    Fall Risk     03/11/2024  11:23 AM 03/04/2024    4:03 PM 07/27/2023   12:12 PM 06/05/2023   11:35 AM 11/20/2022   10:14 AM  Fall Risk   Falls in the past year? 0 0 0 0 0  Number falls in past yr: 0 0 0 0   Injury with Fall? 0 0 0 0   Risk for  fall due to : No Fall Risks No Fall Risks No Fall Risks No Fall Risks   Follow up Falls evaluation completed Falls evaluation completed Falls evaluation completed Falls evaluation completed;Education provided     MEDICARE RISK AT HOME:  Medicare Risk at Home Any stairs in or around the home?: Yes If so, are there any without handrails?: Yes Home free of loose throw rugs in walkways, pet beds, electrical cords, etc?: Yes Adequate lighting in your home to reduce risk of falls?: Yes Life alert?: No Use of a cane, walker or w/c?: No Grab bars in the bathroom?: Yes Shower chair or bench in shower?: No Elevated toilet seat or a handicapped toilet?: Yes  TIMED UP AND GO:  Was the test performed?  no  Cognitive Function: 6CIT completed    01/10/2018    9:34 AM  MMSE - Mini Mental State Exam  Orientation to time 5  Orientation to Place 5  Registration 3  Attention/ Calculation 5  Recall 2  Language- name 2 objects 2  Language- repeat 1  Language- follow 3 step command 3  Language- read & follow direction 1  Write a sentence 1  Copy design 1  Total score 29        03/11/2024   11:27 AM 06/06/2022   10:39 AM 06/02/2020    9:42 AM 06/02/2019    9:48 AM  6CIT Screen  What Year? 0 points 0 points 0 points 0 points  What month? 0 points 0 points 0 points 0 points  What time? 0 points 0 points 0 points 0 points  Count back from 20 0 points 0 points 0 points 0 points  Months in reverse 0 points 0 points 0 points 0 points  Repeat phrase 0 points 0 points 0 points 0 points  Total Score 0 points 0 points 0 points 0 points    Immunizations Immunization History  Administered Date(s) Administered   Fluad Quad(high Dose 65+) 03/15/2020   Moderna Sars-Covid-2 Vaccination 07/31/2019, 08/29/2019, 05/12/2020   Pneumococcal Conjugate-13 01/10/2018   Pneumococcal Polysaccharide-23 04/29/2019   Tdap 04/22/2018    Screening Tests Health Maintenance  Topic Date Due   Zoster Vaccines-  Shingrix (1 of 2) Never done   OPHTHALMOLOGY EXAM  07/27/2023   Colonoscopy  06/16/2024   Influenza Vaccine  09/16/2024 (Originally 01/18/2024)   COVID-19 Vaccine (4 - 2025-26 season) 03/20/2025 (Originally 02/18/2024)   Diabetic kidney evaluation - eGFR measurement  06/04/2024   Diabetic kidney evaluation - Urine ACR  06/04/2024   FOOT EXAM  06/04/2024   HEMOGLOBIN A1C  06/04/2024   Medicare Annual Wellness (AWV)  03/11/2025   DTaP/Tdap/Td (2 - Td or Tdap) 04/22/2028   Pneumococcal Vaccine: 50+ Years  Completed   Hepatitis C Screening  Completed   HPV VACCINES  Aged Out   Meningococcal B Vaccine  Aged Out    Health Maintenance Items Addressed: See Nurse Notes at the end of this note  Additional Screening:  Vision Screening: Recommended annual ophthalmology exams for early detection of glaucoma and other disorders of the eye. Is the patient up to date with their annual eye  exam?  Yes  Who is the provider or what is the name of the office in which the patient attends annual eye exams? [facility]- suggest pt make an updated vision exam for this yr.   Dental Screening: Recommended annual dental exams for proper oral hygiene  Community Resource Referral / Chronic Care Management: CRR required this visit?  No   CCM required this visit?  No   Plan:    I have personally reviewed and noted the following in the patient's chart:   Medical and social history Use of alcohol, tobacco or illicit drugs  Current medications and supplements including opioid prescriptions. Patient is not currently taking opioid prescriptions. Functional ability and status Nutritional status Physical activity Advanced directives List of other physicians Hospitalizations, surgeries, and ER visits in previous 12 months Vitals Screenings to include cognitive, depression, and falls Referrals and appointments  In addition, I have reviewed and discussed with patient certain preventive protocols,  quality metrics, and best practice recommendations. A written personalized care plan for preventive services as well as general preventive health recommendations were provided to patient.   Ozie Ned, CMA   03/11/2024   After Visit Summary: (MyChart) Due to this being a telephonic visit, the after visit summary with patients personalized plan was offered to patient via MyChart   Notes: PCP Follow Up Recommendations: Pt is aware and due the following: Diabetic Eye exam, Colonoscopy

## 2024-03-11 NOTE — Patient Instructions (Signed)
 Gregory Malone,  Thank you for taking the time for your Medicare Wellness Visit. I appreciate your continued commitment to your health goals. Please review the care plan we discussed, and feel free to reach out if I can assist you further.  Medicare recommends these wellness visits once per year to help you and your care team stay ahead of potential health issues. These visits are designed to focus on prevention, allowing your provider to concentrate on managing your acute and chronic conditions during your regular appointments.  Please note that Annual Wellness Visits do not include a physical exam. Some assessments may be limited, especially if the visit was conducted virtually. If needed, we may recommend a separate in-person follow-up with your provider.  Ongoing Care Seeing your primary care provider every 3 to 6 months helps us  monitor your health and provide consistent, personalized care.   Referrals If a referral was made during today's visit and you haven't received any updates within two weeks, please contact the referred provider directly to check on the status.  Recommended Screenings:  Health Maintenance  Topic Date Due   Zoster (Shingles) Vaccine (1 of 2) Never done   Medicare Annual Wellness Visit  06/07/2023   Eye exam for diabetics  07/27/2023   Colon Cancer Screening  06/16/2024   Flu Shot  09/16/2024*   COVID-19 Vaccine (4 - 2025-26 season) 03/20/2025*   Yearly kidney function blood test for diabetes  06/04/2024   Yearly kidney health urinalysis for diabetes  06/04/2024   Complete foot exam   06/04/2024   Hemoglobin A1C  06/04/2024   DTaP/Tdap/Td vaccine (2 - Td or Tdap) 04/22/2028   Pneumococcal Vaccine for age over 27  Completed   Hepatitis C Screening  Completed   HPV Vaccine  Aged Out   Meningitis B Vaccine  Aged Out  *Topic was postponed. The date shown is not the original due date.       03/11/2024   11:26 AM  Advanced Directives  Does Patient Have a  Medical Advance Directive? Yes  Type of Estate agent of [place];Living will   Advance Care Planning is important because it: Ensures you receive medical care that aligns with your values, goals, and preferences. Provides guidance to your family and loved ones, reducing the emotional burden of decision-making during critical moments.  Vision: Annual vision screenings are recommended for early detection of glaucoma, cataracts, and diabetic retinopathy. These exams can also reveal signs of chronic conditions such as diabetes and high blood pressure.  Dental: Annual dental screenings help detect early signs of oral cancer, gum disease, and other conditions linked to overall health, including heart disease and diabetes.  Please see the attached documents for additional preventive care recommendations.

## 2024-03-18 ENCOUNTER — Other Ambulatory Visit: Payer: Self-pay | Admitting: Family

## 2024-03-18 MED ORDER — COLCHICINE 0.6 MG PO TABS: ORAL_TABLET | ORAL | 2 refills | Status: DC

## 2024-03-18 NOTE — Telephone Encounter (Unsigned)
 Copied from CRM #[id number]. Topic: Clinical - Medication Refill >> Mar 18, 2024 11:50 AM Willma R wrote: Medication: colchicine  0.6 MG tablet  Has the patient contacted their pharmacy? Yes, call dr (Patient will be in [place] for 2 months so needs medication sent there)  This is the patient's preferred pharmacy:  [facility] [address] [place] - [address] [place] [place] [address] Phone: [phone] Fax: [phone]  Is this the correct pharmacy for this prescription? Yes If no, delete pharmacy and type the correct one.   Has the prescription been filled recently? No  Is the patient out of the medication? No  Has the patient been seen for an appointment in the last year OR does the patient have an upcoming appointment? Yes  Can we respond through MyChart? No  Agent: Please be advised that Rx refills may take up to 3 business days. We ask that you follow-up with your pharmacy.

## 2024-04-07 ENCOUNTER — Telehealth: Payer: Self-pay | Admitting: Family

## 2024-04-07 NOTE — Telephone Encounter (Signed)
 Copied from CRM [id number]. Topic: Clinical - Medical Advice >> Apr 07, 2024 12:17 PM Tiffany B wrote: Reason for CRM: Patient developed an  irregular mole over the summer  on his right side of shoulder and states PCP has never seen this mole. Mole is raised but not painful to touch. Offered patient appointment in office or virtual and patient is currently in [place]  therefore unable to schedule appointment.    Patient would like to know if PCP would send in a referral in [facility] [place]  or does he need to be seen.

## 2024-04-07 NOTE — Telephone Encounter (Signed)
 Needs appt we can not send referrals out of state

## 2024-04-07 NOTE — Telephone Encounter (Signed)
 Spoke with pt and told him we could not send referrals out of state. I offered to make him appt when he came back to town but he said it would be December before he came back. He asked if he needs referral. I told him to call insurance company to check. I suggested he go to urgent care.

## 2024-06-05 ENCOUNTER — Ambulatory Visit (INDEPENDENT_AMBULATORY_CARE_PROVIDER_SITE_OTHER): Payer: Self-pay | Admitting: Family

## 2024-06-05 ENCOUNTER — Encounter: Payer: Self-pay | Admitting: Family

## 2024-06-05 VITALS — BP 115/72 | HR 90 | Temp 97.7°F | Ht 63.0 in | Wt 251.0 lb

## 2024-06-05 DIAGNOSIS — J4520 Mild intermittent asthma, uncomplicated: Secondary | ICD-10-CM

## 2024-06-05 DIAGNOSIS — Z0000 Encounter for general adult medical examination without abnormal findings: Secondary | ICD-10-CM

## 2024-06-05 DIAGNOSIS — M1A1720 Lead-induced chronic gout, left ankle and foot, without tophus (tophi): Secondary | ICD-10-CM | POA: Diagnosis not present

## 2024-06-05 DIAGNOSIS — I10 Essential (primary) hypertension: Secondary | ICD-10-CM

## 2024-06-05 DIAGNOSIS — N1831 Chronic kidney disease, stage 3a: Secondary | ICD-10-CM

## 2024-06-05 DIAGNOSIS — E1169 Type 2 diabetes mellitus with other specified complication: Secondary | ICD-10-CM

## 2024-06-05 DIAGNOSIS — Z0001 Encounter for general adult medical examination with abnormal findings: Secondary | ICD-10-CM

## 2024-06-05 DIAGNOSIS — I2510 Atherosclerotic heart disease of native coronary artery without angina pectoris: Secondary | ICD-10-CM | POA: Diagnosis not present

## 2024-06-05 DIAGNOSIS — Z6841 Body Mass Index (BMI) 40.0 and over, adult: Secondary | ICD-10-CM | POA: Diagnosis not present

## 2024-06-05 DIAGNOSIS — M51362 Other intervertebral disc degeneration, lumbar region with discogenic back pain and lower extremity pain: Secondary | ICD-10-CM

## 2024-06-05 DIAGNOSIS — E669 Obesity, unspecified: Secondary | ICD-10-CM

## 2024-06-05 DIAGNOSIS — E785 Hyperlipidemia, unspecified: Secondary | ICD-10-CM

## 2024-06-05 DIAGNOSIS — Z1211 Encounter for screening for malignant neoplasm of colon: Secondary | ICD-10-CM

## 2024-06-05 DIAGNOSIS — K219 Gastro-esophageal reflux disease without esophagitis: Secondary | ICD-10-CM

## 2024-06-05 LAB — BAYER DCA HB A1C WAIVED: HB A1C (BAYER DCA - WAIVED): 6.1 % — ABNORMAL HIGH (ref 4.8–5.6)

## 2024-06-05 MED ORDER — HYDROCHLOROTHIAZIDE 25 MG PO TABS: 25.0000 mg | ORAL_TABLET | Freq: Every day | ORAL | 1 refills | Status: AC

## 2024-06-05 MED ORDER — ATORVASTATIN CALCIUM 80 MG PO TABS: 80.0000 mg | ORAL_TABLET | Freq: Every day | ORAL | 1 refills | Status: AC

## 2024-06-05 MED ORDER — LISINOPRIL 40 MG PO TABS: 40.0000 mg | ORAL_TABLET | Freq: Every day | ORAL | 1 refills | Status: AC

## 2024-06-05 MED ORDER — METOPROLOL TARTRATE 25 MG PO TABS: 25.0000 mg | ORAL_TABLET | Freq: Two times a day (BID) | ORAL | 1 refills | Status: AC

## 2024-06-05 MED ORDER — ALLOPURINOL 100 MG PO TABS: 100.0000 mg | ORAL_TABLET | Freq: Every day | ORAL | 1 refills | Status: AC

## 2024-06-05 MED ORDER — AMLODIPINE BESYLATE 10 MG PO TABS: 10.0000 mg | ORAL_TABLET | Freq: Every day | ORAL | 1 refills | Status: AC

## 2024-06-05 NOTE — Progress Notes (Signed)
 Subjective:    Patient ID: Gregory Malone, male    DOB: [birth year], 73 y.o.   MRN: [id number]  Chief Complaint  Patient presents with   Medical Management of Chronic Issues   Pt presents to the office today for CPE.  He has hx of Prostate Cancer 2013.    He has benign pulmonary nodules that are stable, last CT chest scan was 04/2019.   He followed by Nephrologists for CKD annually.  Complaining of gout in left toe that comes and goes. Has had to take colchicine  45 times this year.  Hypertension This is a chronic problem. The current episode started more than 1 year ago. The problem has been resolved since onset. The problem is controlled. Pertinent negatives include no blurred vision, malaise/fatigue, peripheral edema or shortness of breath. Risk factors for coronary artery disease include dyslipidemia, diabetes mellitus, obesity, sedentary lifestyle and male gender. The current treatment provides moderate improvement.  Diabetes He presents for his follow-up diabetic visit. He has type 2 diabetes mellitus. Pertinent negatives for diabetes include no blurred vision and no foot paresthesias. Risk factors for coronary artery disease include diabetes mellitus, dyslipidemia, hypertension, sedentary lifestyle, obesity and male sex. He is following a generally healthy diet. His overall blood glucose range is 110-130 mg/dl. Eye exam is current.  Back Pain This is a chronic problem. The current episode started more than 1 year ago. The problem occurs intermittently. The problem has been waxing and waning since onset. The pain is present in the lumbar spine. The quality of the pain is described as aching. The pain is at a severity of 0/10. The patient is experiencing no pain. The symptoms are aggravated by standing. Risk factors include obesity. He has tried bed rest for the symptoms. The treatment provided mild relief.  Hyperlipidemia This is a chronic problem. The current episode started more  than 1 year ago. The problem is controlled. Exacerbating diseases include obesity. Pertinent negatives include no shortness of breath. Current antihyperlipidemic treatment includes statins. The current treatment provides moderate improvement of lipids. Risk factors for coronary artery disease include dyslipidemia, hypertension, male sex, a sedentary lifestyle and obesity.       Review of Systems  Constitutional:  Negative for malaise/fatigue.  Eyes:  Negative for blurred vision.  Respiratory:  Negative for shortness of breath.   Musculoskeletal:  Positive for back pain.  All other systems reviewed and are negative.  Family History  Problem Relation Age of Onset   Hypertension Mother    Prostate cancer Father 33   Hypertension Father    Hyperlipidemia Father    Uterine cancer Sister    Diabetes Sister    Colon polyps Maternal Aunt    Colon cancer Maternal Aunt    Prostate cancer Paternal Uncle    Prostate cancer Maternal Grandfather 43   Prostate cancer Cousin    Colon cancer Cousin        x 2   Stomach cancer Neg Hx    Rectal cancer Neg Hx    Pancreatic cancer Neg Hx    Esophageal cancer Neg Hx    Social History   Socioeconomic History   Marital status: Divorced    Spouse name: Not on file   Number of children: 0   Years of education: 16   Highest education level: Bachelor's degree (e.g., BA, AB, BS)  Occupational History   Occupation: retired/[employer]    Comment: Retired Chemical Engineer  Tobacco Use   Smoking status:  Former    Current packs/day: 0.00    Average packs/day: 1 pack/day for 10.0 years (10.0 ttl pk-yrs)    Types: Cigarettes    Start date: 06/19/1982    Quit date: 06/19/1992    Years since quitting: 31.9   Smokeless tobacco: Never   Tobacco comments:    quit smoking in 1994  Vaping Use   Vaping status: Never Used  Substance and Sexual Activity   Alcohol use: Yes    Alcohol/week: 2.0 standard drinks of alcohol    Types: 2 Cans of beer per week    Drug use: No   Sexual activity: Yes  Other Topics Concern   Not on file  Social History Narrative   Lives alone   Enjoys hiking and metal detector hunting   Changed to a Paleo Diet mid 2022 and no longer has diabetes   Social Drivers of Health   Tobacco Use: Medium Risk (06/05/2024)   Patient History    Smoking Tobacco Use: Former    Smokeless Tobacco Use: Never    Passive Exposure: Not on file  Financial Resource Strain: Low Risk (03/11/2024)   Overall Financial Resource Strain (CARDIA)    Difficulty of Paying Living Expenses: Not hard at all  Food Insecurity: No Food Insecurity (03/11/2024)   Epic    Worried About Radiation Protection Practitioner of Food in the Last Year: Never true    Ran Out of Food in the Last Year: Never true  Transportation Needs: No Transportation Needs (03/11/2024)   Epic    Lack of Transportation (Medical): No    Lack of Transportation (Non-Medical): No  Physical Activity: Sufficiently Active (03/11/2024)   Exercise Vital Sign    Days of Exercise per Week: 5 days    Minutes of Exercise per Session: 40 min  Stress: No Stress Concern Present (03/11/2024)   [employer] of Occupational Health - Occupational Stress Questionnaire    Feeling of Stress: Not at all  Social Connections: Moderately Integrated (03/11/2024)   Social Connection and Isolation Panel    Frequency of Communication with Friends and Family: More than three times a week    Frequency of Social Gatherings with Friends and Family: More than three times a week    Attends Religious Services: 1 to 4 times per year    Active Member of Clubs or Organizations: Yes    Attends Banker Meetings: 1 to 4 times per year    Marital Status: Divorced  Depression (PHQ2-9): Low Risk (06/05/2024)   Depression (PHQ2-9)    PHQ-2 Score: 0  Alcohol Screen: Low Risk (03/11/2024)   Alcohol Screen    Last Alcohol Screening Score (AUDIT): 0  Housing: Unknown (03/11/2024)   Epic    Unable to Pay for Housing in the  Last Year: No    Number of Times Moved in the Last Year: Not on file    Homeless in the Last Year: No  Utilities: Not At Risk (03/11/2024)   Epic    Threatened with loss of utilities: No  Health Literacy: Adequate Health Literacy (03/11/2024)   B1300 Health Literacy    Frequency of need for help with medical instructions: Never       Objective:   Physical Exam Vitals reviewed.  Constitutional:      General: He is not in acute distress.    Appearance: He is well-developed. He is obese.  HENT:     Head: Normocephalic.     Right Ear: Tympanic membrane normal.  Left Ear: Tympanic membrane normal.  Eyes:     General:        Right eye: No discharge.        Left eye: No discharge.     Pupils: Pupils are equal, round, and reactive to light.  Neck:     Thyroid : No thyromegaly.  Cardiovascular:     Rate and Rhythm: Normal rate and regular rhythm.     Heart sounds: Normal heart sounds. No murmur heard. Pulmonary:     Effort: Pulmonary effort is normal. No respiratory distress.     Breath sounds: Normal breath sounds. No wheezing.  Abdominal:     General: Bowel sounds are normal. There is no distension.     Palpations: Abdomen is soft.     Tenderness: There is no abdominal tenderness.  Musculoskeletal:        General: No swelling or tenderness. Normal range of motion.     Cervical back: Normal range of motion and neck supple.  Skin:    General: Skin is warm and dry.     Findings: No erythema or rash.  Neurological:     Mental Status: He is alert and oriented to person, place, and time.     Cranial Nerves: No cranial nerve deficit.     Deep Tendon Reflexes: Reflexes are normal and symmetric.  Psychiatric:        Behavior: Behavior normal.        Thought Content: Thought content normal.        Judgment: Judgment normal.       BP 115/72   Pulse 90   Temp 97.7 F (36.5 C) (Temporal)   Ht 5' 3 (1.6 m)   Wt 251 lb (113.9 kg)   BMI 44.46 kg/m      Assessment & Plan:    Gregory Malone comes in today with chief complaint of Medical Management of Chronic Issues   Diagnosis and orders addressed: 1. Essential hypertension - CBC with Differential/Platelet - CMP14+EGFR - amLODipine  (NORVASC ) 10 MG tablet; Take 1 tablet (10 mg total) by mouth daily.  Dispense: 90 tablet; Refill: 1 - hydrochlorothiazide  (HYDRODIURIL ) 25 MG tablet; Take 1 tablet (25 mg total) by mouth daily.  Dispense: 90 tablet; Refill: 1 - lisinopril  (ZESTRIL ) 40 MG tablet; Take 1 tablet (40 mg total) by mouth daily.  Dispense: 90 tablet; Refill: 1 - metoprolol  tartrate (LOPRESSOR ) 25 MG tablet; Take 1 tablet (25 mg total) by mouth 2 (two) times daily.  Dispense: 180 tablet; Refill: 1  2. Hyperlipidemia, unspecified hyperlipidemia type  - CBC with Differential/Platelet - Lipid panel - CMP14+EGFR - atorvastatin  (LIPITOR) 80 MG tablet; Take 1 tablet (80 mg total) by mouth daily.  Dispense: 90 tablet; Refill: 1  3. Coronary artery disease involving native heart without angina pectoris, unspecified vessel or lesion type - CBC with Differential/Platelet - CMP14+EGFR - metoprolol  tartrate (LOPRESSOR ) 25 MG tablet; Take 1 tablet (25 mg total) by mouth 2 (two) times daily.  Dispense: 180 tablet; Refill: 1  4. Mild intermittent asthma without complication - CBC with Differential/Platelet - CMP14+EGFR  5. BMI 45.0-49.9, adult (HCC) - CBC with Differential/Platelet - CMP14+EGFR  6. Stage 3a chronic kidney disease (HCC)  - CBC with Differential/Platelet - CMP14+EGFR  7. Degeneration of intervertebral disc of lumbar region with discogenic back pain and lower extremity pain - CBC with Differential/Platelet - CMP14+EGFR  8. Gastroesophageal reflux disease, unspecified whether esophagitis present  - CBC with Differential/Platelet - CMP14+EGFR  9. Morbid obesity (HCC)  -  CBC with Differential/Platelet - CMP14+EGFR  10. Type 2 diabetes mellitus in patient with obesity (HCC) -  Bayer DCA Hb A1c Waived - CBC with Differential/Platelet - CMP14+EGFR - Microalbumin / creatinine urine ratio - TSH - Vitamin B12  11. Annual physical exam (Primary) - Bayer DCA Hb A1c Waived - CBC with Differential/Platelet - Lipid panel - CMP14+EGFR - Microalbumin / creatinine urine ratio - TSH - Vitamin B12 - PSA, total and free  12. Colon cancer screening  - CBC with Differential/Platelet - CMP14+EGFR - Ambulatory referral to Gastroenterology  13. Chronic lead-induced gout involving toe of left foot without tophus, sequela Start allopurinol  100 mg daily Force fluids - allopurinol  (ZYLOPRIM ) 100 MG tablet; Take 1 tablet (100 mg total) by mouth daily.  Dispense: 90 tablet; Refill: 1   Labs pending Continue current medications  Start allopurinol  100 mg  Health Maintenance reviewed Diet and exercise encouraged  Follow up plan: 3 months    Bari Learn, FNP

## 2024-06-05 NOTE — Patient Instructions (Addendum)
Gout  Gout is a condition that causes painful swelling of the joints. Gout is a type of inflammation of the joints (arthritis). This condition is caused by having too much uric acid in the body. Uric acid is a chemical that forms when the body breaks down substances called purines. Purines are important for building body proteins. When the body has too much uric acid, sharp crystals can form and build up inside the joints. This causes pain and swelling. Gout attacks can happen quickly and may be very painful (acute gout). Over time, the attacks can affect more joints and become more frequent (chronic gout). Gout can also cause uric acid to build up under the skin and inside the kidneys. What are the causes? This condition is caused by too much uric acid in your blood. This can happen because: Your kidneys do not remove enough uric acid from your blood. This is the most common cause. Your body makes too much uric acid. This can happen with some cancers and cancer treatments. It can also occur if your body is breaking down too many red blood cells (hemolytic anemia). You eat too many foods that are high in purines. These foods include organ meats and some seafood. Alcohol, especially beer, is also high in purines. A gout attack may be triggered by trauma or stress. What increases the risk? The following factors may make you more likely to develop this condition: Having a family history of gout. Being male and middle-aged. Being male and having gone through menopause. Taking certain medicines, including aspirin, cyclosporine, diuretics, levodopa, and niacin. Having an organ transplant. Having certain conditions, such as: Being obese. Lead poisoning. Kidney disease. A skin condition called psoriasis. Other factors include: Losing weight too quickly. Being dehydrated. Frequently drinking alcohol, especially beer. Frequently drinking beverages that are sweetened with a type of sugar called  fructose. What are the signs or symptoms? An attack of acute gout happens quickly. It usually occurs in just one joint. The most common place is the big toe. Attacks often start at night. Other joints that may be affected include joints of the feet, ankle, knee, fingers, wrist, or elbow. Symptoms of this condition may include: Severe pain. Warmth. Swelling. Stiffness. Tenderness. The affected joint may be very painful to touch. Shiny, red, or purple skin. Chills and fever. Chronic gout may cause symptoms more frequently. More joints may be involved. You may also have white or yellow lumps (tophi) on your hands or feet or in other areas near your joints. How is this diagnosed? This condition is diagnosed based on your symptoms, your medical history, and a physical exam. You may have tests, such as: Blood tests to measure uric acid levels. Removal of joint fluid with a thin needle (aspiration) to look for uric acid crystals. X-rays to look for joint damage. How is this treated? Treatment for this condition has two phases: treating an acute attack and preventing future attacks. Acute gout treatment may include medicines to reduce pain and swelling, including: NSAIDs, such as ibuprofen. Steroids. These are strong anti-inflammatory medicines that can be taken by mouth (orally) or injected into a joint. Colchicine. This medicine relieves pain and swelling when it is taken soon after an attack. It can be given by mouth or through an IV. Preventive treatment may include: Daily use of smaller doses of NSAIDs or colchicine. Use of a medicine that reduces uric acid levels in your blood, such as allopurinol. Changes to your diet. You may need to see   a dietitian about what to eat and drink to prevent gout. Follow these instructions at home: During a gout attack  If directed, put ice on the affected area. To do this: Put ice in a plastic bag. Place a towel between your skin and the bag. Leave the  ice on for 20 minutes, 2-3 times a day. Remove the ice if your skin turns bright red. This is very important. If you cannot feel pain, heat, or cold, you have a greater risk of damage to the area. Raise (elevate) the affected joint above the level of your heart as often as possible. Rest the joint as much as possible. If the affected joint is in your leg, you may be given crutches to use. Follow instructions from your health care provider about eating or drinking restrictions. Avoiding future gout attacks Follow a low-purine diet as told by your dietitian or health care provider. Avoid foods and drinks that are high in purines, including liver, kidney, anchovies, asparagus, herring, mushrooms, mussels, and beer. Maintain a healthy weight or lose weight if you are overweight. If you want to lose weight, talk with your health care provider. Do not lose weight too quickly. Start or maintain an exercise program as told by your health care provider. Eating and drinking Avoid drinking beverages that contain fructose. Drink enough fluids to keep your urine pale yellow. If you drink alcohol: Limit how much you have to: 0-1 drink a day for women who are not pregnant. 0-2 drinks a day for men. Know how much alcohol is in a drink. In the [place] one drink equals one 12 oz bottle of beer (355 mL), one 5 oz glass of wine (148 mL), or one 1 oz glass of hard liquor (44 mL). General instructions Take over-the-counter and prescription medicines only as told by your health care provider. Ask your health care provider if the medicine prescribed to you requires you to avoid driving or using machinery. Return to your normal activities as told by your health care provider. Ask your health care provider what activities are safe for you. Keep all follow-up visits. This is important. Where to find more information National Institutes of Health: www.niams.nih.gov Contact a health care provider if you have: Another  gout attack. Continuing symptoms of a gout attack after 10 days of treatment. Side effects from your medicines. Chills or a fever. Burning pain when you urinate. Pain in your lower back or abdomen. Get help right away if you: Have severe or uncontrolled pain. Cannot urinate. Summary Gout is painful swelling of the joints caused by having too much uric acid in the body. The most common site for gout to occur is in the big toe, but it can affect other joints in the body. Medicines and dietary changes can help to prevent and treat gout attacks. This information is not intended to replace advice given to you by your health care provider. Make sure you discuss any questions you have with your health care provider. Document Revised: 03/09/2021 Document Reviewed: 03/09/2021 Elsevier Patient Education  2024 Elsevier Inc.  

## 2024-06-06 ENCOUNTER — Ambulatory Visit: Payer: Self-pay | Admitting: Family

## 2024-06-06 LAB — CBC WITH DIFFERENTIAL/PLATELET
Basophils Absolute: 0 x10E3/uL (ref 0.0–0.2)
Basos: 0 %
EOS (ABSOLUTE): 0.2 x10E3/uL (ref 0.0–0.4)
Eos: 2 %
Hematocrit: 50.3 % (ref 37.5–51.0)
Hemoglobin: 17.3 g/dL (ref 13.0–17.7)
Immature Grans (Abs): 0 x10E3/uL (ref 0.0–0.1)
Immature Granulocytes: 0 %
Lymphocytes Absolute: 1.7 x10E3/uL (ref 0.7–3.1)
Lymphs: 23 %
MCH: 32.5 pg (ref 26.6–33.0)
MCHC: 34.4 g/dL (ref 31.5–35.7)
MCV: 94 fL (ref 79–97)
Monocytes Absolute: 0.6 x10E3/uL (ref 0.1–0.9)
Monocytes: 8 %
Neutrophils Absolute: 4.8 x10E3/uL (ref 1.4–7.0)
Neutrophils: 67 %
Platelets: 209 x10E3/uL (ref 150–450)
RBC: 5.33 x10E6/uL (ref 4.14–5.80)
RDW: 13.5 % (ref 11.6–15.4)
WBC: 7.2 x10E3/uL (ref 3.4–10.8)

## 2024-06-06 LAB — PSA, TOTAL AND FREE
PSA, Free Pct: 26.7 %
PSA, Free: 0.08 ng/mL
Prostate Specific Ag, Serum: 0.3 ng/mL (ref 0.0–4.0)

## 2024-06-06 LAB — LIPID PANEL
Chol/HDL Ratio: 3.7 ratio (ref 0.0–5.0)
Cholesterol, Total: 119 mg/dL (ref 100–199)
HDL: 32 mg/dL — ABNORMAL LOW
LDL Chol Calc (NIH): 64 mg/dL (ref 0–99)
Triglycerides: 127 mg/dL (ref 0–149)
VLDL Cholesterol Cal: 23 mg/dL (ref 5–40)

## 2024-06-06 LAB — MICROALBUMIN / CREATININE URINE RATIO
Creatinine, Urine: 76.9 mg/dL
Microalb/Creat Ratio: <4 mg/g{creat} (ref 0–29)
Microalbumin, Urine: <3 ug/mL

## 2024-06-06 LAB — CMP14+EGFR
ALT: 25 IU/L (ref 0–44)
AST: 25 IU/L (ref 0–40)
Albumin: 4.3 g/dL (ref 3.8–4.8)
Alkaline Phosphatase: 102 IU/L (ref 47–123)
BUN/Creatinine Ratio: 18 (ref 10–24)
BUN: 26 mg/dL (ref 8–27)
Bilirubin Total: 0.6 mg/dL (ref 0.0–1.2)
CO2: 25 mmol/L (ref 20–29)
Calcium: 9.7 mg/dL (ref 8.6–10.2)
Chloride: 102 mmol/L (ref 96–106)
Creatinine, Ser: 1.44 mg/dL — ABNORMAL HIGH (ref 0.76–1.27)
Globulin, Total: 2.5 g/dL (ref 1.5–4.5)
Glucose: 120 mg/dL — ABNORMAL HIGH (ref 70–99)
Potassium: 3.6 mmol/L (ref 3.5–5.2)
Sodium: 143 mmol/L (ref 134–144)
Total Protein: 6.8 g/dL (ref 6.0–8.5)
eGFR: 52 mL/min/1.73 — ABNORMAL LOW

## 2024-06-06 LAB — VITAMIN B12: Vitamin B-12: 296 pg/mL (ref 232–1245)

## 2024-06-06 LAB — TSH: TSH: 2.19 u[IU]/mL (ref 0.450–4.500)

## 2024-06-10 ENCOUNTER — Encounter (INDEPENDENT_AMBULATORY_CARE_PROVIDER_SITE_OTHER): Payer: Self-pay | Admitting: *Deleted

## 2024-06-18 ENCOUNTER — Other Ambulatory Visit: Payer: Self-pay | Admitting: Family

## 2024-06-23 ENCOUNTER — Telehealth: Payer: Self-pay | Admitting: *Deleted

## 2024-06-23 DIAGNOSIS — I2510 Atherosclerotic heart disease of native coronary artery without angina pectoris: Secondary | ICD-10-CM

## 2024-06-23 DIAGNOSIS — I10 Essential (primary) hypertension: Secondary | ICD-10-CM

## 2024-06-23 DIAGNOSIS — N183 Chronic kidney disease, stage 3 unspecified: Secondary | ICD-10-CM

## 2024-06-23 NOTE — Telephone Encounter (Signed)
" °  Procedure: COLONOSCOPY  Estimated body mass index is 44.46 kg/m as calculated from the following:   Height as of 06/05/24: 5' 3 (1.6 m).   Weight as of 06/05/24: 251 lb (113.9 kg).  Have you had a colonoscopy before?  05/2021, [facility] Dr. Legrand  Do you have family history of colon cancer?  Aunt, cousins  Do you have a family history of polyps? yes  Previous colonoscopy with polyps removed? yes  Do you have a history colorectal cancer?   no  Are you diabetic?  no  Do you have a prosthetic or mechanical heart valve? no  Do you have a pacemaker/defibrillator?   no  Have you had endocarditis/atrial fibrillation?  no  Do you use supplemental oxygen/CPAP?  no  Have you had joint replacement within the last 12 months?  no  Do you tend to be constipated or have to use laxatives?  no   Do you have history of alcohol use? If yes, how much and how often.  1 beer per week  Do you have history or are you using drugs? If yes, what do are you  using?  no  Have you ever had a stroke/heart attack?  no  Have you ever had a heart or other vascular stent placed,?no  Do you take weight loss medication? no   Do you take any blood-thinning medications such as: (Plavix, aspirin , Coumadin, Aggrenox, Brilinta, Xarelto, Eliquis, Pradaxa, Savaysa or Effient)? no  If yes we need the name, milligram, dosage and who is prescribing doctor:               Current Outpatient Medications  Medication Sig Dispense Refill   acetaminophen  (TYLENOL ) 500 MG tablet Take 500 mg by mouth every 6 (six) hours as needed.     allopurinol  (ZYLOPRIM ) 100 MG tablet Take 1 tablet (100 mg total) by mouth daily. 90 tablet 1   amLODipine  (NORVASC ) 10 MG tablet Take 1 tablet (10 mg total) by mouth daily. 90 tablet 1   atorvastatin  (LIPITOR) 80 MG tablet Take 1 tablet (80 mg total) by mouth daily. 90 tablet 1   blood glucose meter kit and supplies Dispense based on patient and insurance preference. Use up to four times  daily as directed. (FOR ICD-10 E10.9, E11.9). 1 each 0   colchicine  0.6 MG tablet TAKE 2 TABS IMMEDIATELY, THEN 1 TAB TWICE PER DAY FOR THE DURATION OF THE FLARE - MAX OF 7 DAYS 21 tablet 2   hydrochlorothiazide  (HYDRODIURIL ) 25 MG tablet Take 1 tablet (25 mg total) by mouth daily. 90 tablet 1   lisinopril  (ZESTRIL ) 40 MG tablet Take 1 tablet (40 mg total) by mouth daily. 90 tablet 1   metoprolol  tartrate (LOPRESSOR ) 25 MG tablet Take 1 tablet (25 mg total) by mouth 2 (two) times daily. 180 tablet 1   Multiple Vitamin (MULTIVITAMIN ADULT PO) Take by mouth.     ONETOUCH VERIO test strip USE TO CHECK SUGAR UP TO 4 TIMES DAILY ICD-10 E10.9, E11.9 100 strip 1   potassium chloride  SA (KLOR-CON  M) 20 MEQ tablet TAKE 1 TABLET BY MOUTH EVERY DAY 90 tablet 1   sildenafil  (VIAGRA ) 100 MG tablet TAKE 0.5-1 TABLETS BY MOUTH DAILY AS NEEDED FOR ERECTILE DYSFUNCTION. 30 tablet 2   No current facility-administered medications for this visit.    Allergies  Allergen Reactions   Nsaids     Told not to take because of CKD     "

## 2024-06-27 NOTE — Progress Notes (Signed)
 MARKEE MATERA                                          MRN: [id number]   06/27/2024   The [facility] Quality Team Specialist reviewed this patient medical record for the purposes of chart review for care gap closure. The following were reviewed: abstraction for care gap closure-kidney health evaluation for diabetes:eGFR  and uACR.    [facility] Quality Team

## 2024-06-29 NOTE — Telephone Encounter (Signed)
 ASA 3  (room 3 only) - PCP clearance for procedure (hx of CAD but not seen by cardiology)

## 2024-06-30 NOTE — Telephone Encounter (Signed)
" °  Request for patient needing cleareance  06/30/2024  Gregory Malone 1951-05-19  What type of surgery is being performed? COLONOSCOPY  When is surgery scheduled? TBD  What type of clearance is required (medical or pharmacy to hold medication or both? MEDICATION  Name of physician performing surgery?  [facility] [employer]: [phone], option 5 Fax: [phone]  Anesthesia type (none, local, MAC, general)? MAC    "

## 2024-07-01 NOTE — Addendum Note (Signed)
 Addended by: LAVELL LYE A on: 07/01/2024 01:40 PM   Modules accepted: Orders

## 2024-07-01 NOTE — Telephone Encounter (Signed)
 Medically cleared for colonoscopy, will need to follow up with Cardiologists. Referral pending.

## 2024-07-03 ENCOUNTER — Encounter (INDEPENDENT_AMBULATORY_CARE_PROVIDER_SITE_OTHER): Payer: Self-pay | Admitting: *Deleted

## 2024-07-03 MED ORDER — PEG 3350-KCL-NA BICARB-NACL 420 G PO SOLR: 4000.0000 mL | Freq: Once | ORAL | 0 refills | Status: AC

## 2024-07-03 NOTE — Addendum Note (Signed)
 Addended by: JEANELL GRAEME RAMAN on: 07/03/2024 08:18 AM   Modules accepted: Orders

## 2024-07-03 NOTE — Telephone Encounter (Signed)
 Referral completed, [facility] apt letter sent to PCP

## 2024-07-03 NOTE — Telephone Encounter (Signed)
Called pt and she is aware of pre-op appt details

## 2024-07-03 NOTE — Telephone Encounter (Signed)
 Spoke with pt. He has been scheduled with Dr. Shaaron 07/16/24. Aware he will have to have in person pre-op appointment and will call back once scheduled. Instructions mailed and rx sent to pharmacy for his prep.

## 2024-07-09 ENCOUNTER — Telehealth: Payer: Self-pay | Admitting: Family

## 2024-07-09 NOTE — Telephone Encounter (Unsigned)
 Copied from CRM #[id number]. Topic: Appointments - Scheduling Inquiry for Clinic >> Jul 09, 2024  9:06 AM Harlene ORN wrote: Reason for CRM: Patient called about his upcoming Cardiology appointment with Dr. Dorn Ross. Needs more information on the reason why he is scheduled for an appointment. Insists he is not Diabetic, nor does he have any Kidney disease. Please call back the patient to discuss his diagnosis before his appointment with Dr. Ross on 03/16 @ 10am.

## 2024-07-09 NOTE — Telephone Encounter (Signed)
 Tried to call patient, but could not leave message. We did not place referral to cardiology. Could maybe referred there because he is going to a have a surgery soon and they needed a cardiac clearance, but this office did not place a referral to them.

## 2024-07-10 NOTE — Patient Instructions (Signed)
 "       Gregory Malone  07/10/2024     @[email or web address] @   Your procedure is scheduled on  07/16/2024.   [facility] to [facility] at  0715  A.M.   Call this number if you have problems the morning of surgery:  [phone]  If you experience any cold or flu symptoms such as cough, fever, chills, shortness of breath, etc. between now and your scheduled surgery, please notify us  at the above number.   Remember:  Follow the diet and prep instructions given to you by the office.      Take these medicines the morning of surgery with A SIP OF WATER                    allopurinol ,amlodipine , metoprolol .    Do not wear jewelry, make-up or nail polish, including gel polish,  artificial nails, or any other type of covering on natural nails (fingers and  toes).  Do not wear lotions, powders, or perfumes, or deodorant.  Do not shave 48 hours prior to surgery.  Men may shave face and neck.  Do not bring valuables to the hospital.  [facility] is not responsible for any belongings or valuables.  Contacts, dentures or bridgework may not be worn into surgery.  Leave your suitcase in the car.  After surgery it may be brought to your room.  For patients admitted to the hospital, discharge time will be determined by your treatment team.  Patients discharged the day of surgery will not be allowed to drive home and must have someone with them for 24 hours.    Special instructions:   DO NOT smoke tobacco or vape for 24 hours before your procedure.  Please read over the following fact sheets that you were given. Anesthesia Post-op Instructions and Care and Recovery After Surgery      Colonoscopy, Adult, Care After The following information offers guidance on how to care for yourself after your procedure. Your health care provider may also give you more specific instructions. If you have problems or questions, contact your health care provider. What can I expect after the  procedure? After the procedure, it is common to have: A small amount of blood in your stool for 24 hours after the procedure. Some gas. Mild cramping or bloating of your abdomen. Follow these instructions at home: Eating and drinking  Drink enough fluid to keep your urine pale yellow. Follow instructions from your health care provider about eating or drinking restrictions. Resume your normal diet as told by your health care provider. Avoid heavy or fried foods that are hard to digest. Activity Rest as told by your health care provider. Avoid sitting for a long time without moving. Get up to take short walks every 1-2 hours. This is important to improve blood flow and breathing. Ask for help if you feel weak or unsteady. Return to your normal activities as told by your health care provider. Ask your health care provider what activities are safe for you. Managing cramping and bloating  Try walking around when you have cramps or feel bloated. If directed, apply heat to your abdomen as told by your health care provider. Use the heat source that your health care provider recommends, such as a moist heat pack or a heating pad. Place a towel between your skin and the heat source. Leave the heat on for 20-30 minutes. Remove the heat if your skin turns bright red. This is especially important  if you are unable to feel pain, heat, or cold. You have a greater risk of getting burned. General instructions If you were given a sedative during the procedure, it can affect you for several hours. Do not drive or operate machinery until your health care provider says that it is safe. For the first 24 hours after the procedure: Do not sign important documents. Do not drink alcohol. Do your regular daily activities at a slower pace than normal. Eat soft foods that are easy to digest. Take over-the-counter and prescription medicines only as told by your health care provider. Keep all follow-up visits. This  is important. Contact a health care provider if: You have blood in your stool 2-3 days after the procedure. Get help right away if: You have more than a small spotting of blood in your stool. You have large blood clots in your stool. You have swelling of your abdomen. You have nausea or vomiting. You have a fever. You have increasing pain in your abdomen that is not relieved with medicine. These symptoms may be an emergency. Get help right away. Call 911. Do not wait to see if the symptoms will go away. Do not drive yourself to the hospital. Summary After the procedure, it is common to have a small amount of blood in your stool. You may also have mild cramping and bloating of your abdomen. If you were given a sedative during the procedure, it can affect you for several hours. Do not drive or operate machinery until your health care provider says that it is safe. Get help right away if you have a lot of blood in your stool, nausea or vomiting, a fever, or increased pain in your abdomen. This information is not intended to replace advice given to you by your health care provider. Make sure you discuss any questions you have with your health care provider. Document Revised: 07/18/2022 Document Reviewed: 01/26/2021 Elsevier Patient Education  2024 [employer]General Anesthesia, Adult, Care After The following information offers guidance on how to care for yourself after your procedure. Your health care provider may also give you more specific instructions. If you have problems or questions, contact your health care provider. What can I expect after the procedure? After the procedure, it is common for people to: Have pain or discomfort at the IV site. Have nausea or vomiting. Have a sore throat or hoarseness. Have trouble concentrating. Feel cold or chills. Feel weak, sleepy, or tired (fatigue). Have soreness and body aches. These can affect parts of the body that were not involved in  surgery. Follow these instructions at home: For the time period you were told by your health care provider:  Rest. Do not participate in activities where you could fall or become injured. Do not drive or use machinery. Do not drink alcohol. Do not take sleeping pills or medicines that cause drowsiness. Do not make important decisions or sign legal documents. Do not take care of children on your own. General instructions Drink enough fluid to keep your urine pale yellow. If you have sleep apnea, surgery and certain medicines can increase your risk for breathing problems. Follow instructions from your health care provider about wearing your sleep device: Anytime you are sleeping, including during daytime naps. While taking prescription pain medicines, sleeping medicines, or medicines that make you drowsy. Return to your normal activities as told by your health care provider. Ask your health care provider what activities are safe for you. Take over-the-counter and prescription medicines only  as told by your health care provider. Do not use any products that contain nicotine or tobacco. These products include cigarettes, chewing tobacco, and vaping devices, such as e-cigarettes. These can delay incision healing after surgery. If you need help quitting, ask your health care provider. Contact a health care provider if: You have nausea or vomiting that does not get better with medicine. You vomit every time you eat or drink. You have pain that does not get better with medicine. You cannot urinate or have bloody urine. You develop a skin rash. You have a fever. Get help right away if: You have trouble breathing. You have chest pain. You vomit blood. These symptoms may be an emergency. Get help right away. Call 911. Do not wait to see if the symptoms will go away. Do not drive yourself to the hospital. Summary After the procedure, it is common to have a sore throat, hoarseness, nausea,  vomiting, or to feel weak, sleepy, or fatigue. For the time period you were told by your health care provider, do not drive or use machinery. Get help right away if you have difficulty breathing, have chest pain, or vomit blood. These symptoms may be an emergency. This information is not intended to replace advice given to you by your health care provider. Make sure you discuss any questions you have with your health care provider. Document Revised: 09/02/2021 Document Reviewed: 09/02/2021 Elsevier Patient Education  2024 [employer]. "

## 2024-07-14 ENCOUNTER — Encounter (HOSPITAL_COMMUNITY): Admission: RE | Admit: 2024-07-14 | Source: Ambulatory Visit

## 2024-07-15 ENCOUNTER — Encounter (HOSPITAL_COMMUNITY)
Admission: RE | Admit: 2024-07-15 | Discharge: 2024-07-15 | Disposition: A | Discharge status: Home / Self Care | Source: Ambulatory Visit | Attending: Internal Medicine | Admitting: Internal Medicine

## 2024-07-15 ENCOUNTER — Telehealth: Payer: Self-pay | Admitting: *Deleted

## 2024-07-15 ENCOUNTER — Other Ambulatory Visit: Payer: Self-pay | Admitting: Family

## 2024-07-15 DIAGNOSIS — I10 Essential (primary) hypertension: Secondary | ICD-10-CM

## 2024-07-15 NOTE — Telephone Encounter (Signed)
-----   Message from Nurse Randine MATSU, RN sent at 07/15/2024  9:03 AM EST ----- Regarding: Cancellation Patient called to cancel colonoscopy for 07/16/24 due to the weather. He will reschedule at later date and is requesting a call from the office.

## 2024-07-15 NOTE — Progress Notes (Signed)
 Patient called to cancel colonoscopy for 07/16/24 due to the weather.  He will reschedule at a later date.

## 2024-07-15 NOTE — Telephone Encounter (Signed)
 Pt has been rescheduled for 08/01/24. Went over instructions with dates and time changes in detail with pt. Will call with new pre-op appointment

## 2024-07-15 NOTE — Telephone Encounter (Signed)
 Pt informed that pre-op appointment is scheduled for Wednesday 07/30/24, arrive at 11:30 am to check in. Verbalized understanding.

## 2024-07-30 ENCOUNTER — Encounter (HOSPITAL_COMMUNITY)

## 2024-08-01 ENCOUNTER — Encounter (HOSPITAL_COMMUNITY): Admission: RE | Payer: Self-pay | Source: Home / Self Care

## 2024-08-01 ENCOUNTER — Ambulatory Visit (HOSPITAL_COMMUNITY): Admission: RE | Admit: 2024-08-01 | Source: Home / Self Care | Admitting: Internal Medicine

## 2024-09-01 ENCOUNTER — Ambulatory Visit: Admitting: Cardiology

## 2024-12-04 ENCOUNTER — Ambulatory Visit: Admitting: Family

## 2025-03-12 ENCOUNTER — Ambulatory Visit: Payer: Self-pay
